# Patient Record
Sex: Female | Born: 1985
Health system: Southern US, Community
[De-identification: ages and names within clinical notes are randomized; demographics above are authoritative.]

## PROBLEM LIST (undated history)

## (undated) ENCOUNTER — Inpatient Hospital Stay (HOSPITAL_COMMUNITY): Payer: Self-pay

## (undated) DIAGNOSIS — T1491XA Suicide attempt, initial encounter: Secondary | ICD-10-CM

## (undated) DIAGNOSIS — H469 Unspecified optic neuritis: Secondary | ICD-10-CM

## (undated) DIAGNOSIS — R87619 Unspecified abnormal cytological findings in specimens from cervix uteri: Secondary | ICD-10-CM

## (undated) DIAGNOSIS — R51 Headache: Secondary | ICD-10-CM

## (undated) DIAGNOSIS — F32A Depression, unspecified: Secondary | ICD-10-CM

## (undated) DIAGNOSIS — J40 Bronchitis, not specified as acute or chronic: Secondary | ICD-10-CM

## (undated) DIAGNOSIS — F419 Anxiety disorder, unspecified: Secondary | ICD-10-CM

## (undated) DIAGNOSIS — F329 Major depressive disorder, single episode, unspecified: Secondary | ICD-10-CM

## (undated) DIAGNOSIS — K219 Gastro-esophageal reflux disease without esophagitis: Secondary | ICD-10-CM

## (undated) DIAGNOSIS — F17200 Nicotine dependence, unspecified, uncomplicated: Secondary | ICD-10-CM

## (undated) DIAGNOSIS — F1411 Cocaine abuse, in remission: Secondary | ICD-10-CM

## (undated) DIAGNOSIS — O34219 Maternal care for unspecified type scar from previous cesarean delivery: Secondary | ICD-10-CM

## (undated) DIAGNOSIS — M1990 Unspecified osteoarthritis, unspecified site: Secondary | ICD-10-CM

## (undated) DIAGNOSIS — Z332 Encounter for elective termination of pregnancy: Secondary | ICD-10-CM

## (undated) HISTORY — PX: COLPOSCOPY: SHX161

## (undated) HISTORY — DX: Suicide attempt, initial encounter: T14.91XA

## (undated) HISTORY — DX: Maternal care for unspecified type scar from previous cesarean delivery: O34.219

## (undated) HISTORY — PX: WISDOM TOOTH EXTRACTION: SHX21

---
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
## 2005-01-24 ENCOUNTER — Emergency Department (HOSPITAL_COMMUNITY): Admission: EM | Admit: 2005-01-24 | Discharge: 2005-01-24 | Payer: Self-pay | Admitting: Emergency Medicine

## 2007-11-27 ENCOUNTER — Emergency Department (HOSPITAL_COMMUNITY): Admission: EM | Admit: 2007-11-27 | Discharge: 2007-11-27 | Payer: Self-pay | Admitting: Emergency Medicine

## 2008-06-05 ENCOUNTER — Ambulatory Visit (HOSPITAL_COMMUNITY): Admission: RE | Admit: 2008-06-05 | Discharge: 2008-06-05 | Payer: Self-pay | Admitting: Obstetrics and Gynecology

## 2008-06-26 ENCOUNTER — Ambulatory Visit (HOSPITAL_COMMUNITY): Admission: RE | Admit: 2008-06-26 | Discharge: 2008-06-26 | Payer: Self-pay | Admitting: Obstetrics and Gynecology

## 2008-07-09 ENCOUNTER — Inpatient Hospital Stay (HOSPITAL_COMMUNITY): Admission: AD | Admit: 2008-07-09 | Discharge: 2008-07-09 | Payer: Self-pay | Admitting: Obstetrics and Gynecology

## 2008-07-24 ENCOUNTER — Ambulatory Visit (HOSPITAL_COMMUNITY): Admission: RE | Admit: 2008-07-24 | Discharge: 2008-07-24 | Payer: Self-pay | Admitting: Obstetrics and Gynecology

## 2008-07-29 ENCOUNTER — Inpatient Hospital Stay (HOSPITAL_COMMUNITY): Admission: AD | Admit: 2008-07-29 | Discharge: 2008-07-29 | Payer: Self-pay | Admitting: Obstetrics and Gynecology

## 2008-07-31 ENCOUNTER — Inpatient Hospital Stay (HOSPITAL_COMMUNITY): Admission: RE | Admit: 2008-07-31 | Discharge: 2008-08-02 | Payer: Self-pay | Admitting: Obstetrics and Gynecology

## 2008-07-31 ENCOUNTER — Encounter (INDEPENDENT_AMBULATORY_CARE_PROVIDER_SITE_OTHER): Payer: Self-pay | Admitting: Obstetrics and Gynecology

## 2008-12-26 ENCOUNTER — Emergency Department (HOSPITAL_COMMUNITY): Admission: EM | Admit: 2008-12-26 | Discharge: 2008-12-26 | Payer: Self-pay | Admitting: Emergency Medicine

## 2009-01-28 ENCOUNTER — Emergency Department (HOSPITAL_COMMUNITY): Admission: EM | Admit: 2009-01-28 | Discharge: 2009-01-28 | Payer: Self-pay | Admitting: Emergency Medicine

## 2009-02-06 ENCOUNTER — Emergency Department (HOSPITAL_COMMUNITY): Admission: EM | Admit: 2009-02-06 | Discharge: 2009-02-06 | Payer: Self-pay | Admitting: Emergency Medicine

## 2010-06-13 ENCOUNTER — Emergency Department (HOSPITAL_COMMUNITY)
Admission: EM | Admit: 2010-06-13 | Discharge: 2010-06-13 | Payer: Self-pay | Source: Home / Self Care | Admitting: Family Medicine

## 2010-08-07 ENCOUNTER — Emergency Department (HOSPITAL_COMMUNITY)
Admission: EM | Admit: 2010-08-07 | Discharge: 2010-08-07 | Payer: Self-pay | Source: Home / Self Care | Admitting: Emergency Medicine

## 2010-08-07 LAB — CBC
HCT: 43.9 % (ref 36.0–46.0)
Hemoglobin: 15.7 g/dL — ABNORMAL HIGH (ref 12.0–15.0)
MCH: 30.1 pg (ref 26.0–34.0)
MCHC: 35.8 g/dL (ref 30.0–36.0)
MCV: 84.1 fL (ref 78.0–100.0)
Platelets: 191 10*3/uL (ref 150–400)
RBC: 5.22 MIL/uL — ABNORMAL HIGH (ref 3.87–5.11)
RDW: 13.2 % (ref 11.5–15.5)
WBC: 5.6 10*3/uL (ref 4.0–10.5)

## 2010-08-07 LAB — POCT INFECTIOUS MONO SCREEN: Mono Screen: NEGATIVE

## 2010-08-07 LAB — POCT RAPID STREP A (OFFICE): Streptococcus, Group A Screen (Direct): NEGATIVE

## 2010-08-08 LAB — DIFFERENTIAL
Basophils Absolute: 0 10*3/uL (ref 0.0–0.1)
Basophils Relative: 0 % (ref 0–1)
Eosinophils Absolute: 0.1 10*3/uL (ref 0.0–0.7)
Eosinophils Relative: 1 % (ref 0–5)
Lymphocytes Relative: 22 % (ref 12–46)
Lymphs Abs: 1.2 10*3/uL (ref 0.7–4.0)
Monocytes Absolute: 0.6 10*3/uL (ref 0.1–1.0)
Monocytes Relative: 11 % (ref 3–12)
Neutro Abs: 3.7 10*3/uL (ref 1.7–7.7)
Neutrophils Relative %: 66 % (ref 43–77)

## 2010-08-10 ENCOUNTER — Emergency Department (HOSPITAL_COMMUNITY)
Admission: EM | Admit: 2010-08-10 | Discharge: 2010-08-10 | Payer: Self-pay | Source: Home / Self Care | Admitting: Family Medicine

## 2010-11-10 LAB — URINALYSIS, ROUTINE W REFLEX MICROSCOPIC
Bilirubin Urine: NEGATIVE
Glucose, UA: NEGATIVE mg/dL
Ketones, ur: NEGATIVE mg/dL
Leukocytes, UA: NEGATIVE
Nitrite: NEGATIVE
Protein, ur: NEGATIVE mg/dL
Specific Gravity, Urine: 1.013 (ref 1.005–1.030)
Urobilinogen, UA: 0.2 mg/dL (ref 0.0–1.0)
pH: 6.5 (ref 5.0–8.0)

## 2010-11-10 LAB — PREGNANCY, URINE: Preg Test, Ur: NEGATIVE

## 2010-11-10 LAB — URINE MICROSCOPIC-ADD ON

## 2010-11-10 LAB — RAPID STREP SCREEN (MED CTR MEBANE ONLY): Streptococcus, Group A Screen (Direct): NEGATIVE

## 2010-11-11 LAB — COMPREHENSIVE METABOLIC PANEL
ALT: 18 U/L (ref 0–35)
AST: 28 U/L (ref 0–37)
Albumin: 3.3 g/dL — ABNORMAL LOW (ref 3.5–5.2)
Alkaline Phosphatase: 95 U/L (ref 39–117)
BUN: 10 mg/dL (ref 6–23)
CO2: 24 mEq/L (ref 19–32)
Calcium: 8.7 mg/dL (ref 8.4–10.5)
Chloride: 108 mEq/L (ref 96–112)
Creatinine, Ser: 0.72 mg/dL (ref 0.4–1.2)
GFR calc Af Amer: >60 mL/min (ref >60)
GFR calc non Af Amer: >60 mL/min (ref >60)
Glucose, Bld: 98 mg/dL (ref 70–99)
Potassium: 4.3 mEq/L (ref 3.5–5.1)
Sodium: 137 mEq/L (ref 135–145)
Total Bilirubin: 1.3 mg/dL — ABNORMAL HIGH (ref 0.3–1.2)
Total Protein: 5.8 g/dL — ABNORMAL LOW (ref 6.0–8.3)

## 2010-11-11 LAB — RAPID URINE DRUG SCREEN, HOSP PERFORMED
Amphetamines: NOT DETECTED
Barbiturates: NOT DETECTED
Benzodiazepines: NOT DETECTED
Cocaine: POSITIVE — AB
Opiates: NOT DETECTED
Tetrahydrocannabinol: POSITIVE — AB

## 2010-11-11 LAB — DIFFERENTIAL
Basophils Absolute: 0 10*3/uL (ref 0.0–0.1)
Basophils Relative: 1 % (ref 0–1)
Eosinophils Absolute: 0.2 10*3/uL (ref 0.0–0.7)
Eosinophils Relative: 2 % (ref 0–5)
Lymphocytes Relative: 29 % (ref 12–46)
Lymphs Abs: 2.2 10*3/uL (ref 0.7–4.0)
Monocytes Absolute: 0.5 10*3/uL (ref 0.1–1.0)
Monocytes Relative: 7 % (ref 3–12)
Neutro Abs: 4.6 10*3/uL (ref 1.7–7.7)
Neutrophils Relative %: 61 % (ref 43–77)

## 2010-11-11 LAB — GLUCOSE, CAPILLARY: Glucose-Capillary: 101 mg/dL — ABNORMAL HIGH (ref 70–99)

## 2010-11-11 LAB — URINALYSIS, ROUTINE W REFLEX MICROSCOPIC
Bilirubin Urine: NEGATIVE
Glucose, UA: NEGATIVE mg/dL
Hgb urine dipstick: NEGATIVE
Ketones, ur: NEGATIVE mg/dL
Nitrite: NEGATIVE
Protein, ur: NEGATIVE mg/dL
Specific Gravity, Urine: 1.015 (ref 1.005–1.030)
Urobilinogen, UA: 0.2 mg/dL (ref 0.0–1.0)
pH: 7.5 (ref 5.0–8.0)

## 2010-11-11 LAB — POCT CARDIAC MARKERS
CKMB, poc: <1 ng/mL — ABNORMAL LOW (ref 1.0–8.0)
CKMB, poc: <1 ng/mL — ABNORMAL LOW (ref 1.0–8.0)
Myoglobin, poc: 50.6 ng/mL (ref 12–200)
Myoglobin, poc: 65.6 ng/mL (ref 12–200)
Troponin i, poc: 0.07 ng/mL (ref 0.00–0.09)
Troponin i, poc: <0.05 ng/mL (ref 0.00–0.09)

## 2010-11-11 LAB — CBC
HCT: 36.7 % (ref 36.0–46.0)
Hemoglobin: 12.1 g/dL (ref 12.0–15.0)
MCHC: 33 g/dL (ref 30.0–36.0)
MCV: 73.9 fL — ABNORMAL LOW (ref 78.0–100.0)
Platelets: 245 10*3/uL (ref 150–400)
RBC: 4.97 MIL/uL (ref 3.87–5.11)
RDW: 18.4 % — ABNORMAL HIGH (ref 11.5–15.5)
WBC: 7.5 10*3/uL (ref 4.0–10.5)

## 2010-11-11 LAB — ETHANOL: Alcohol, Ethyl (B): <5 mg/dL (ref 0–10)

## 2010-11-11 LAB — POCT PREGNANCY, URINE: Preg Test, Ur: NEGATIVE

## 2010-11-12 LAB — RAPID STREP SCREEN (MED CTR MEBANE ONLY): Streptococcus, Group A Screen (Direct): NEGATIVE

## 2010-12-03 ENCOUNTER — Inpatient Hospital Stay (INDEPENDENT_AMBULATORY_CARE_PROVIDER_SITE_OTHER)
Admission: RE | Admit: 2010-12-03 | Discharge: 2010-12-03 | Disposition: A | Discharge status: Home / Self Care | Payer: Medicaid Other | Source: Ambulatory Visit | Attending: Family Medicine | Admitting: Family Medicine

## 2010-12-03 DIAGNOSIS — N390 Urinary tract infection, site not specified: Secondary | ICD-10-CM

## 2010-12-03 DIAGNOSIS — J069 Acute upper respiratory infection, unspecified: Secondary | ICD-10-CM

## 2010-12-03 LAB — POCT URINALYSIS DIP (DEVICE)
Bilirubin Urine: NEGATIVE
Glucose, UA: NEGATIVE mg/dL
Ketones, ur: NEGATIVE mg/dL
Nitrite: NEGATIVE
Protein, ur: NEGATIVE mg/dL
Specific Gravity, Urine: 1.015 (ref 1.005–1.030)
Urobilinogen, UA: 0.2 mg/dL (ref 0.0–1.0)
pH: 6.5 (ref 5.0–8.0)

## 2010-12-17 NOTE — Op Note (Signed)
NAME:  Cynthia Odonnell, Cynthia Odonnell NO.:  [id number]   MEDICAL RECORD NO.:  [id number]          PATIENT TYPE:  INP   LOCATION:  9117                          FACILITY:  [facility]   PHYSICIAN:  Huel Cote, M.D. DATE OF BIRTH:  1985-12-15   DATE OF PROCEDURE:  07/31/2008  DATE OF DISCHARGE:                               OPERATIVE REPORT   PREOPERATIVE DIAGNOSES:  1. Term pregnancy at 39 plus weeks.  2. Fetal intolerance to labor with repetitive decelerations.   POSTOPERATIVE DIAGNOSIS:  1. Term pregnancy at 39 plus weeks.  2. Fetal intolerance to labor with repetitive decelerations.  3. Occult nuchal cord.  4. 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   PROCEDURES:  1. Primary low transverse C-section with double layer closure of      uterus.  2. A right ovarian cystectomy.   SURGEON:  Huel Cote, MD   ANESTHESIA:  Epidural with a local 0.25% Marcaine block.   FINDINGS:  A viable female infant in the vertex presentation, Apgars were  8 and 9, weight was 7 pounds 14 ounces.  There was an occult nuchal cord  noted x1.  Placenta was normal.  The right ovary was noted at its distal  end to have approximately 1-cm area that looked consistent with a  dermoid.  This was opened with Bovie cautery with a thick sebaceous  material noted.  At this point, the cyst itself was clamped with Kelly  clamps and completely ligated and handed off to Pathology.  Again, it  comprised no more than approximately 1 cm in diameter off the distal end  of the ovary.  This was suture ligated with 3-0 Vicryl with good  hemostasis noted.  Careful attention was taken to avoid the distal end  of the fallopian tube, which was in nearby proximity, but appeared  normal.  The left ovary and tube were inspected and found to be normal  and this was handed off to Pathology.   ESTIMATED BLOOD LOSS:  800 mL.   URINE OUTPUT:  600 mL clear.   IV FLUIDS:  2000 mL LR.   DESCRIPTION OF PROCEDURE:  The patient  was taken to the operating room  where epidural anesthesia was found be adequate by Allis clamp test due  to some anxiety on the patient's part and an increased sensation of  pressure.  She was also injected with a 0.25% local block on her skin,  which had good result.  An incision was made and the patient had no pain  whatsoever and this was carried down through a rather thick subcutaneous  layer to the underlying fascia.  The fascia was incised in the midline  and extended laterally with Mayo scissors.  The inferior aspect was  grasped with Kocher clamps, elevated, and dissected off the underlying  rectus muscles.  Likewise, the superior aspect was dissected off the  rectus muscles.  These were separated in the midline and the peritoneal  cavity entered bluntly.  The peritoneal incision was then extended  superiorly and inferiorly with careful attention to avoid both bowel and  bladder.  A blunt stretch was performed and the lower uterine segment  was exposed.  The Alexis self-retaining wound retractor was placed  within the incision and the lower uterine segment was visualized well.  A bladder flap was created with Metzenbaum scissors and the lower  uterine segment was then incised in the transverse fashion and the  cavity itself entered bluntly.  This incision was extended bluntly and  the infant's head was delivered atraumatically and bulb suctioned.  The  remainder of the infant's head and body was delivered without  difficulty.  The cord clamped and cut and the infant handed to awaiting  pediatrician.  As noted, there was an occult nuchal cord, otherwise no  other pathology was noted.  The placenta was delivered and handed off to  Pathology.  After cord blood obtained and given that the baby's Apgars  were good, no cord pH was obtained.  The uterus was cleared of all clots  and debris with moist lap sponge.  The incision was then closed in 2  layers.  The first a running locked  layer of 0 chromic with second an  imbricating layer of the same suture.  The ovaries and tubes were  inspected and found the right ovary to have the distal end with a small  1 cm dermoid-appearing growth off the end just adjacent to the tube.  This was opened with Bovie cautery and a thick sebaceous material was  noted consistent with a dermoid.  Therefore, it was clamped off with 2  slightly curved Kelly clamps and dissected away with Metzenbaum  scissors.  This was handed off to Pathology and the remaining clamps  suture ligated with 3-0 Vicryl with good hemostasis noted.  The left  ovary and tube were inspected and found to be normal as stated.  Attention was returned to the incision where several areas of oozing  were treated with Bovie cautery.  There was one area along the left  serosa, which was suture ligated with 3-0 Vicryl and treated with Bovie  cautery with good hemostasis noted.  After all of these areas of oozing  were controlled, irrigation was performed and no active bleeding noted.  The ovary was again inspected with no bleeding noted.  Therefore, all  instruments and sponges were removed from the patient's abdomen and the  rectus muscles were reapproximated spontaneously.  The subfascial planes  were inspected and hemostasis controlled with Bovie cautery.  The fascia  was then closed with a double loop PDS suture and the subcutaneous  tissue was then closed with 0 plain in a running suture and the skin was  closed with staples.  Sponge, lap, and needle counts were correct x2 and  the patient was taken to the recovery room in stable condition.      Huel Cote, M.D.  Electronically Signed     KR/MEDQ  D:  07/31/2008  T:  08/01/2008  Job:  [id number]

## 2010-12-20 NOTE — Discharge Summary (Signed)
NAME:  BOB, EASTWOOD NO.:  [id number]   MEDICAL RECORD NO.:  [id number]          PATIENT TYPE:  INP   LOCATION:  9117                          FACILITY:  [facility]   PHYSICIAN:  Huel Cote, M.D. DATE OF BIRTH:  [birth year]   DATE OF ADMISSION:  07/31/2008  DATE OF DISCHARGE:  08/02/2008                               DISCHARGE SUMMARY   DISCHARGE DIAGNOSES:  1. Term pregnancy at 39 plus weeks.  2. Fetal intolerance to labor with repetitive decelerations.  3. Status post primary low transverse cesarean section with double      layer closure of uterus.  4. Right ovarian cystectomy for probable dermoid cyst.   DISCHARGE MEDICATIONS:  1. Motrin 600 mg p.o. every 6 hours.  2. Percocet 1 to 2 tablets p.o. every 4 hours p.r.n.   DISCHARGE FOLLOWUP:  The patient is to follow up in the office in 3 days  for staple removal and again in 2 weeks for an incision check.   HOSPITAL COURSE:  The patient is a 25 year old G3, P0-0-2-0 who is  admitted at 22 plus weeks for induction of labor at term given  increasing blood pressures for several weeks.  The patient did not have  any signs or symptoms of preeclampsia.  Her labs were normal, and she  had no proteinuria.  However, her blood pressures had persistently been  elevated into the 140s-150s/90s-100 range and as she was favorable, it  was felt best to effect delivery.  Her prenatal care had been  complicated by history of anxiety and possible bipolar disorder.  She  was stable on Celexa 20 mg daily.  She also had a history of cocaine  use, but had been clean for greater than 1 year from that.  She admitted  to marijuana use x1 early in pregnancy, and was a smoker of tobacco,  decreasing in pregnancy to half a pack a day.   PRENATAL LABS:  O positive, antibody negative, RPR nonreactive, rubella  immune, hepatitis B surface antigen negative, HIV negative, GC negative,  Chlamydia negative.  One-hour Glucola high, 3-hour  Glucola normal.  First trimester screen normal.  Group B strep negative and the patient  had several drug screens, which were also negative in pregnancy.   PAST OBSTETRICAL HISTORY:  She had 2 elective abortions.   PAST GYN HISTORY:  History of abnormal Pap smears and a possible right  dermoid noted on her 12-week ultrasound.   PAST MEDICAL HISTORY:  History of anxiety and possible bipolar disorder  as well as a remote history of cocaine use, but clean greater than 1  year.   PAST SURGICAL HISTORY:  None.   MEDICATIONS:  Celexa 20 mg p.o. daily and a rare use of Darvocet for  back pain at the end of pregnancy.   ALLERGIES:  AUGMENTIN.   On admission, her blood pressure is 140-150/80s-90s.  Fetal heart rate  was reactive.  Cervix was 52+ and -1 station.  She had rupture of  membranes performed with clear fluid noted and was uncomfortable with  contractions.  She did have internal  monitors placed as she had  difficulty measuring and monitoring the fetal heart rate and  contractions externally due to her body habitus.  She did receive her  epidural shortly after rupture of membranes and progressed to 4 cm.  She  began to have variable fetal decelerations at that point, however, did  have scalp stim and good variability, so she continued to labor.  I was  called to see the patient for repetitive decelerations again 3-4 hours  after the initial onset.  She began to have variable decelerations down  to 70s at times, that only improved temporarily after amnioinfusion.  She also had a long deceleration for 3-4 minutes to 90-100 with slow  recovery and her pitocin was off.  Cervix at this point was only 6+ and  80% effaced with a -1 station.  Every time the pitocin was restarted,  the baby began to have decelerations again, therefore the patient was  counseled due to fetal intolerance of labor.  It was probably best to  proceed with cesarean section.  She agreed and underwent a primary  low  transverse C-section for fetal indications and was delivered a viable  female infant with an occult nuchal cord x1.  Weight was 7 pounds 14  ounces.  Apgars were 8 and 9.  She also had a right ovary with a distal  1 cm palpable dermoid, which was removed at the time of cesarean section  and sent to pathology.  She tolerated the surgery well and was admitted  for routine postoperative care.  On postop day #1, hemoglobin was 9.3,  blood pressures were improved to 130s/80s.  Her incision looked well.  By postop day #2, she was requesting early discharge.  She was voiding  well, tolerating regular diet, afebrile with stable vital signs.  Her  incision was clear, and she was felt stable for discharge home to follow  up in the office in several days for staple removal.      Huel Cote, M.D.  Electronically Signed     KR/MEDQ  D:  09/02/2008  T:  09/03/2008  Job:  [id number]

## 2010-12-20 NOTE — Discharge Summary (Signed)
[facility]  Patient:    Cynthia Odonnell, Cynthia Odonnell Visit Number: [id number] MRN: [id number]          Service Type: PSY Location: 100 [id number] [id number] Attending Physician:  Veneta Penton. Dictated by:   Jasmine Pang, M.D. Admit Date:  11/09/2001 Discharge Date: 11/13/2001   CC:         Dr. Joesph Fillers, [facility]   Discharge Summary  BRIEF REASON FOR ADMISSION:  The patient was a 25 year old female who was admitted complaining of depression status post overdose with 15 500-mg Tylenol and 11 325-mg aspirin tablets.  She reports becoming increasingly depressed with an irritable and angry mood over the past several months.  In additional, she has had a number of neurovegetative symptoms including decreased school performance, decreased concentration, anergia, anhedonia, feelings of hopelessness, helplessness, worthlessness and psychomotor agitation.  She has also had decreased appetite and insomnia.  She was not felt able to be safe to be managed in a less restrictive setting.  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  She has had a longstanding history of conduct disorder including episodes of shoplifting.  SUBSTANCE ABUSE HISTORY:  Extensive.  She uses cannabis on a weekly basis and has done so for at least the past six weeks.  She binge drinks alcohol and states this occurs most weekends.  No other history of street drug use available at the time of admission.  PAST MEDICAL HISTORY:  Obesity and exercise-induced asthma.  ALLERGIES:  No known drug allergies.  CURRENT MEDICATIONS:  She takes no prescribed medications at this time.  PHYSICAL EXAMINATION:  No acute medical problems as assessed by Beatris Si. Adams, P.A.C.  LABORATORY DATA:  These were not in the chart at the time of dictation.  HOSPITAL COURSE:  Upon admission, patient was started on Effexor XR 37.5 mg p.o. q.a.m.  She was also begun on a  Nicoderm patch 14 mg q.a.m. to be removed at night at bedtime due to her cigarette dependence.  On November 12, 2001, Effexor was increased to 75 mg p.o. q.a.m.  The patient tolerated this medication well with no significant side effects.  She was able to participate appropriately in unit therapeutic groups and activities.  Her mental status improved.  Mood became less depressed.  Affect was brighter with no suicidal or homicidal ideation.  No aggressive tendencies.  There was no psychosis or perceptual disturbance.  Thought processes were logical and goal directed. Cognitive exam was within normal limits.  At the time of discharge, she was felt able to be managed safely in a less restrictive setting.  DISCHARGE DIAGNOSES: Axis I:    1. Major depression, single episode, severe without psychosis.            2. Conduct disorder.            3. Polysubstance abuse. Axis II:   None. Axis III:  1. Obesity.            2. Asthma. Axis IV:   Current stressors severe. Axis V:    Global Assessment of Functioning upon admission 20; upon discharge            45; highest past year 65.  DISCHARGE MEDICATIONS:  Effexor XR 75 mg p.o. q.a.m.  ACTIVITY LEVEL:  No restrictions.  DIET:  No restrictions.  POST-HOSPITAL CARE PLANS:  Follow-up therapy at the [facility] with Dr. Ladona Ridgel and Abel Presto. Dictated by:  Jasmine Pang, M.D. Attending Physician:  Veneta Penton DD:  12/08/01 TD:  12/08/01 Job: [id number]

## 2011-01-27 ENCOUNTER — Emergency Department (HOSPITAL_BASED_OUTPATIENT_CLINIC_OR_DEPARTMENT_OTHER)
Admission: EM | Admit: 2011-01-27 | Discharge: 2011-01-27 | Disposition: A | Discharge status: Home / Self Care | Payer: Medicaid Other | Attending: Emergency Medicine | Admitting: Emergency Medicine

## 2011-01-27 DIAGNOSIS — F319 Bipolar disorder, unspecified: Secondary | ICD-10-CM | POA: Insufficient documentation

## 2011-01-27 DIAGNOSIS — R21 Rash and other nonspecific skin eruption: Secondary | ICD-10-CM | POA: Insufficient documentation

## 2011-01-27 DIAGNOSIS — R51 Headache: Secondary | ICD-10-CM | POA: Insufficient documentation

## 2011-04-29 LAB — URINALYSIS, ROUTINE W REFLEX MICROSCOPIC
Bilirubin Urine: NEGATIVE
Glucose, UA: NEGATIVE
Hgb urine dipstick: NEGATIVE
Specific Gravity, Urine: 1.027
pH: 6

## 2011-04-29 LAB — WET PREP, GENITAL: Yeast Wet Prep HPF POC: NONE SEEN

## 2011-04-29 LAB — POCT PREGNANCY, URINE
Operator id: 294341
Preg Test, Ur: POSITIVE

## 2011-05-09 LAB — CBC
HCT: 26.1 % — ABNORMAL LOW (ref 36.0–46.0)
Hemoglobin: 10.1 g/dL — ABNORMAL LOW (ref 12.0–15.0)
Hemoglobin: 12.3 g/dL (ref 12.0–15.0)
Hemoglobin: 9.3 g/dL — ABNORMAL LOW (ref 12.0–15.0)
MCHC: 34.7 g/dL (ref 30.0–36.0)
MCV: 92.6 fL (ref 78.0–100.0)
MCV: 92.8 fL (ref 78.0–100.0)
Platelets: 159 10*3/uL (ref 150–400)
RBC: 2.84 MIL/uL — ABNORMAL LOW (ref 3.87–5.11)
RBC: 3.15 MIL/uL — ABNORMAL LOW (ref 3.87–5.11)
RBC: 3.79 MIL/uL — ABNORMAL LOW (ref 3.87–5.11)
RBC: 3.95 MIL/uL (ref 3.87–5.11)
RDW: 13.9 % (ref 11.5–15.5)
RDW: 14 % (ref 11.5–15.5)
WBC: 12.8 10*3/uL — ABNORMAL HIGH (ref 4.0–10.5)

## 2011-05-09 LAB — URINALYSIS, ROUTINE W REFLEX MICROSCOPIC
Bilirubin Urine: NEGATIVE
Bilirubin Urine: NEGATIVE
Glucose, UA: NEGATIVE mg/dL
Glucose, UA: NEGATIVE mg/dL
Glucose, UA: NEGATIVE mg/dL
Hgb urine dipstick: NEGATIVE
Ketones, ur: NEGATIVE mg/dL
Ketones, ur: NEGATIVE mg/dL
Leukocytes, UA: NEGATIVE
Nitrite: NEGATIVE
Specific Gravity, Urine: 1.015 (ref 1.005–1.030)
Specific Gravity, Urine: <1.005 — ABNORMAL LOW (ref 1.005–1.030)
Urobilinogen, UA: 0.2 mg/dL (ref 0.0–1.0)
pH: 6.5 (ref 5.0–8.0)
pH: 7 (ref 5.0–8.0)
pH: 7 (ref 5.0–8.0)

## 2011-05-09 LAB — COMPREHENSIVE METABOLIC PANEL
ALT: 14 U/L (ref 0–35)
AST: 16 U/L (ref 0–37)
Alkaline Phosphatase: 111 U/L (ref 39–117)
CO2: 24 mEq/L (ref 19–32)
Calcium: 7.8 mg/dL — ABNORMAL LOW (ref 8.4–10.5)
Calcium: 8.9 mg/dL (ref 8.4–10.5)
Creatinine, Ser: 0.61 mg/dL (ref 0.4–1.2)
GFR calc Af Amer: >60 mL/min (ref >60)
GFR calc Af Amer: >60 mL/min (ref >60)
GFR calc non Af Amer: >60 mL/min (ref >60)
Glucose, Bld: 90 mg/dL (ref 70–99)
Glucose, Bld: 97 mg/dL (ref 70–99)
Potassium: 3.3 mEq/L — ABNORMAL LOW (ref 3.5–5.1)
Sodium: 135 mEq/L (ref 135–145)
Total Protein: 4.4 g/dL — ABNORMAL LOW (ref 6.0–8.3)
Total Protein: 6 g/dL (ref 6.0–8.3)

## 2011-05-09 LAB — RPR: RPR Ser Ql: NONREACTIVE

## 2011-05-09 LAB — URINE MICROSCOPIC-ADD ON

## 2011-05-09 LAB — LACTATE DEHYDROGENASE: LDH: 133 U/L (ref 94–250)

## 2011-05-09 LAB — URIC ACID: Uric Acid, Serum: 4.9 mg/dL (ref 2.4–7.0)

## 2011-05-30 ENCOUNTER — Inpatient Hospital Stay (INDEPENDENT_AMBULATORY_CARE_PROVIDER_SITE_OTHER)
Admission: RE | Admit: 2011-05-30 | Discharge: 2011-05-30 | Disposition: A | Discharge status: Home / Self Care | Payer: Medicaid Other | Source: Ambulatory Visit | Attending: Family Medicine | Admitting: Family Medicine

## 2011-05-30 DIAGNOSIS — J310 Chronic rhinitis: Secondary | ICD-10-CM

## 2011-05-30 DIAGNOSIS — J029 Acute pharyngitis, unspecified: Secondary | ICD-10-CM

## 2011-05-30 DIAGNOSIS — J019 Acute sinusitis, unspecified: Secondary | ICD-10-CM

## 2011-07-02 ENCOUNTER — Emergency Department (HOSPITAL_BASED_OUTPATIENT_CLINIC_OR_DEPARTMENT_OTHER)
Admission: EM | Admit: 2011-07-02 | Discharge: 2011-07-02 | Disposition: A | Discharge status: Home / Self Care | Payer: Medicaid Other | Attending: Emergency Medicine | Admitting: Emergency Medicine

## 2011-07-02 ENCOUNTER — Encounter: Payer: Self-pay | Admitting: Emergency Medicine

## 2011-07-02 DIAGNOSIS — L02219 Cutaneous abscess of trunk, unspecified: Secondary | ICD-10-CM | POA: Insufficient documentation

## 2011-07-02 DIAGNOSIS — L0291 Cutaneous abscess, unspecified: Secondary | ICD-10-CM

## 2011-07-02 MED ORDER — DOXYCYCLINE HYCLATE 100 MG PO CAPS: 100.0000 mg | ORAL_CAPSULE | Freq: Two times a day (BID) | ORAL | Status: AC

## 2011-07-02 MED ORDER — LIDOCAINE HCL (PF) 1 % IJ SOLN
5.0000 mL | Freq: Once | INTRAMUSCULAR | Status: AC
  Administered 2011-07-02: 5 mL via INTRADERMAL
  Filled 2011-07-02: qty 5

## 2011-07-02 MED ORDER — IBUPROFEN 600 MG PO TABS: 600.0000 mg | ORAL_TABLET | Freq: Four times a day (QID) | ORAL | Status: AC | PRN

## 2011-07-02 NOTE — ED Notes (Signed)
Site intact with sterile drsg

## 2011-07-02 NOTE — ED Provider Notes (Signed)
History     CSN: [id number] Arrival date & time: 07/02/2011  8:12 AM   First MD Initiated Contact with Patient 07/02/11 0818      Chief Complaint  Patient presents with  . Recurrent Skin Infections    abcess to pubic area with drainage   HPI  25yo female history of abscess in the past presents with concern for abscess. The patient states that she shaved her pubic area. Subsequently yesterday afternoon she noticed a red raised area. She did notice pus drainage yesterday. States that today he continues to be red, raised, and painful. She denies fever, chills. She denies abdominal pain, nausea, vomiting. She denies back pain.   History reviewed. No pertinent past medical history.  Past Surgical History  Procedure Date  . Ceacerian     History reviewed. No pertinent family history.  History  Substance Use Topics  . Smoking status: Current Some Day Smoker  . Smokeless tobacco: Not on file  . Alcohol Use: No    OB History    Grav Para Term Preterm Abortions TAB SAB Ect Mult Living                  Review of Systems  All other systems reviewed and are negative.   except as noted HPI   Allergies  Augmentin  Home Medications   Current Outpatient Rx  Name Route Sig Dispense Refill  . ACETAZOLAMIDE 500 MG PO CP12 Oral Take 500 mg by mouth 2 (two) times daily.      Marland Kitchen FLUOXETINE HCL 10 MG PO CAPS Oral Take 10 mg by mouth daily.      . TRAZODONE HCL 100 MG PO TABS Oral Take 100 mg by mouth at bedtime.      Marland Kitchen DOXYCYCLINE HYCLATE 100 MG PO CAPS Oral Take 1 capsule (100 mg total) by mouth 2 (two) times daily. 20 capsule 0  . IBUPROFEN 600 MG PO TABS Oral Take 1 tablet (600 mg total) by mouth every 6 (six) hours as needed for pain. 30 tablet 0    BP 125/67  Pulse 77  Temp(Src) 97.8 F (36.6 C) (Oral)  Resp 21  SpO2 100%  LMP 05/21/2011  Physical Exam  Nursing note and vitals reviewed. Constitutional: She is oriented to person, place, and time. She appears  well-developed.  HENT:  Head: Atraumatic.  Mouth/Throat: Oropharynx is clear and moist.  Eyes: Conjunctivae and EOM are normal. Pupils are equal, round, and reactive to light.  Neck: Normal range of motion. Neck supple.  Cardiovascular: Normal rate, regular rhythm, normal heart sounds and intact distal pulses.   Pulmonary/Chest: Effort normal and breath sounds normal. No respiratory distress. She has no wheezes. She has no rales.  Abdominal: Soft. She exhibits no distension. There is no tenderness. There is no rebound and no guarding.  Genitourinary:       Pubic mons with 1cm x cm area of erythema with min fluctuance, min induration  Musculoskeletal: Normal range of motion.  Neurological: She is alert and oriented to person, place, and time.  Skin: Skin is warm and dry. No rash noted.  Psychiatric: She has a normal mood and affect.    ED Course  INCISION AND DRAINAGE Date/Time: 07/02/2011 8:39 AM Performed by: Forbes Cellar Authorized by: Forbes Cellar Consent: Verbal consent obtained. Patient understanding: patient states understanding of the procedure being performed Patient consent: the patient's understanding of the procedure matches consent given Patient identity confirmed: verbally with patient and arm band Type:  abscess Body area: anogenital Anesthesia: local infiltration Local anesthetic: lidocaine 1% without epinephrine Anesthetic total: 2 ml Patient sedated: no Scalpel size: 11 Incision type: single straight Complexity: simple Drainage: purulent Drainage amount: scant Wound treatment: wound left open Packing material: 1/4 in iodoform gauze Patient tolerance: Patient tolerated the procedure well with no immediate complications.   (including critical care time)  Labs Reviewed - No data to display No results found.   1. Abscess       MDM  S/p I &D. Small amt of packing in place. Abx. Recheck 2 days.  Stefano Gaul, MD         Forbes Cellar, MD 07/02/11 [phone]

## 2011-07-02 NOTE — ED Notes (Signed)
Red raised boil to top of pubic area 1 inch in diamter painful to Touch

## 2012-01-24 ENCOUNTER — Emergency Department: Payer: Self-pay | Admitting: Emergency Medicine

## 2012-01-24 LAB — URINALYSIS, COMPLETE
Bilirubin,UR: NEGATIVE
Leukocyte Esterase: NEGATIVE
Protein: 30
Specific Gravity: 1.017 (ref 1.003–1.030)
Squamous Epithelial: 10
WBC UR: 6 /HPF (ref 0–5)

## 2012-01-24 LAB — CBC
HCT: 46.6 % (ref 35.0–47.0)
HGB: 15.6 g/dL (ref 12.0–16.0)
MCH: 29.6 pg (ref 26.0–34.0)
MCHC: 33.4 g/dL (ref 32.0–36.0)
Platelet: 246 10*3/uL (ref 150–440)
WBC: 9.2 10*3/uL (ref 3.6–11.0)

## 2012-01-24 LAB — COMPREHENSIVE METABOLIC PANEL
Albumin: 3.5 g/dL (ref 3.4–5.0)
Alkaline Phosphatase: 101 U/L (ref 50–136)
BUN: 11 mg/dL (ref 7–18)
Calcium, Total: 8.6 mg/dL (ref 8.5–10.1)
Co2: 19 mmol/L — ABNORMAL LOW (ref 21–32)
Creatinine: 1.07 mg/dL (ref 0.60–1.30)
EGFR (African American): >60
Glucose: 95 mg/dL (ref 65–99)
SGPT (ALT): 24 U/L
Sodium: 141 mmol/L (ref 136–145)
Total Protein: 6.8 g/dL (ref 6.4–8.2)

## 2012-01-24 LAB — PREGNANCY, URINE: Pregnancy Test, Urine: NEGATIVE m[IU]/mL

## 2012-01-24 LAB — LIPASE, BLOOD: Lipase: 127 U/L (ref 73–393)

## 2012-01-25 ENCOUNTER — Inpatient Hospital Stay (HOSPITAL_COMMUNITY)
Admission: AD | Admit: 2012-01-25 | Discharge: 2012-01-25 | Disposition: A | Discharge status: Home / Self Care | Payer: BC Managed Care – PPO | Source: Ambulatory Visit | Attending: Obstetrics and Gynecology | Admitting: Obstetrics and Gynecology

## 2012-01-25 ENCOUNTER — Encounter (HOSPITAL_COMMUNITY): Payer: Self-pay

## 2012-01-25 ENCOUNTER — Inpatient Hospital Stay (HOSPITAL_COMMUNITY): Payer: BC Managed Care – PPO

## 2012-01-25 DIAGNOSIS — M545 Low back pain, unspecified: Secondary | ICD-10-CM | POA: Insufficient documentation

## 2012-01-25 DIAGNOSIS — R1032 Left lower quadrant pain: Secondary | ICD-10-CM | POA: Insufficient documentation

## 2012-01-25 DIAGNOSIS — R109 Unspecified abdominal pain: Secondary | ICD-10-CM

## 2012-01-25 DIAGNOSIS — E860 Dehydration: Secondary | ICD-10-CM | POA: Insufficient documentation

## 2012-01-25 DIAGNOSIS — S39012A Strain of muscle, fascia and tendon of lower back, initial encounter: Secondary | ICD-10-CM

## 2012-01-25 HISTORY — DX: Unspecified optic neuritis: H46.9

## 2012-01-25 HISTORY — DX: Headache: R51

## 2012-01-25 LAB — URINE MICROSCOPIC-ADD ON

## 2012-01-25 LAB — URINALYSIS, ROUTINE W REFLEX MICROSCOPIC
Glucose, UA: NEGATIVE mg/dL
Ketones, ur: NEGATIVE mg/dL
Protein, ur: NEGATIVE mg/dL
Urobilinogen, UA: 0.2 mg/dL (ref 0.0–1.0)

## 2012-01-25 LAB — CBC
MCH: 30.9 pg (ref 26.0–34.0)
MCHC: 36.3 g/dL — ABNORMAL HIGH (ref 30.0–36.0)
Platelets: 219 10*3/uL (ref 150–400)
RDW: 12.9 % (ref 11.5–15.5)

## 2012-01-25 LAB — WET PREP, GENITAL
Clue Cells Wet Prep HPF POC: NONE SEEN
Trich, Wet Prep: NONE SEEN

## 2012-01-25 MED ORDER — IBUPROFEN 600 MG PO TABS: 600.0000 mg | ORAL_TABLET | Freq: Four times a day (QID) | ORAL | Status: AC | PRN

## 2012-01-25 MED ORDER — OXYCODONE-ACETAMINOPHEN 5-325 MG PO TABS: 1.0000 | ORAL_TABLET | Freq: Four times a day (QID) | ORAL | Status: DC | PRN

## 2012-01-25 MED ORDER — OXYCODONE-ACETAMINOPHEN 5-325 MG PO TABS
1.0000 | ORAL_TABLET | Freq: Once | ORAL | Status: AC
  Administered 2012-01-25: 2 via ORAL
  Filled 2012-01-25: qty 2

## 2012-01-25 MED ORDER — CYCLOBENZAPRINE HCL 5 MG PO TABS: 5.0000 mg | ORAL_TABLET | Freq: Three times a day (TID) | ORAL | Status: AC | PRN

## 2012-01-25 NOTE — Discharge Instructions (Signed)
Dehydration, Adult Dehydration is when you lose more fluids from the body than you take in. Vital organs like the kidneys, brain, and heart cannot function without a proper amount of fluids and salt. Any loss of fluids from the body can cause dehydration.  CAUSES   Vomiting.   Diarrhea.   Excessive sweating.   Excessive urine output.   Fever.  SYMPTOMS  Mild dehydration  Thirst.   Dry lips.   Slightly dry mouth.  Moderate dehydration  Very dry mouth.   Sunken eyes.   Skin does not bounce back quickly when lightly pinched and released.   Dark urine and decreased urine production.   Decreased tear production.   Headache.  Severe dehydration  Very dry mouth.   Extreme thirst.   Rapid, weak pulse (more than 100 beats per minute at rest).   Cold hands and feet.   Not able to sweat in spite of heat and temperature.   Rapid breathing.   Blue lips.   Confusion and lethargy.   Difficulty being awakened.   Minimal urine production.   No tears.  DIAGNOSIS  Your caregiver will diagnose dehydration based on your symptoms and your exam. Blood and urine tests will help confirm the diagnosis. The diagnostic evaluation should also identify the cause of dehydration. TREATMENT  Treatment of mild or moderate dehydration can often be done at home by increasing the amount of fluids that you drink. It is best to drink small amounts of fluid more often. Drinking too much at one time can make vomiting worse. Refer to the home care instructions below. Severe dehydration needs to be treated at the hospital where you will probably be given intravenous (IV) fluids that contain water and electrolytes. HOME CARE INSTRUCTIONS   Ask your caregiver about specific rehydration instructions.   Drink enough fluids to keep your urine clear or pale yellow.   Drink small amounts frequently if you have nausea and vomiting.   Eat as you normally do.   Avoid:   Foods or drinks high in  sugar.   Carbonated drinks.   Juice.   Extremely hot or cold fluids.   Drinks with caffeine.   Fatty, greasy foods.   Alcohol.   Tobacco.   Overeating.   Gelatin desserts.   Wash your hands well to avoid spreading bacteria and viruses.   Only take over-the-counter or prescription medicines for pain, discomfort, or fever as directed by your caregiver.   Ask your caregiver if you should continue all prescribed and over-the-counter medicines.   Keep all follow-up appointments with your caregiver.  SEEK MEDICAL CARE IF:  You have abdominal pain and it increases or stays in one area (localizes).   You have a rash, stiff neck, or severe headache.   You are irritable, sleepy, or difficult to awaken.   You are weak, dizzy, or extremely thirsty.  SEEK IMMEDIATE MEDICAL CARE IF:   You are unable to keep fluids down or you get worse despite treatment.   You have frequent episodes of vomiting or diarrhea.   You have blood or green matter (bile) in your vomit.   You have blood in your stool or your stool looks black and tarry.   You have not urinated in 6 to 8 hours, or you have only urinated a small amount of very dark urine.   You have a fever.   You faint.  MAKE SURE YOU:   Understand these instructions.   Will watch your condition.     Will get help right away if you are not doing well or get worse.  Document Released: 07/21/2005 Document Revised: 07/10/2011 Document Reviewed: 03/10/2011 [facility] Patient Information 2012 [place], [place].Dehydration, Adult Dehydration is when you lose more fluids from the body than you take in. Vital organs like the kidneys, brain, and heart cannot function without a proper amount of fluids and salt. Any loss of fluids from the body can cause dehydration.  CAUSES   Vomiting.   Diarrhea.   Excessive sweating.   Excessive urine output.   Fever.  SYMPTOMS  Mild dehydration  Thirst.   Dry lips.   Slightly dry mouth.    Moderate dehydration  Very dry mouth.   Sunken eyes.   Skin does not bounce back quickly when lightly pinched and released.   Dark urine and decreased urine production.   Decreased tear production.   Headache.  Severe dehydration  Very dry mouth.   Extreme thirst.   Rapid, weak pulse (more than 100 beats per minute at rest).   Cold hands and feet.   Not able to sweat in spite of heat and temperature.   Rapid breathing.   Blue lips.   Confusion and lethargy.   Difficulty being awakened.   Minimal urine production.   No tears.  DIAGNOSIS  Your caregiver will diagnose dehydration based on your symptoms and your exam. Blood and urine tests will help confirm the diagnosis. The diagnostic evaluation should also identify the cause of dehydration. TREATMENT  Treatment of mild or moderate dehydration can often be done at home by increasing the amount of fluids that you drink. It is best to drink small amounts of fluid more often. Drinking too much at one time can make vomiting worse. Refer to the home care instructions below. Severe dehydration needs to be treated at the hospital where you will probably be given intravenous (IV) fluids that contain water and electrolytes. HOME CARE INSTRUCTIONS   Ask your caregiver about specific rehydration instructions.   Drink enough fluids to keep your urine clear or pale yellow.   Drink small amounts frequently if you have nausea and vomiting.   Eat as you normally do.   Avoid:   Foods or drinks high in sugar.   Carbonated drinks.   Juice.   Extremely hot or cold fluids.   Drinks with caffeine.   Fatty, greasy foods.   Alcohol.   Tobacco.   Overeating.   Gelatin desserts.   Wash your hands well to avoid spreading bacteria and viruses.   Only take over-the-counter or prescription medicines for pain, discomfort, or fever as directed by your caregiver.   Ask your caregiver if you should continue all prescribed  and over-the-counter medicines.   Keep all follow-up appointments with your caregiver.  SEEK MEDICAL CARE IF:  You have abdominal pain and it increases or stays in one area (localizes).   You have a rash, stiff neck, or severe headache.   You are irritable, sleepy, or difficult to awaken.   You are weak, dizzy, or extremely thirsty.  SEEK IMMEDIATE MEDICAL CARE IF:   You are unable to keep fluids down or you get worse despite treatment.   You have frequent episodes of vomiting or diarrhea.   You have blood or green matter (bile) in your vomit.   You have blood in your stool or your stool looks black and tarry.   You have not urinated in 6 to 8 hours, or you have only urinated a small amount  of very dark urine.   You have a fever.   You faint.  MAKE SURE YOU:   Understand these instructions.   Will watch your condition.   Will get help right away if you are not doing well or get worse.  Document Released: 07/21/2005 Document Revised: 07/10/2011 Document Reviewed: 03/10/2011 [facility] Patient Information 2012 [place], [place].Dehydration, Adult Dehydration is when you lose more fluids from the body than you take in. Vital organs like the kidneys, brain, and heart cannot function without a proper amount of fluids and salt. Any loss of fluids from the body can cause dehydration.  CAUSES   Vomiting.   Diarrhea.   Excessive sweating.   Excessive urine output.   Fever.  SYMPTOMS  Mild dehydration  Thirst.   Dry lips.   Slightly dry mouth.  Moderate dehydration  Very dry mouth.   Sunken eyes.   Skin does not bounce back quickly when lightly pinched and released.   Dark urine and decreased urine production.   Decreased tear production.   Headache.  Severe dehydration  Very dry mouth.   Extreme thirst.   Rapid, weak pulse (more than 100 beats per minute at rest).   Cold hands and feet.   Not able to sweat in spite of heat and temperature.   Rapid  breathing.   Blue lips.   Confusion and lethargy.   Difficulty being awakened.   Minimal urine production.   No tears.  DIAGNOSIS  Your caregiver will diagnose dehydration based on your symptoms and your exam. Blood and urine tests will help confirm the diagnosis. The diagnostic evaluation should also identify the cause of dehydration. TREATMENT  Treatment of mild or moderate dehydration can often be done at home by increasing the amount of fluids that you drink. It is best to drink small amounts of fluid more often. Drinking too much at one time can make vomiting worse. Refer to the home care instructions below. Severe dehydration needs to be treated at the hospital where you will probably be given intravenous (IV) fluids that contain water and electrolytes. HOME CARE INSTRUCTIONS   Ask your caregiver about specific rehydration instructions.   Drink enough fluids to keep your urine clear or pale yellow.   Drink small amounts frequently if you have nausea and vomiting.   Eat as you normally do.   Avoid:   Foods or drinks high in sugar.   Carbonated drinks.   Juice.   Extremely hot or cold fluids.   Drinks with caffeine.   Fatty, greasy foods.   Alcohol.   Tobacco.   Overeating.   Gelatin desserts.   Wash your hands well to avoid spreading bacteria and viruses.   Only take over-the-counter or prescription medicines for pain, discomfort, or fever as directed by your caregiver.   Ask your caregiver if you should continue all prescribed and over-the-counter medicines.   Keep all follow-up appointments with your caregiver.  SEEK MEDICAL CARE IF:  You have abdominal pain and it increases or stays in one area (localizes).   You have a rash, stiff neck, or severe headache.   You are irritable, sleepy, or difficult to awaken.   You are weak, dizzy, or extremely thirsty.  SEEK IMMEDIATE MEDICAL CARE IF:   You are unable to keep fluids down or you get worse  despite treatment.   You have frequent episodes of vomiting or diarrhea.   You have blood or green matter (bile) in your vomit.   You have  blood in your stool or your stool looks black and tarry.   You have not urinated in 6 to 8 hours, or you have only urinated a small amount of very dark urine.   You have a fever.   You faint.  MAKE SURE YOU:   Understand these instructions.   Will watch your condition.   Will get help right away if you are not doing well or get worse.  Document Released: 07/21/2005 Document Revised: 07/10/2011 Document Reviewed: 03/10/2011 [facility] Patient Information 2012 [place], [place].

## 2012-01-25 NOTE — MAU Note (Signed)
Pt reports having a pain in her LLQ that is shooting down leg and to her back pain started on Wed. Pt seen in Urgent care today and referred for r/o ruptureed ovarian cyst Suzzette Righter

## 2012-01-25 NOTE — MAU Provider Note (Signed)
Cynthia Odonnell 21  Chief Complaint  Patient presents with  . Abdominal Pain     First Provider Initiated Contact with Patient 01/25/12 1704      SUBJECTIVE  HPI:  She she began a menstrual period 5 days ago and since then has had left lower quadrant and low back pain which radiates at times down the back of her left leg. She states the pain is with position changes and movement, occurs over 10 times a day, last about 1 minute and is sharp in character. She's tried ibuprofen 200mg   up to 6 tablets a day without relief.  She usually just as light spotting with her monthly menstrual cycle since being on Mirena. She has an appointment 02/02/2012 for removal of the Mirena as she would like to attempt pregnancy. She denies irritative vaginal discharge. She declines GC/chlamydia testing. Normal BM yesterday did not affect pain. She was seen at urgent care today for this problem however states they did not do a pelvic exam and advised her to come here telling her she may have a ruptured ovarian cyst.  Past Medical History  Diagnosis Date  . Optic neuritis   . Headache    Past Surgical History  Procedure Date  . Ceacerian   . Cesarean section    History   Social History  . Marital Status: Single    Spouse Name: N/A    Number of Children: N/A  . Years of Education: N/A   Occupational History  . Not on file.   Social History Main Topics  . Smoking status: Current Some Day Smoker  . Smokeless tobacco: Not on file  . Alcohol Use: No  . Drug Use: No  . Sexually Active: Yes    Birth Control/ Protection: IUD   Other Topics Concern  . Not on file   Social History Narrative  . No narrative on file  Smoker No current facility-administered medications on file prior to encounter.   Current Outpatient Prescriptions on File Prior to Encounter  Medication Sig Dispense Refill  . acetaZOLAMIDE (DIAMOX) 500 MG capsule Take 500 mg by mouth 2 (two) times daily.        Marland Kitchen albuterol  (PROVENTIL HFA;VENTOLIN HFA) 108 (90 BASE) MCG/ACT inhaler Inhale 2 puffs into the lungs every 6 (six) hours as needed. For shortness of breath      . FLUoxetine (PROZAC) 10 MG capsule Take 10 mg by mouth daily.        . traZODone (DESYREL) 100 MG tablet Take 100 mg by mouth at bedtime.         Allergies  Allergen Reactions  . Amoxicillin Hives    ROS: Pertinent items in HPI  OBJECTIVE Blood pressure 122/80, pulse 69, temperature 98.8 F (37.1 C), temperature source Oral, resp. rate 18, height 5\' 6"  (1.676 m), weight 116.937 kg (257 lb 12.8 oz).  GENERAL: Obese female in no acute distress.  HEENT: Normocephalic, good dentition HEART: normal rate RESP: normal effort ABDOMEN: Soft, tender to palpation left suprapubic and left groin region BACK: negative for CVA tenderness; tender to palpation over spare spinous muscles in lumbar sacral region EXTREMITIES: Nontender, no edema NEURO: Alert and oriented SPECULUM EXAM: NEFG, menstrual-like blood noted, cervix clean with IUD strings in place BIMANUAL: cervix post; uterus unable to outline due to body habitus, Diffuse tenderness with bimanual exam particularly left adnexal region  LAB RESULTS  Results for orders placed during the hospital encounter of 01/25/12 (from the past 24 hour(s))  POCT PREGNANCY, URINE     Status: Normal   Collection Time   01/25/12  4:04 PM      Component Value Range   Preg Test, Ur NEGATIVE  NEGATIVE  WET PREP, GENITAL     Status: Abnormal   Collection Time   01/25/12  4:15 PM      Component Value Range   Yeast Wet Prep HPF POC NONE SEEN  NONE SEEN   Trich, Wet Prep NONE SEEN  NONE SEEN   Clue Cells Wet Prep HPF POC NONE SEEN  NONE SEEN   WBC, Wet Prep HPF POC FEW (*) NONE SEEN  URINALYSIS, ROUTINE W REFLEX MICROSCOPIC     Status: Abnormal   Collection Time   01/25/12  4:18 PM      Component Value Range   Color, Urine YELLOW  YELLOW   APPearance HAZY (*) CLEAR   Specific Gravity, Urine 1.025  1.005 -  1.030   pH 5.5  5.0 - 8.0   Glucose, UA NEGATIVE  NEGATIVE mg/dL   Hgb urine dipstick LARGE (*) NEGATIVE   Bilirubin Urine NEGATIVE  NEGATIVE   Ketones, ur NEGATIVE  NEGATIVE mg/dL   Protein, ur NEGATIVE  NEGATIVE mg/dL   Urobilinogen, UA 0.2  0.0 - 1.0 mg/dL   Nitrite NEGATIVE  NEGATIVE   Leukocytes, UA NEGATIVE  NEGATIVE  URINE MICROSCOPIC-ADD ON     Status: Abnormal   Collection Time   01/25/12  4:18 PM      Component Value Range   Squamous Epithelial / LPF FEW (*) RARE   WBC, UA 0-2  <3 WBC/hpf   RBC / HPF 3-6  <3 RBC/hpf   Bacteria, UA FEW (*) RARE  CBC     Status: Abnormal   Collection Time   01/25/12  6:40 PM      Component Value Range   WBC 10.7 (*) 4.0 - 10.5 K/uL   RBC 5.15 (*) 3.87 - 5.11 MIL/uL   Hemoglobin 15.9 (*) 12.0 - 15.0 g/dL   HCT 14.7  82.9 - 56.2 %   MCV 85.0  78.0 - 100.0 fL   MCH 30.9  26.0 - 34.0 pg   MCHC 36.3 (*) 30.0 - 36.0 g/dL   RDW 13.0  86.5 - 78.4 %   Platelets 219  150 - 400 K/uL    IMAGING TRANSABDOMINAL AND TRANSVAGINAL ULTRASOUND OF PELVIS  Technique: Both transabdominal and transvaginal ultrasound  examinations of the pelvis were performed. Transabdominal technique  was performed for global imaging of the pelvis including uterus,  ovaries, adnexal regions, and pelvic cul-de-sac.  It was necessary to proceed with endovaginal exam following the  transabdominal exam to visualize the uterus endometrium and adnexa.  Comparison: OB ultrasound and 07/24/1989.  Findings:  Uterus: Normal measuring 0.8 x 3.8 x 4.5 cm.  Endometrium: Echogenic linear IUD in place. Endometrial thickness  is not evident.  Right ovary: Normal measuring 2.8 x 1.4 x 1.5 cm.  Left ovary: Not visible despite repeated transabdominal and  transvaginal attempts. The left ovary also was not well visualized  in 2009.  Other findings: No pelvic free fluid.  IMPRESSION:  IUD in place. No evidence of pelvic mass or other significant  abnormality. Left ovary not visualized  despite repeated imaging  attempts, also poorly visible in 2009.  Original Report Authenticated By: Harley Hallmark, M.D.            External Result Report     External Result Report  Imaging     Imaging Information            Signed by       Signed  Date/Time    Phone  Pager    Elvera Maria  01/25/2012  6:36 PM EDT  [phone]  [phone]         ASSESSMENT  1. Dehydration   2. Low back strain   3. Abdominal pain     PLAN C/W Dr. Ambrose Mantle. Discussed results and POC Rx Flexeril and Ibuprofen 600mg  po q6h. F/U CBC at [facility] appt on 02/02/12. Back precautions given. Increase fluids     Cynthia Odonnell 01/25/2012 5:29 PM

## 2012-01-25 NOTE — MAU Note (Signed)
Patient states that she has an appointment on 7/1 for removal of IUD as her and her boyfriend want to have a baby.

## 2013-04-28 ENCOUNTER — Emergency Department (HOSPITAL_BASED_OUTPATIENT_CLINIC_OR_DEPARTMENT_OTHER): Payer: Medicaid Other

## 2013-04-28 ENCOUNTER — Emergency Department (HOSPITAL_BASED_OUTPATIENT_CLINIC_OR_DEPARTMENT_OTHER)
Admission: EM | Admit: 2013-04-28 | Discharge: 2013-04-28 | Disposition: A | Discharge status: Home / Self Care | Payer: Medicaid Other | Attending: Emergency Medicine | Admitting: Emergency Medicine

## 2013-04-28 ENCOUNTER — Encounter (HOSPITAL_BASED_OUTPATIENT_CLINIC_OR_DEPARTMENT_OTHER): Payer: Self-pay | Admitting: Student

## 2013-04-28 DIAGNOSIS — Y9389 Activity, other specified: Secondary | ICD-10-CM | POA: Insufficient documentation

## 2013-04-28 DIAGNOSIS — Y929 Unspecified place or not applicable: Secondary | ICD-10-CM | POA: Insufficient documentation

## 2013-04-28 DIAGNOSIS — Z3202 Encounter for pregnancy test, result negative: Secondary | ICD-10-CM | POA: Insufficient documentation

## 2013-04-28 DIAGNOSIS — S335XXA Sprain of ligaments of lumbar spine, initial encounter: Secondary | ICD-10-CM | POA: Insufficient documentation

## 2013-04-28 DIAGNOSIS — W06XXXA Fall from bed, initial encounter: Secondary | ICD-10-CM | POA: Insufficient documentation

## 2013-04-28 DIAGNOSIS — Z8669 Personal history of other diseases of the nervous system and sense organs: Secondary | ICD-10-CM | POA: Insufficient documentation

## 2013-04-28 DIAGNOSIS — S39012A Strain of muscle, fascia and tendon of lower back, initial encounter: Secondary | ICD-10-CM

## 2013-04-28 DIAGNOSIS — Z79899 Other long term (current) drug therapy: Secondary | ICD-10-CM | POA: Insufficient documentation

## 2013-04-28 DIAGNOSIS — S3981XA Other specified injuries of abdomen, initial encounter: Secondary | ICD-10-CM | POA: Insufficient documentation

## 2013-04-28 DIAGNOSIS — F17200 Nicotine dependence, unspecified, uncomplicated: Secondary | ICD-10-CM | POA: Insufficient documentation

## 2013-04-28 LAB — PREGNANCY, URINE: Preg Test, Ur: NEGATIVE

## 2013-04-28 MED ORDER — PREDNISONE 10 MG PO TABS: ORAL_TABLET | ORAL | Status: DC

## 2013-04-28 MED ORDER — HYDROCODONE-ACETAMINOPHEN 5-325 MG PO TABS: 2.0000 | ORAL_TABLET | ORAL | Status: DC | PRN

## 2013-04-28 NOTE — ED Notes (Signed)
Pt in with c/o lower back pain s/p fall out of bed 5 weeks ago, unrelieved by OTC naproxen.

## 2013-04-28 NOTE — ED Provider Notes (Signed)
CSN: [id number]     Arrival date & time 04/28/13  1257 History   First MD Initiated Contact with Patient 04/28/13 1312     Chief Complaint  Patient presents with  . Back Pain    lower back pain s/p fall 5 weeks ago   (Consider location/radiation/quality/duration/timing/severity/associated sxs/prior Treatment) Patient is a 27 y.o. female presenting with back pain. The history is provided by the patient. No language interpreter was used.  Back Pain Location:  Lumbar spine Quality:  Aching Radiates to:  Does not radiate Pain severity:  Moderate Pain is:  Worse during the day Onset quality:  Gradual Duration:  5 weeks Timing:  Constant Progression:  Worsening Chronicity:  New Relieved by:  Nothing Ineffective treatments:  None tried Associated symptoms: abdominal pain   Pt reports she hurt her back 5 weeks ago.  Pt fell off of the bed and hit her back.    Past Medical History  Diagnosis Date  . Optic neuritis   . [id number](478.2)    Past Surgical History  Procedure Laterality Date  . Ceacerian    . Cesarean section     Family History  Problem Relation Age of Onset  . Other Neg Hx    History  Substance Use Topics  . Smoking status: Current Some Day Smoker  . Smokeless tobacco: Not on file  . Alcohol Use: No   OB History   Grav Para Term Preterm Abortions TAB SAB Ect Mult Living   3 1 1  2 2    1      Review of Systems  Gastrointestinal: Positive for abdominal pain.  Musculoskeletal: Positive for back pain.  All other systems reviewed and are negative.    Allergies  Amoxicillin  Home Medications   Current Outpatient Rx  Name  Route  Sig  Dispense  Refill  . acetaZOLAMIDE (DIAMOX) 500 MG capsule   Oral   Take 500 mg by mouth 2 (two) times daily.           Marland Kitchen albuterol (PROVENTIL HFA;VENTOLIN HFA) 108 (90 BASE) MCG/ACT inhaler   Inhalation   Inhale 2 puffs into the lungs every 6 (six) hours as needed. For shortness of breath         . FLUoxetine  (PROZAC) 10 MG capsule   Oral   Take 10 mg by mouth daily.           . traZODone (DESYREL) 100 MG tablet   Oral   Take 100 mg by mouth at bedtime.            BP 135/79  Pulse 95  Temp(Src) 98.1 F (36.7 C) (Oral)  Resp 20  Wt 262 lb (118.842 kg)  BMI 42.31 kg/m2  SpO2 100%  LMP 03/31/2013 Physical Exam  Nursing note and vitals reviewed. Constitutional: She appears well-developed and well-nourished.  HENT:  Head: Normocephalic and atraumatic.  Right Ear: External ear normal.  Left Ear: External ear normal.  Mouth/Throat: Oropharynx is clear and moist.  Eyes: Pupils are equal, round, and reactive to light.  Cardiovascular: Normal rate.   Pulmonary/Chest: Effort normal.  Musculoskeletal:  Tender ls spine diffusely,  From,  nv and ns intact  Neurological: She is alert.  Skin: Skin is warm.  Psychiatric: She has a normal mood and affect.    ED Course  Procedures (including critical care time) Labs Review Labs Reviewed  PREGNANCY, URINE   Imaging Review Dg Lumbar Spine Complete  04/28/2013   CLINICAL DATA:  Pain  post trauma  EXAM: LUMBAR SPINE - COMPLETE 4+ VIEW  COMPARISON:  None.  FINDINGS: Frontal, lateral, spot lumbosacral lateral, and bilateral oblique views were obtained. There are 5 non-rib-bearing lumbar type vertebral bodies. There is lower lumbar levoscoliosis. There is no fracture or spondylolisthesis. Disk spaces appear intact. There is no appreciable facet arthropathy.  IMPRESSION: Mild scoliosis. No fracture or appreciable arthropathic change.   Electronically Signed   By: Bretta Bang   On: 04/28/2013 14:21    MDM   1. Lumbar strain, initial encounter     Xray no abnormality,  Pt started on prednisone and given hydrocodone for pain.   I advised follow up with Dr. Pearletha Forge for recheck   Elson Areas, PA-C 04/28/13 1441

## 2013-04-29 NOTE — ED Provider Notes (Signed)
Medical screening examination/treatment/procedure(s) were performed by non-physician practitioner and as supervising physician I was immediately available for consultation/collaboration.   Delvonte Berenson, MD 04/29/13 1047 

## 2013-05-05 ENCOUNTER — Encounter (HOSPITAL_BASED_OUTPATIENT_CLINIC_OR_DEPARTMENT_OTHER): Payer: Self-pay

## 2013-05-05 ENCOUNTER — Emergency Department (HOSPITAL_BASED_OUTPATIENT_CLINIC_OR_DEPARTMENT_OTHER)
Admission: EM | Admit: 2013-05-05 | Discharge: 2013-05-05 | Disposition: A | Discharge status: Home / Self Care | Payer: Medicaid Other | Attending: Emergency Medicine | Admitting: Emergency Medicine

## 2013-05-05 DIAGNOSIS — Z79899 Other long term (current) drug therapy: Secondary | ICD-10-CM | POA: Insufficient documentation

## 2013-05-05 DIAGNOSIS — F17200 Nicotine dependence, unspecified, uncomplicated: Secondary | ICD-10-CM | POA: Insufficient documentation

## 2013-05-05 DIAGNOSIS — J069 Acute upper respiratory infection, unspecified: Secondary | ICD-10-CM | POA: Insufficient documentation

## 2013-05-05 DIAGNOSIS — M545 Low back pain, unspecified: Secondary | ICD-10-CM | POA: Insufficient documentation

## 2013-05-05 DIAGNOSIS — G8911 Acute pain due to trauma: Secondary | ICD-10-CM | POA: Insufficient documentation

## 2013-05-05 DIAGNOSIS — Z880 Allergy status to penicillin: Secondary | ICD-10-CM | POA: Insufficient documentation

## 2013-05-05 DIAGNOSIS — Z8669 Personal history of other diseases of the nervous system and sense organs: Secondary | ICD-10-CM | POA: Insufficient documentation

## 2013-05-05 DIAGNOSIS — IMO0002 Reserved for concepts with insufficient information to code with codable children: Secondary | ICD-10-CM | POA: Insufficient documentation

## 2013-05-05 DIAGNOSIS — S39012S Strain of muscle, fascia and tendon of lower back, sequela: Secondary | ICD-10-CM

## 2013-05-05 MED ORDER — NAPROXEN 500 MG PO TABS: 500.0000 mg | ORAL_TABLET | Freq: Two times a day (BID) | ORAL | Status: DC

## 2013-05-05 MED ORDER — ALBUTEROL SULFATE HFA 108 (90 BASE) MCG/ACT IN AERS: 2.0000 | INHALATION_SPRAY | RESPIRATORY_TRACT | Status: DC | PRN

## 2013-05-05 MED ORDER — CYCLOBENZAPRINE HCL 10 MG PO TABS: 10.0000 mg | ORAL_TABLET | Freq: Two times a day (BID) | ORAL | Status: DC | PRN

## 2013-05-05 MED ORDER — CYCLOBENZAPRINE HCL 10 MG PO TABS
10.0000 mg | ORAL_TABLET | Freq: Once | ORAL | Status: DC
  Filled 2013-05-05: qty 1

## 2013-05-05 MED ORDER — IBUPROFEN 800 MG PO TABS
800.0000 mg | ORAL_TABLET | Freq: Once | ORAL | Status: AC
  Administered 2013-05-05: 800 mg via ORAL
  Filled 2013-05-05: qty 1

## 2013-05-05 NOTE — ED Notes (Signed)
Reports severe lower back pain x2 weeks - 2 falls within the last week, reports it is sacral and radiates to bilateral hips. Also reports cold symptoms - runny nose, congestion, mild sore throat.

## 2013-05-05 NOTE — ED Provider Notes (Addendum)
CSN: [id number]     Arrival date & time 05/05/13  1013 History   First MD Initiated Contact with Patient 05/05/13 1016     Chief Complaint  Patient presents with  . URI  . Back Pain   (Consider location/radiation/quality/duration/timing/severity/associated sxs/prior Treatment) HPI Comments: 27 yo female with smoking hx presents with recurrent congestion and cough for one week and recurrent lower back pain for 2-3 weeks.  Pt has had a few falls the past month landing on buttocks.  She had trial of steroids and norco with mild improvement but ran out of medicines.  Patient denies urinary or bowel changes, active cancer, extremity weakness, IVDU, fevers, immunosuppression or significant trauma.  Pain with movement.  No current abx.  No fevers.     Patient is a 27 y.o. female presenting with URI and back pain. The history is provided by the patient.  URI Presenting symptoms: congestion and cough   Presenting symptoms: no fever   Chronicity:  Recurrent Associated symptoms: no headaches and no neck pain   Back Pain Associated symptoms: no abdominal pain, no chest pain, no dysuria, no fever, no headaches and no weakness     Past Medical History  Diagnosis Date  . Optic neuritis   . NWGNFAOZ(308.6)    Past Surgical History  Procedure Laterality Date  . Ceacerian    . Cesarean section     Family History  Problem Relation Age of Onset  . Other Neg Hx    History  Substance Use Topics  . Smoking status: Current Some Day Smoker  . Smokeless tobacco: Not on file  . Alcohol Use: No   OB History   Grav Para Term Preterm Abortions TAB SAB Ect Mult Living   3 1 1  2 2    1      Review of Systems  Constitutional: Negative for fever and chills.  HENT: Positive for congestion. Negative for neck pain and neck stiffness.   Respiratory: Positive for cough. Negative for shortness of breath.   Cardiovascular: Negative for chest pain.  Gastrointestinal: Negative for vomiting and abdominal  pain.  Genitourinary: Negative for dysuria and flank pain.  Musculoskeletal: Positive for back pain. Negative for gait problem.  Skin: Negative for rash.  Neurological: Negative for weakness, light-headedness and headaches.    Allergies  Amoxicillin  Home Medications   Current Outpatient Rx  Name  Route  Sig  Dispense  Refill  . acetaZOLAMIDE (DIAMOX) 500 MG capsule   Oral   Take 500 mg by mouth 2 (two) times daily.           Marland Kitchen albuterol (PROVENTIL HFA;VENTOLIN HFA) 108 (90 BASE) MCG/ACT inhaler   Inhalation   Inhale 2 puffs into the lungs every 6 (six) hours as needed. For shortness of breath         . FLUoxetine (PROZAC) 10 MG capsule   Oral   Take 10 mg by mouth daily.           Marland Kitchen HYDROcodone-acetaminophen (NORCO/VICODIN) 5-325 MG per tablet   Oral   Take 2 tablets by mouth every 4 (four) hours as needed.   20 tablet   0   . predniSONE (DELTASONE) 10 MG tablet      6,5,4,3,2,1 taper   21 tablet   0   . traZODone (DESYREL) 100 MG tablet   Oral   Take 100 mg by mouth at bedtime.            BP 125/74  Pulse 77  Temp(Src) 98.4 F (36.9 C) (Oral)  Resp 20  Ht 5\' 6"  (1.676 m)  Wt 262 lb (118.842 kg)  BMI 42.31 kg/m2  SpO2 98%  LMP 03/31/2013 Physical Exam  Nursing note and vitals reviewed. Constitutional: She is oriented to person, place, and time. She appears well-developed and well-nourished.  HENT:  Head: Normocephalic and atraumatic.  Eyes: Conjunctivae are normal. Right eye exhibits no discharge. Left eye exhibits no discharge.  Neck: Normal range of motion. Neck supple. No tracheal deviation present.  Cardiovascular: Normal rate and regular rhythm.   Pulmonary/Chest: Effort normal and breath sounds normal. No respiratory distress.  Abdominal: Soft. She exhibits no distension. There is no tenderness. There is no guarding.  Musculoskeletal: She exhibits tenderness (sacram and left sacral./ gluteal region). She exhibits no edema.  Neurological:  She is alert and oriented to person, place, and time. No sensory deficit.  Reflex Scores:      Patellar reflexes are 2+ on the right side and 2+ on the left side.      Achilles reflexes are 2+ on the right side and 2+ on the left side. 5+ strength in UE and LE with f/e at major joints. Sensation to palpation intact in UE and LE.    Skin: Skin is warm. No rash noted.  Psychiatric: She has a normal mood and affect.    ED Course  Procedures (including critical care time) Labs Review Labs Reviewed - No data to display Imaging Review No results found.  MDM  No diagnosis found. Well appearing. Discussed wellness center, pt may need PT and guidance for weight control/ exercise routine. Discussed importance of abd muscle strengthening to take load off back. Smoking cessation and outpatient follow up was discussed with the patient for 3 to 5  Minutes.  No red flags. Trial of albuterol.  Muscle relaxant and naproxen.   Reasons to return discussed.  Ibuprofen in ED for pain. DC URI, Lower back strain    Enid Skeens, MD 05/05/13 1034  Enid Skeens, MD 05/05/13 1037

## 2013-11-26 ENCOUNTER — Encounter (HOSPITAL_BASED_OUTPATIENT_CLINIC_OR_DEPARTMENT_OTHER): Payer: Self-pay | Admitting: Emergency Medicine

## 2013-11-26 ENCOUNTER — Emergency Department (HOSPITAL_BASED_OUTPATIENT_CLINIC_OR_DEPARTMENT_OTHER)
Admission: EM | Admit: 2013-11-26 | Discharge: 2013-11-27 | Disposition: A | Discharge status: Home / Self Care | Payer: Medicaid Other | Attending: Emergency Medicine | Admitting: Emergency Medicine

## 2013-11-26 DIAGNOSIS — N898 Other specified noninflammatory disorders of vagina: Secondary | ICD-10-CM | POA: Insufficient documentation

## 2013-11-26 DIAGNOSIS — R102 Pelvic and perineal pain: Secondary | ICD-10-CM

## 2013-11-26 DIAGNOSIS — Z3202 Encounter for pregnancy test, result negative: Secondary | ICD-10-CM | POA: Insufficient documentation

## 2013-11-26 DIAGNOSIS — Z79899 Other long term (current) drug therapy: Secondary | ICD-10-CM | POA: Insufficient documentation

## 2013-11-26 DIAGNOSIS — F17200 Nicotine dependence, unspecified, uncomplicated: Secondary | ICD-10-CM | POA: Insufficient documentation

## 2013-11-26 DIAGNOSIS — Z8669 Personal history of other diseases of the nervous system and sense organs: Secondary | ICD-10-CM | POA: Insufficient documentation

## 2013-11-26 DIAGNOSIS — Z791 Long term (current) use of non-steroidal anti-inflammatories (NSAID): Secondary | ICD-10-CM | POA: Insufficient documentation

## 2013-11-26 DIAGNOSIS — M549 Dorsalgia, unspecified: Secondary | ICD-10-CM | POA: Insufficient documentation

## 2013-11-26 DIAGNOSIS — Z880 Allergy status to penicillin: Secondary | ICD-10-CM | POA: Insufficient documentation

## 2013-11-26 DIAGNOSIS — N949 Unspecified condition associated with female genital organs and menstrual cycle: Secondary | ICD-10-CM | POA: Insufficient documentation

## 2013-11-26 LAB — URINALYSIS, ROUTINE W REFLEX MICROSCOPIC
Bilirubin Urine: NEGATIVE
GLUCOSE, UA: NEGATIVE mg/dL
Hgb urine dipstick: NEGATIVE
Ketones, ur: NEGATIVE mg/dL
LEUKOCYTES UA: NEGATIVE
NITRITE: NEGATIVE
PH: 6 (ref 5.0–8.0)
Protein, ur: NEGATIVE mg/dL
Specific Gravity, Urine: 1.009 (ref 1.005–1.030)
Urobilinogen, UA: 0.2 mg/dL (ref 0.0–1.0)

## 2013-11-26 LAB — PREGNANCY, URINE: PREG TEST UR: NEGATIVE

## 2013-11-26 MED ORDER — FENTANYL CITRATE 0.05 MG/ML IJ SOLN
100.0000 ug | Freq: Once | INTRAMUSCULAR | Status: AC
  Administered 2013-11-27: 100 ug via INTRAVENOUS
  Filled 2013-11-26: qty 2

## 2013-11-26 NOTE — ED Provider Notes (Signed)
CSN: [id number]     Arrival date & time 11/26/13  2252 History   This chart was scribed for Cynthia SeamenJohn L Araly Kaas, MD by Beverly MilchJ Harrison Collins, ED Scribe. This patient was seen in room MH01/MH01 and the patient's care was started at 11:26 PM.    Chief Complaint  Patient presents with  . Abdominal Pain     The history is provided by the patient. No language interpreter was used.   HPI Comments: Cynthia Odonnell is a 28 y.o. female who presents to the Emergency Department complaining of intermittent left sided back pain that began at 8 PM. She states it has migrated to the front near her C-section scar. Pt reports that earlier in the evening her pain was bothering her so badly while driving she was pulled over for driving erratically. She states her back currently doesn't hurt when lying still and the pain is more prominent in her abdomen. She characterizes the pain as a sharp stabbing pain. She states walking, standing up, and general movements exacerbate her pain. She reports associated vaginal discharge and was sweating earlier. Pt denies chills, nausea, vomitting, diarrhea, constipation, vaginal bleeding, dysuria, and hematuria.   Past Medical History  Diagnosis Date  . Optic neuritis   . WUJWJXBJ(478.2Headache(784.0)    Past Surgical History  Procedure Laterality Date  . Ceacerian    . Cesarean section     Family History  Problem Relation Age of Onset  . Other Neg Hx    History  Substance Use Topics  . Smoking status: Current Every Day Smoker  . Smokeless tobacco: Never Used  . Alcohol Use: No   OB History   Grav Para Term Preterm Abortions TAB SAB Ect Mult Living   3 1 1  2 2    1      Review of Systems A complete 10 system review of systems was obtained and all systems are negative except as noted in the HPI and PMH.    Allergies  Amoxicillin; Penicillins; Augmentin; and Tramadol  Home Medications    Prior to Admission medications   Medication Sig Start Date End Date Taking? Authorizing  Provider  albuterol (PROVENTIL HFA;VENTOLIN HFA) 108 (90 BASE) MCG/ACT inhaler Inhale 2 puffs into the lungs every 4 (four) hours as needed for wheezing. 05/05/13  Yes Enid SkeensJoshua M Zavitz, MD  acetaZOLAMIDE (DIAMOX) 500 MG capsule Take 500 mg by mouth 2 (two) times daily.      Historical Provider, MD  albuterol (PROVENTIL HFA;VENTOLIN HFA) 108 (90 BASE) MCG/ACT inhaler Inhale 2 puffs into the lungs every 6 (six) hours as needed. For shortness of breath    Historical Provider, MD  cyclobenzaprine (FLEXERIL) 10 MG tablet Take 1 tablet (10 mg total) by mouth 2 (two) times daily as needed for muscle spasms. 05/05/13   Enid SkeensJoshua M Zavitz, MD  FLUoxetine (PROZAC) 10 MG capsule Take 10 mg by mouth daily.      Historical Provider, MD  HYDROcodone-acetaminophen (NORCO/VICODIN) 5-325 MG per tablet Take 2 tablets by mouth every 4 (four) hours as needed. 04/28/13   Elson AreasLeslie K Sofia, PA-C  naproxen (NAPROSYN) 500 MG tablet Take 1 tablet (500 mg total) by mouth 2 (two) times daily. 05/05/13   Enid SkeensJoshua M Zavitz, MD  predniSONE (DELTASONE) 10 MG tablet 6,5,4,3,2,1 taper 04/28/13   Elson AreasLeslie K Sofia, PA-C  traZODone (DESYREL) 100 MG tablet Take 100 mg by mouth at bedtime.      Historical Provider, MD    Triage Vitals: BP 113/70  Pulse  88  Temp(Src) 98.8 F (37.1 C) (Oral)  Resp 18  Ht 5\' 6"  (1.676 m)  Wt 260 lb (117.935 kg)  BMI 41.99 kg/m2  SpO2 99%  LMP 11/11/2013   Physical Exam  Nursing note and vitals reviewed. General: Well-developed, well-nourished female in no acute distress; appearance consistent with age of record HENT: normocephalic; atraumatic Eyes: pupils equal, round and reactive to light; extraocular muscles intact Neck: supple Heart: regular rate and rhythm; no murmurs, rubs or gallops Lungs: clear to auscultation bilaterally Abdomen: soft; nondistended; Suprapubic tenderness, left paraspinal tenderness, which also causes pain her suprapubic region, it hurts on the right as well, but not as bad as on the  left.; no masses or hepatosplenomegaly; bowel sounds present GU: Normal external genitalia; no appreciable vaginal discharge; no vaginal bleeding; cervical motion tenderness; adnexal tenderness, left greater than right Extremities: No deformity; full range of motion; pulses normal Neurologic: Awake, alert and oriented; motor function intact in all extremities and symmetric; no facial droop Skin: Warm and dry, facial acne Psychiatric: Normal mood and affect    ED Course  Procedures (including critical care time)  DIAGNOSTIC STUDIES: Oxygen Saturation is 99% on RA, normal by my interpretation.    COORDINATION OF CARE: 11:32 PM- Pt advised of plan for treatment and pt agrees.     MDM   Nursing notes and vitals signs, including pulse oximetry, reviewed.  Summary of this visit's results, reviewed by myself:  Labs:  Results for orders placed during the hospital encounter of 11/26/13 (from the past 24 hour(s))  PREGNANCY, URINE     Status: None   Collection Time    11/26/13 11:09 PM      Result Value Ref Range   Preg Test, Ur NEGATIVE  NEGATIVE  URINALYSIS, ROUTINE W REFLEX MICROSCOPIC     Status: Abnormal   Collection Time    11/26/13 11:09 PM      Result Value Ref Range   Color, Urine YELLOW  YELLOW   APPearance CLOUDY (*) CLEAR   Specific Gravity, Urine 1.009  1.005 - 1.030   pH 6.0  5.0 - 8.0   Glucose, UA NEGATIVE  NEGATIVE mg/dL   Hgb urine dipstick NEGATIVE  NEGATIVE   Bilirubin Urine NEGATIVE  NEGATIVE   Ketones, ur NEGATIVE  NEGATIVE mg/dL   Protein, ur NEGATIVE  NEGATIVE mg/dL   Urobilinogen, UA 0.2  0.0 - 1.0 mg/dL   Nitrite NEGATIVE  NEGATIVE   Leukocytes, UA NEGATIVE  NEGATIVE  WET PREP, GENITAL     Status: Abnormal   Collection Time    11/27/13 12:00 AM      Result Value Ref Range   Yeast Wet Prep HPF POC NONE SEEN  NONE SEEN   Trich, Wet Prep NONE SEEN  NONE SEEN   Clue Cells Wet Prep HPF POC NONE SEEN  NONE SEEN   WBC, Wet Prep HPF POC FEW (*) NONE  SEEN   12:39 AM Patient symptoms are concerning for cervicitis early PID but there was no significant discharge and no significant findings on wet prep. We'll treat with antibiotics but have her return later today for a pelvic ultrasound. She does have a history of ovarian cysts and I think that an ovarian cyst or ruptured ovarian cyst is the more likely cause for pain.   I personally performed the services described in this documentation, which was scribed in my presence. The recorded information has been reviewed and is accurate.   Cynthia SeamenJohn L Landrum Carbonell, MD 11/27/13  0040 

## 2013-11-26 NOTE — ED Notes (Signed)
Reports back pain since 8pm now stabbing in lower abdomen- denies dysuria, +vaginal d/c- last BM today

## 2013-11-27 ENCOUNTER — Ambulatory Visit (HOSPITAL_BASED_OUTPATIENT_CLINIC_OR_DEPARTMENT_OTHER)
Admission: RE | Admit: 2013-11-27 | Discharge: 2013-11-27 | Disposition: A | Discharge status: Home / Self Care | Payer: Medicaid Other | Source: Ambulatory Visit | Attending: Emergency Medicine | Admitting: Emergency Medicine

## 2013-11-27 ENCOUNTER — Other Ambulatory Visit (HOSPITAL_BASED_OUTPATIENT_CLINIC_OR_DEPARTMENT_OTHER): Payer: BC Managed Care – PPO

## 2013-11-27 DIAGNOSIS — R109 Unspecified abdominal pain: Secondary | ICD-10-CM | POA: Insufficient documentation

## 2013-11-27 LAB — WET PREP, GENITAL
CLUE CELLS WET PREP: NONE SEEN
Trich, Wet Prep: NONE SEEN
Yeast Wet Prep HPF POC: NONE SEEN

## 2013-11-27 LAB — HIV ANTIBODY (ROUTINE TESTING W REFLEX): HIV 1&2 Ab, 4th Generation: NONREACTIVE

## 2013-11-27 MED ORDER — AZITHROMYCIN 250 MG PO TABS
1000.0000 mg | ORAL_TABLET | Freq: Once | ORAL | Status: AC
  Administered 2013-11-27: 1000 mg via ORAL
  Filled 2013-11-27: qty 4

## 2013-11-27 MED ORDER — CEFTRIAXONE SODIUM 250 MG IJ SOLR
250.0000 mg | Freq: Once | INTRAMUSCULAR | Status: AC
  Administered 2013-11-27: 250 mg via INTRAMUSCULAR
  Filled 2013-11-27: qty 250

## 2013-11-27 MED ORDER — HYDROCODONE-ACETAMINOPHEN 5-325 MG PO TABS: 1.0000 | ORAL_TABLET | Freq: Four times a day (QID) | ORAL | Status: DC | PRN

## 2013-11-28 LAB — GC/CHLAMYDIA PROBE AMP
CT Probe RNA: NEGATIVE
GC Probe RNA: NEGATIVE

## 2014-02-24 ENCOUNTER — Emergency Department (HOSPITAL_BASED_OUTPATIENT_CLINIC_OR_DEPARTMENT_OTHER)
Admission: EM | Admit: 2014-02-24 | Discharge: 2014-02-24 | Disposition: A | Discharge status: Home / Self Care | Payer: Medicaid Other | Attending: Emergency Medicine | Admitting: Emergency Medicine

## 2014-02-24 ENCOUNTER — Emergency Department (HOSPITAL_BASED_OUTPATIENT_CLINIC_OR_DEPARTMENT_OTHER): Payer: Medicaid Other

## 2014-02-24 ENCOUNTER — Encounter (HOSPITAL_BASED_OUTPATIENT_CLINIC_OR_DEPARTMENT_OTHER): Payer: Self-pay | Admitting: Emergency Medicine

## 2014-02-24 DIAGNOSIS — R1110 Vomiting, unspecified: Secondary | ICD-10-CM | POA: Diagnosis not present

## 2014-02-24 DIAGNOSIS — J40 Bronchitis, not specified as acute or chronic: Secondary | ICD-10-CM | POA: Insufficient documentation

## 2014-02-24 DIAGNOSIS — R05 Cough: Secondary | ICD-10-CM | POA: Insufficient documentation

## 2014-02-24 DIAGNOSIS — R059 Cough, unspecified: Secondary | ICD-10-CM | POA: Insufficient documentation

## 2014-02-24 DIAGNOSIS — Z79899 Other long term (current) drug therapy: Secondary | ICD-10-CM | POA: Diagnosis not present

## 2014-02-24 DIAGNOSIS — Z8669 Personal history of other diseases of the nervous system and sense organs: Secondary | ICD-10-CM | POA: Diagnosis not present

## 2014-02-24 DIAGNOSIS — Z880 Allergy status to penicillin: Secondary | ICD-10-CM | POA: Diagnosis not present

## 2014-02-24 DIAGNOSIS — F17200 Nicotine dependence, unspecified, uncomplicated: Secondary | ICD-10-CM | POA: Insufficient documentation

## 2014-02-24 DIAGNOSIS — IMO0002 Reserved for concepts with insufficient information to code with codable children: Secondary | ICD-10-CM | POA: Insufficient documentation

## 2014-02-24 DIAGNOSIS — M549 Dorsalgia, unspecified: Secondary | ICD-10-CM | POA: Diagnosis not present

## 2014-02-24 DIAGNOSIS — Z791 Long term (current) use of non-steroidal anti-inflammatories (NSAID): Secondary | ICD-10-CM | POA: Insufficient documentation

## 2014-02-24 MED ORDER — ALBUTEROL SULFATE HFA 108 (90 BASE) MCG/ACT IN AERS: 1.0000 | INHALATION_SPRAY | Freq: Four times a day (QID) | RESPIRATORY_TRACT | Status: DC | PRN

## 2014-02-24 MED ORDER — PREDNISONE 20 MG PO TABS: ORAL_TABLET | ORAL | Status: DC

## 2014-02-24 MED ORDER — HYDROCOD POLST-CHLORPHEN POLST 10-8 MG/5ML PO LQCR: 5.0000 mL | Freq: Every evening | ORAL | Status: DC | PRN

## 2014-02-24 NOTE — ED Notes (Signed)
Reports cough x 3 days. Sts post tussive emesis.  Reports dry cough.

## 2014-02-24 NOTE — ED Provider Notes (Signed)
CSN: [id number]     Arrival date & time 02/24/14  1723 History  This chart was scribed for No att. providers found by Carl Bestelina Holson, ED Scribe. This patient was seen in room MHOTF/OTF and the patient's care was started at 6:05 PM.      Chief Complaint  Patient presents with  . Cough    Patient is a 28 y.o. female presenting with cough. The history is provided by the patient. No language interpreter was used.  Cough Associated symptoms: chest pain, fever, rhinorrhea and shortness of breath   Associated symptoms: no chills, no diaphoresis, no headaches and no rash    HPI Comments: Cynthia Odonnell is a 28 y.o. female who presents to the Emergency Department complaining of constant dry cough with associated chest congestion that started three days ago.  She lists rhinorrhea, fever, post tussive emesis, back pain with coughing, and mild chest pain with coughing as associated symptoms.  She states that her fever was 101 degrees last night.  The patient denies abdominal pain as an associated symptom.  She denies having a history of asthma.  She states that she will use an inhaler intermittently.  She states that she used her inhaler last night which allowed her to sleep.  She states that her LNMP was July 5 and she is not on birth control.  She states that she is trying to get pregnant.  The patient is a smoker.    Past Medical History  Diagnosis Date  . Optic neuritis   . NFAOZHYQ(657.8Headache(784.0)    Past Surgical History  Procedure Laterality Date  . Ceacerian    . Cesarean section     Family History  Problem Relation Age of Onset  . Other Neg Hx    History  Substance Use Topics  . Smoking status: Current Every Day Smoker  . Smokeless tobacco: Never Used  . Alcohol Use: No   OB History   Grav Para Term Preterm Abortions TAB SAB Ect Mult Living   3 1 1  2 2    1      Review of Systems  Constitutional: Positive for fever. Negative for chills, diaphoresis and fatigue.  HENT: Positive for  congestion and rhinorrhea. Negative for sneezing.   Eyes: Negative.   Respiratory: Positive for cough and shortness of breath. Negative for chest tightness.   Cardiovascular: Positive for chest pain. Negative for leg swelling.  Gastrointestinal: Positive for vomiting. Negative for nausea, abdominal pain, diarrhea and blood in stool.  Genitourinary: Negative for frequency, hematuria, flank pain and difficulty urinating.  Musculoskeletal: Positive for back pain. Negative for arthralgias.  Skin: Negative for rash.  Neurological: Negative for dizziness, speech difficulty, weakness, numbness and headaches.  All other systems reviewed and are negative.     Allergies  Amoxicillin; Penicillins; Augmentin; and Tramadol  Home Medications   Prior to Admission medications   Medication Sig Start Date End Date Taking? Authorizing Provider  acetaZOLAMIDE (DIAMOX) 500 MG capsule Take 500 mg by mouth 2 (two) times daily.      Historical Provider, MD  albuterol (PROVENTIL HFA;VENTOLIN HFA) 108 (90 BASE) MCG/ACT inhaler Inhale 2 puffs into the lungs every 6 (six) hours as needed. For shortness of breath    Historical Provider, MD  albuterol (PROVENTIL HFA;VENTOLIN HFA) 108 (90 BASE) MCG/ACT inhaler Inhale 2 puffs into the lungs every 4 (four) hours as needed for wheezing. 05/05/13   Enid SkeensJoshua M Zavitz, MD  albuterol (PROVENTIL HFA;VENTOLIN HFA) 108 (90 BASE) MCG/ACT inhaler  Inhale 1-2 puffs into the lungs every 6 (six) hours as needed for wheezing or shortness of breath. 02/24/14   Rolan Bucco, MD  chlorpheniramine-HYDROcodone (TUSSIONEX PENNKINETIC ER) 10-8 MG/5ML LQCR Take 5 mLs by mouth at bedtime as needed for cough. 02/24/14   Rolan Bucco, MD  cyclobenzaprine (FLEXERIL) 10 MG tablet Take 1 tablet (10 mg total) by mouth 2 (two) times daily as needed for muscle spasms. 05/05/13   Enid Skeens, MD  FLUoxetine (PROZAC) 10 MG capsule Take 10 mg by mouth daily.      Historical Provider, MD   HYDROcodone-acetaminophen (NORCO/VICODIN) 5-325 MG per tablet Take 2 tablets by mouth every 4 (four) hours as needed. 04/28/13   Elson Areas, PA-C  HYDROcodone-acetaminophen (NORCO/VICODIN) 5-325 MG per tablet Take 1-2 tablets by mouth every 6 (six) hours as needed for moderate pain. 11/27/13   Carlisle Beers Molpus, MD  naproxen (NAPROSYN) 500 MG tablet Take 1 tablet (500 mg total) by mouth 2 (two) times daily. 05/05/13   Enid Skeens, MD  predniSONE (DELTASONE) 10 MG tablet 6,5,4,3,2,1 taper 04/28/13   Elson Areas, PA-C  predniSONE (DELTASONE) 20 MG tablet 3 tabs po day one, then 2 po daily x 4 days 02/24/14   Rolan Bucco, MD  traZODone (DESYREL) 100 MG tablet Take 100 mg by mouth at bedtime.      Historical Provider, MD   Triage Vitals: BP 132/86  Pulse 92  Temp(Src) 98.6 F (37 C) (Oral)  Ht 5\' 6"  (1.676 m)  Wt 247 lb (112.038 kg)  BMI 39.89 kg/m2  SpO2 96%  LMP 02/05/2014  Physical Exam  Constitutional: She is oriented to person, place, and time. She appears well-developed and well-nourished.  HENT:  Head: Normocephalic and atraumatic.  Eyes: Pupils are equal, round, and reactive to light.  Neck: Normal range of motion. Neck supple.  Cardiovascular: Normal rate, regular rhythm and normal heart sounds.   Pulmonary/Chest: Effort normal and breath sounds normal. No respiratory distress. She has no wheezes. She has no rales. She exhibits no tenderness.  Abdominal: Soft. Bowel sounds are normal. There is no tenderness. There is no rebound and no guarding.  Musculoskeletal: Normal range of motion. She exhibits no edema.  Lymphadenopathy:    She has no cervical adenopathy.  Neurological: She is alert and oriented to person, place, and time.  Skin: Skin is warm and dry. No rash noted.  Psychiatric: She has a normal mood and affect.    ED Course  Procedures (including critical care time)  DIAGNOSTIC STUDIES: Oxygen Saturation is 96% on room air, adequate by my interpretation.     COORDINATION OF CARE: 6:08 PM- Discussed a clinical suspicion of bronchitis and discharging the patient with cough medication, steroids, and an inhaler.  The patient agreed to the treatment plan.   Labs Review Labs Reviewed - No data to display  Imaging Review Dg Chest 2 View  02/24/2014   CLINICAL DATA:  Cough.  EXAM: CHEST  2 VIEW  COMPARISON:  Chest x-ray 02/06/2009.  FINDINGS: Lung volumes are normal. No consolidative airspace disease. No pleural effusions. No pneumothorax. No pulmonary nodule or mass noted. Pulmonary vasculature and the cardiomediastinal silhouette are within normal limits.  IMPRESSION: No radiographic evidence of acute cardiopulmonary disease.   Electronically Signed   By: Trudie Reed M.D.   On: 02/24/2014 17:59     EKG Interpretation None      MDM   Final diagnoses:  Bronchitis    Patient is well-appearing  with symptoms suggestive of bronchitis. There is no evidence of pneumonia. She has no hypoxia. She likely has a viral bronchitis. She was discharged home with a prescription for prednisone and albuterol inhaler. She was also given prescription for Tussionex cough syrup. She does say that she's sexually active and not using birth control. Her last menstrual period was July 5. I did advise her that it was too early at this point to check a pregnancy test. I advised her she misses her next period to check a pregnancy test. I also advised her the risk of taking medications in early pregnancy and that the medications prescribed are Class C, and the she would have to weigh the risks/benefits in a possible early pregnancy.  I explained these to her.  I personally performed the services described in this documentation, which was scribed in my presence.  The recorded information has been reviewed and considered.    Rolan Bucco, MD 02/24/14 [phone]

## 2014-02-24 NOTE — Discharge Instructions (Signed)
Upper Respiratory Infection, Adult An upper respiratory infection (URI) is also sometimes known as the common cold. The upper respiratory tract includes the nose, sinuses, throat, trachea, and bronchi. Bronchi are the airways leading to the lungs. Most people improve within 1 week, but symptoms can last up to 2 weeks. A residual cough may last even longer.  CAUSES Many different viruses can infect the tissues lining the upper respiratory tract. The tissues become irritated and inflamed and often become very moist. Mucus production is also common. A cold is contagious. You can easily spread the virus to others by oral contact. This includes kissing, sharing a glass, coughing, or sneezing. Touching your mouth or nose and then touching a surface, which is then touched by another person, can also spread the virus. SYMPTOMS  Symptoms typically develop 1 to 3 days after you come in contact with a cold virus. Symptoms vary from person to person. They may include:  Runny nose.  Sneezing.  Nasal congestion.  Sinus irritation.  Sore throat.  Loss of voice (laryngitis).  Cough.  Fatigue.  Muscle aches.  Loss of appetite.  Headache.  Low-grade fever. DIAGNOSIS  You might diagnose your own cold based on familiar symptoms, since most people get a cold 2 to 3 times a year. Your caregiver can confirm this based on your exam. Most importantly, your caregiver can check that your symptoms are not due to another disease such as strep throat, sinusitis, pneumonia, asthma, or epiglottitis. Blood tests, throat tests, and X-rays are not necessary to diagnose a common cold, but they may sometimes be helpful in excluding other more serious diseases. Your caregiver will decide if any further tests are required. RISKS AND COMPLICATIONS  You may be at risk for a more severe case of the common cold if you smoke cigarettes, have chronic heart disease (such as heart failure) or lung disease (such as asthma), or if  you have a weakened immune system. The very young and very old are also at risk for more serious infections. Bacterial sinusitis, middle ear infections, and bacterial pneumonia can complicate the common cold. The common cold can worsen asthma and chronic obstructive pulmonary disease (COPD). Sometimes, these complications can require emergency medical care and may be life-threatening. PREVENTION  The best way to protect against getting a cold is to practice good hygiene. Avoid oral or hand contact with people with cold symptoms. Wash your hands often if contact occurs. There is no clear evidence that vitamin C, vitamin E, echinacea, or exercise reduces the chance of developing a cold. However, it is always recommended to get plenty of rest and practice good nutrition. TREATMENT  Treatment is directed at relieving symptoms. There is no cure. Antibiotics are not effective, because the infection is caused by a virus, not by bacteria. Treatment may include:  Increased fluid intake. Sports drinks offer valuable electrolytes, sugars, and fluids.  Breathing heated mist or steam (vaporizer or shower).  Eating chicken soup or other clear broths, and maintaining good nutrition.  Getting plenty of rest.  Using gargles or lozenges for comfort.  Controlling fevers with ibuprofen or acetaminophen as directed by your caregiver.  Increasing usage of your inhaler if you have asthma. Zinc gel and zinc lozenges, taken in the first 24 hours of the common cold, can shorten the duration and lessen the severity of symptoms. Pain medicines may help with fever, muscle aches, and throat pain. A variety of non-prescription medicines are available to treat congestion and runny nose. Your caregiver   can make recommendations and may suggest nasal or lung inhalers for other symptoms.  HOME CARE INSTRUCTIONS   Only take over-the-counter or prescription medicines for pain, discomfort, or fever as directed by your  caregiver.  Use a warm mist humidifier or inhale steam from a shower to increase air moisture. This may keep secretions moist and make it easier to breathe.  Drink enough water and fluids to keep your urine clear or pale yellow.  Rest as needed.  Return to work when your temperature has returned to normal or as your caregiver advises. You may need to stay home longer to avoid infecting others. You can also use a face mask and careful hand washing to prevent spread of the virus. SEEK MEDICAL CARE IF:   After the first few days, you feel you are getting worse rather than better.  You need your caregiver's advice about medicines to control symptoms.  You develop chills, worsening shortness of breath, or brown or red sputum. These may be signs of pneumonia.  You develop yellow or brown nasal discharge or pain in the face, especially when you bend forward. These may be signs of sinusitis.  You develop a fever, swollen neck glands, pain with swallowing, or white areas in the back of your throat. These may be signs of strep throat. SEEK IMMEDIATE MEDICAL CARE IF:   You have a fever.  You develop severe or persistent headache, ear pain, sinus pain, or chest pain.  You develop wheezing, a prolonged cough, cough up blood, or have a change in your usual mucus (if you have chronic lung disease).  You develop sore muscles or a stiff neck. Document Released: 01/14/2001 Document Revised: 10/13/2011 Document Reviewed: 10/26/2013 Exit[employer] Patient Information 2015 [employer]. This information is not intended to replace advice given to you by your health care provider. Make sure you discuss any questions you have with your health care provider.  

## 2014-03-23 ENCOUNTER — Encounter (HOSPITAL_BASED_OUTPATIENT_CLINIC_OR_DEPARTMENT_OTHER): Payer: Self-pay | Admitting: Emergency Medicine

## 2014-03-23 ENCOUNTER — Emergency Department (HOSPITAL_BASED_OUTPATIENT_CLINIC_OR_DEPARTMENT_OTHER)
Admission: EM | Admit: 2014-03-23 | Discharge: 2014-03-23 | Disposition: A | Discharge status: Home / Self Care | Payer: Medicaid Other | Attending: Emergency Medicine | Admitting: Emergency Medicine

## 2014-03-23 ENCOUNTER — Emergency Department (HOSPITAL_BASED_OUTPATIENT_CLINIC_OR_DEPARTMENT_OTHER): Payer: Medicaid Other

## 2014-03-23 DIAGNOSIS — R0602 Shortness of breath: Secondary | ICD-10-CM | POA: Diagnosis present

## 2014-03-23 DIAGNOSIS — R05 Cough: Secondary | ICD-10-CM | POA: Diagnosis not present

## 2014-03-23 DIAGNOSIS — IMO0002 Reserved for concepts with insufficient information to code with codable children: Secondary | ICD-10-CM | POA: Diagnosis not present

## 2014-03-23 DIAGNOSIS — F17200 Nicotine dependence, unspecified, uncomplicated: Secondary | ICD-10-CM | POA: Diagnosis not present

## 2014-03-23 DIAGNOSIS — Z791 Long term (current) use of non-steroidal anti-inflammatories (NSAID): Secondary | ICD-10-CM | POA: Diagnosis not present

## 2014-03-23 DIAGNOSIS — Z8669 Personal history of other diseases of the nervous system and sense organs: Secondary | ICD-10-CM | POA: Diagnosis not present

## 2014-03-23 DIAGNOSIS — Z880 Allergy status to penicillin: Secondary | ICD-10-CM | POA: Insufficient documentation

## 2014-03-23 DIAGNOSIS — R071 Chest pain on breathing: Secondary | ICD-10-CM | POA: Diagnosis not present

## 2014-03-23 DIAGNOSIS — R059 Cough, unspecified: Secondary | ICD-10-CM | POA: Diagnosis not present

## 2014-03-23 DIAGNOSIS — Z79899 Other long term (current) drug therapy: Secondary | ICD-10-CM | POA: Insufficient documentation

## 2014-03-23 DIAGNOSIS — R0789 Other chest pain: Secondary | ICD-10-CM

## 2014-03-23 MED ORDER — ALBUTEROL SULFATE (2.5 MG/3ML) 0.083% IN NEBU
5.0000 mg | INHALATION_SOLUTION | Freq: Once | RESPIRATORY_TRACT | Status: AC
  Administered 2014-03-23: 5 mg via RESPIRATORY_TRACT
  Filled 2014-03-23: qty 6

## 2014-03-23 MED ORDER — ACETAMINOPHEN 325 MG PO TABS
650.0000 mg | ORAL_TABLET | Freq: Once | ORAL | Status: AC
  Administered 2014-03-23: 650 mg via ORAL
  Filled 2014-03-23: qty 2

## 2014-03-23 NOTE — ED Provider Notes (Signed)
CSN: [id number]     Arrival date & time 03/23/14  0631 History   First MD Initiated Contact with Patient 03/23/14 [phone]     Chief Complaint  Patient presents with  . Shortness of Breath     (Consider location/radiation/quality/duration/timing/severity/associated sxs/prior Treatment) HPI 28 year old female who is [redacted] weeks pregnant and presents today complaining of left posterior back pain with coughing and movement. She states that she has had bronchitis for several weeks. Review of records revealed she was seen here several weeks ago for bronchitis. She has not been running a fever or had productive cough, however she has continued to have coughing. She is a smoker and has decreased her smoking but does continue to smoke. She is not dyspneic, she has no history of swelling in her legs, no history of PE, no fever, no history of spontaneous pneumothorax. 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  Past Medical History  Diagnosis Date  . Optic neuritis   . [id number](956.2Headache(784.0)    Past Surgical History  Procedure Laterality Date  . Ceacerian    . Cesarean section     Family History  Problem Relation Age of Onset  . Other Neg Hx    History  Substance Use Topics  . Smoking status: Current Every Day Smoker  . Smokeless tobacco: Never Used  . Alcohol Use: No   OB History   Grav Para Term Preterm Abortions TAB SAB Ect Mult Living   4 1 1  2 2    1      Review of Systems  All other systems reviewed and are negative.     Allergies  Amoxicillin; Penicillins; Augmentin; and Tramadol  Home Medications   Prior to Admission medications   Medication Sig Start Date End Date Taking? Authorizing Provider  albuterol (PROVENTIL HFA;VENTOLIN HFA) 108 (90 BASE) MCG/ACT inhaler Inhale 2 puffs into the lungs every 4 (four) hours as needed for wheezing. 05/05/13  Yes Enid SkeensJoshua M Zavitz, MD  albuterol (PROVENTIL HFA;VENTOLIN HFA) 108 (90 BASE) MCG/ACT  inhaler Inhale 1-2 puffs into the lungs every 6 (six) hours as needed for wheezing or shortness of breath. 02/24/14  Yes Rolan BuccoMelanie Belfi, MD  acetaZOLAMIDE (DIAMOX) 500 MG capsule Take 500 mg by mouth 2 (two) times daily.      Historical Provider, MD  albuterol (PROVENTIL HFA;VENTOLIN HFA) 108 (90 BASE) MCG/ACT inhaler Inhale 2 puffs into the lungs every 6 (six) hours as needed. For shortness of breath    Historical Provider, MD  chlorpheniramine-HYDROcodone (TUSSIONEX PENNKINETIC ER) 10-8 MG/5ML LQCR Take 5 mLs by mouth at bedtime as needed for cough. 02/24/14   Rolan BuccoMelanie Belfi, MD  cyclobenzaprine (FLEXERIL) 10 MG tablet Take 1 tablet (10 mg total) by mouth 2 (two) times daily as needed for muscle spasms. 05/05/13   Enid SkeensJoshua M Zavitz, MD  FLUoxetine (PROZAC) 10 MG capsule Take 10 mg by mouth daily.      Historical Provider, MD  HYDROcodone-acetaminophen (NORCO/VICODIN) 5-325 MG per tablet Take 2 tablets by mouth every 4 (four) hours as needed. 04/28/13   Elson AreasLeslie K Sofia, PA-C  HYDROcodone-acetaminophen (NORCO/VICODIN) 5-325 MG per tablet Take 1-2 tablets by mouth every 6 (six) hours as needed for moderate pain. 11/27/13   Carlisle BeersJohn L Molpus, MD  naproxen (NAPROSYN) 500 MG tablet Take 1 tablet (500 mg total) by mouth 2 (two) times daily. 05/05/13   Enid SkeensJoshua M Zavitz, MD  predniSONE (DELTASONE) 10 MG tablet 6,5,4,3,2,1 taper  04/28/13   Elson Areas, PA-C  predniSONE (DELTASONE) 20 MG tablet 3 tabs po day one, then 2 po daily x 4 days 02/24/14   Rolan Bucco, MD  traZODone (DESYREL) 100 MG tablet Take 100 mg by mouth at bedtime.      Historical Provider, MD   BP 126/89  Pulse 82  Temp(Src) 98 F (36.7 C) (Oral)  Resp 20  Ht 5\' 6"  (1.676 m)  Wt 257 lb (116.574 kg)  BMI 41.50 kg/m2  SpO2 100%  LMP 02/05/2014 Physical Exam  Nursing note and vitals reviewed. Constitutional: She is oriented to person, place, and time. She appears well-developed and well-nourished.  HENT:  Head: Normocephalic and atraumatic.    Right Ear: External ear normal.  Left Ear: External ear normal.  Nose: Nose normal.  Mouth/Throat: Oropharynx is clear and moist.  Eyes: Conjunctivae and EOM are normal. Pupils are equal, round, and reactive to light.  Neck: Normal range of motion. Neck supple.  Cardiovascular: Normal rate, regular rhythm, normal heart sounds and intact distal pulses.   Pulmonary/Chest: Effort normal.    Few scattered rhonchi left base  Abdominal: Soft. Bowel sounds are normal.  Musculoskeletal: Normal range of motion.  Tenderness palpation below the left scapula  Neurological: She is alert and oriented to person, place, and time. She has normal reflexes.  Skin: Skin is warm and dry.  Psychiatric: She has a normal mood and affect. Her behavior is normal. Judgment and thought content normal.    ED Course  Procedures (including critical care time) Labs Review Labs Reviewed - No data to display  Imaging Review Dg Chest 2 View  03/23/2014   CLINICAL DATA:  Shortness of breath, asthma  EXAM: CHEST  2 VIEW  COMPARISON:  02/24/2014  FINDINGS: The heart size and mediastinal contours are within normal limits. Both lungs are clear. The visualized skeletal structures are unremarkable.  IMPRESSION: No active cardiopulmonary disease.   Electronically Signed   By: Ruel Favors M.D.   On: 03/23/2014 07:42     EKG Interpretation None      MDM   Final diagnoses:  Cough  Left-sided chest wall pain        Hilario Quarry, MD 03/23/14 1544

## 2014-03-23 NOTE — ED Notes (Signed)
Pt seen here last month for bronchitis and states that sxs have not improved. Cough with lower back pain, pt states she is [redacted] weeks pregnant

## 2014-03-23 NOTE — Discharge Instructions (Signed)

## 2014-04-12 LAB — OB RESULTS CONSOLE ANTIBODY SCREEN: ANTIBODY SCREEN: NEGATIVE

## 2014-04-12 LAB — OB RESULTS CONSOLE ABO/RH: RH TYPE: POSITIVE

## 2014-04-12 LAB — OB RESULTS CONSOLE HEPATITIS B SURFACE ANTIGEN: Hepatitis B Surface Ag: NEGATIVE

## 2014-04-12 LAB — OB RESULTS CONSOLE RPR: RPR: NONREACTIVE

## 2014-04-12 LAB — OB RESULTS CONSOLE RUBELLA ANTIBODY, IGM: RUBELLA: IMMUNE

## 2014-04-12 LAB — OB RESULTS CONSOLE HIV ANTIBODY (ROUTINE TESTING): HIV: NONREACTIVE

## 2014-06-05 ENCOUNTER — Encounter (HOSPITAL_BASED_OUTPATIENT_CLINIC_OR_DEPARTMENT_OTHER): Payer: Self-pay | Admitting: Emergency Medicine

## 2014-06-23 ENCOUNTER — Other Ambulatory Visit (HOSPITAL_COMMUNITY): Payer: Self-pay | Admitting: Obstetrics and Gynecology

## 2014-06-23 DIAGNOSIS — R9389 Abnormal findings on diagnostic imaging of other specified body structures: Secondary | ICD-10-CM

## 2014-06-30 ENCOUNTER — Ambulatory Visit (HOSPITAL_COMMUNITY)
Admission: RE | Admit: 2014-06-30 | Discharge: 2014-06-30 | Disposition: A | Discharge status: Home / Self Care | Payer: Medicaid Other | Source: Ambulatory Visit | Attending: Obstetrics and Gynecology | Admitting: Obstetrics and Gynecology

## 2014-06-30 DIAGNOSIS — Z3689 Encounter for other specified antenatal screening: Secondary | ICD-10-CM | POA: Insufficient documentation

## 2014-06-30 DIAGNOSIS — O99210 Obesity complicating pregnancy, unspecified trimester: Secondary | ICD-10-CM | POA: Insufficient documentation

## 2014-06-30 DIAGNOSIS — Z36 Encounter for antenatal screening of mother: Secondary | ICD-10-CM | POA: Insufficient documentation

## 2014-06-30 DIAGNOSIS — O3421 Maternal care for scar from previous cesarean delivery: Secondary | ICD-10-CM | POA: Diagnosis present

## 2014-06-30 DIAGNOSIS — Z3A20 20 weeks gestation of pregnancy: Secondary | ICD-10-CM | POA: Insufficient documentation

## 2014-06-30 DIAGNOSIS — R9389 Abnormal findings on diagnostic imaging of other specified body structures: Secondary | ICD-10-CM

## 2014-06-30 DIAGNOSIS — O99212 Obesity complicating pregnancy, second trimester: Secondary | ICD-10-CM | POA: Insufficient documentation

## 2014-06-30 DIAGNOSIS — O34219 Maternal care for unspecified type scar from previous cesarean delivery: Secondary | ICD-10-CM | POA: Insufficient documentation

## 2014-06-30 DIAGNOSIS — Z720 Tobacco use: Secondary | ICD-10-CM | POA: Diagnosis not present

## 2014-07-04 ENCOUNTER — Other Ambulatory Visit (HOSPITAL_COMMUNITY): Payer: Self-pay | Admitting: Obstetrics and Gynecology

## 2014-07-04 DIAGNOSIS — O283 Abnormal ultrasonic finding on antenatal screening of mother: Secondary | ICD-10-CM

## 2014-07-04 DIAGNOSIS — Z3A24 24 weeks gestation of pregnancy: Secondary | ICD-10-CM

## 2014-07-27 ENCOUNTER — Other Ambulatory Visit (HOSPITAL_COMMUNITY): Payer: Self-pay

## 2014-07-27 ENCOUNTER — Encounter (HOSPITAL_COMMUNITY): Payer: Self-pay

## 2014-07-27 ENCOUNTER — Ambulatory Visit (HOSPITAL_COMMUNITY)
Admission: RE | Admit: 2014-07-27 | Discharge: 2014-07-27 | Disposition: A | Discharge status: Home / Self Care | Payer: Medicaid Other | Source: Ambulatory Visit | Attending: Obstetrics and Gynecology | Admitting: Obstetrics and Gynecology

## 2014-07-27 DIAGNOSIS — O3421 Maternal care for scar from previous cesarean delivery: Secondary | ICD-10-CM | POA: Insufficient documentation

## 2014-07-27 DIAGNOSIS — Z36 Encounter for antenatal screening of mother: Secondary | ICD-10-CM | POA: Insufficient documentation

## 2014-07-27 DIAGNOSIS — O99332 Smoking (tobacco) complicating pregnancy, second trimester: Secondary | ICD-10-CM | POA: Insufficient documentation

## 2014-07-27 DIAGNOSIS — Z3A24 24 weeks gestation of pregnancy: Secondary | ICD-10-CM | POA: Diagnosis present

## 2014-07-27 DIAGNOSIS — IMO0002 Reserved for concepts with insufficient information to code with codable children: Secondary | ICD-10-CM | POA: Insufficient documentation

## 2014-07-27 DIAGNOSIS — F17210 Nicotine dependence, cigarettes, uncomplicated: Secondary | ICD-10-CM | POA: Insufficient documentation

## 2014-07-27 DIAGNOSIS — Z0489 Encounter for examination and observation for other specified reasons: Secondary | ICD-10-CM | POA: Insufficient documentation

## 2014-07-27 DIAGNOSIS — O99210 Obesity complicating pregnancy, unspecified trimester: Secondary | ICD-10-CM | POA: Insufficient documentation

## 2014-07-27 DIAGNOSIS — O283 Abnormal ultrasonic finding on antenatal screening of mother: Secondary | ICD-10-CM

## 2014-07-27 HISTORY — DX: Major depressive disorder, single episode, unspecified: F32.9

## 2014-07-27 HISTORY — DX: Depression, unspecified: F32.A

## 2014-07-30 DIAGNOSIS — O99212 Obesity complicating pregnancy, second trimester: Secondary | ICD-10-CM

## 2014-07-31 ENCOUNTER — Other Ambulatory Visit (HOSPITAL_COMMUNITY): Payer: Self-pay | Admitting: Obstetrics and Gynecology

## 2014-07-31 DIAGNOSIS — O2602 Excessive weight gain in pregnancy, second trimester: Secondary | ICD-10-CM

## 2014-08-04 NOTE — L&D Delivery Note (Signed)
Asked to attend C-section delivery of this 38 weeks and 6 days gestation female infant born to a 29 year old white female, gravida 4 para 1-0-2-1 who was admitted for repeat C-section after having spontaneous rupture of membranes[facility]  The mother is O positive. RPR negative. Hepatitis B surface antigen negative, HIV negative, GC and Chlamydia negative. She had been scheduled for a C-section on April 4. Maternal history positive for bipolar disorder, obesity, smoking, and back pain. There was also a history of marijuana and cocaine, apparently not with this pregnancy. She was taking percocet for the back pain. The infant cried spontaneously. She was given routine newborn measures, apgars were 9 and 9 at one and five minutes, with a normal PE. She was left with CN personnel and the parents in stable condition.  Shelina Luo A. Effie Shyoleman, NNP-BC

## 2014-08-11 ENCOUNTER — Encounter (HOSPITAL_COMMUNITY): Payer: Self-pay | Admitting: General Practice

## 2014-08-11 ENCOUNTER — Inpatient Hospital Stay (HOSPITAL_COMMUNITY)
Admission: AD | Admit: 2014-08-11 | Discharge: 2014-08-11 | Disposition: A | Discharge status: Home / Self Care | Payer: Medicaid Other | Source: Ambulatory Visit | Attending: Obstetrics and Gynecology | Admitting: Obstetrics and Gynecology

## 2014-08-11 DIAGNOSIS — O99332 Smoking (tobacco) complicating pregnancy, second trimester: Secondary | ICD-10-CM | POA: Insufficient documentation

## 2014-08-11 DIAGNOSIS — Z6838 Body mass index (BMI) 38.0-38.9, adult: Secondary | ICD-10-CM | POA: Insufficient documentation

## 2014-08-11 DIAGNOSIS — O99212 Obesity complicating pregnancy, second trimester: Secondary | ICD-10-CM | POA: Insufficient documentation

## 2014-08-11 DIAGNOSIS — O26893 Other specified pregnancy related conditions, third trimester: Secondary | ICD-10-CM

## 2014-08-11 DIAGNOSIS — M545 Low back pain: Secondary | ICD-10-CM | POA: Diagnosis present

## 2014-08-11 DIAGNOSIS — M549 Dorsalgia, unspecified: Secondary | ICD-10-CM

## 2014-08-11 DIAGNOSIS — O9989 Other specified diseases and conditions complicating pregnancy, childbirth and the puerperium: Secondary | ICD-10-CM | POA: Diagnosis not present

## 2014-08-11 DIAGNOSIS — Z3A26 26 weeks gestation of pregnancy: Secondary | ICD-10-CM | POA: Insufficient documentation

## 2014-08-11 DIAGNOSIS — N949 Unspecified condition associated with female genital organs and menstrual cycle: Secondary | ICD-10-CM

## 2014-08-11 LAB — URINALYSIS, ROUTINE W REFLEX MICROSCOPIC
BILIRUBIN URINE: NEGATIVE
Glucose, UA: NEGATIVE mg/dL
Hgb urine dipstick: NEGATIVE
Ketones, ur: 40 mg/dL — AB
Leukocytes, UA: NEGATIVE
Nitrite: NEGATIVE
Protein, ur: NEGATIVE mg/dL
SPECIFIC GRAVITY, URINE: 1.02 (ref 1.005–1.030)
Urobilinogen, UA: 0.2 mg/dL (ref 0.0–1.0)
pH: 6.5 (ref 5.0–8.0)

## 2014-08-11 LAB — WET PREP, GENITAL
CLUE CELLS WET PREP: NONE SEEN
TRICH WET PREP: NONE SEEN
WBC WET PREP: NONE SEEN
YEAST WET PREP: NONE SEEN

## 2014-08-11 MED ORDER — OXYCODONE-ACETAMINOPHEN 5-325 MG PO TABS: 1.0000 | ORAL_TABLET | Freq: Four times a day (QID) | ORAL | Status: DC | PRN

## 2014-08-11 MED ORDER — OXYCODONE-ACETAMINOPHEN 5-325 MG PO TABS: 1.0000 | ORAL_TABLET | ORAL | Status: DC | PRN

## 2014-08-11 NOTE — Progress Notes (Signed)
Pt prefers to lie flatter due to back pain.

## 2014-08-11 NOTE — MAU Note (Signed)
Back pain for past wk. Some pressure at C/S scar. Shooting vag pain on occ. Have a migraine. Leaked fld tonight when got out of shower.

## 2014-08-11 NOTE — Progress Notes (Signed)
Pt did not want pain med here since she would have to wait afterward. Written and verbal d/c instructions given and understanding voiced

## 2014-08-11 NOTE — Discharge Instructions (Signed)
Back Pain in Pregnancy °Back pain during pregnancy is common. It happens in about half of all pregnancies. It is important for you and your baby that you remain active during your pregnancy. If you feel that back pain is not allowing you to remain active or sleep well, it is time to see your caregiver. Back pain may be caused by several factors related to changes during your pregnancy. Fortunately, unless you had trouble with your back before your pregnancy, the pain is likely to get better after you deliver. °Low back pain usually occurs between the fifth and seventh months of pregnancy. It can, however, happen in the first couple months. Factors that increase the risk of back problems include:  °· Previous back problems. °· Injury to your back. °· Having twins or multiple births. °· A chronic cough. °· Stress. °· Job-related repetitive motions. °· Muscle or spinal disease in the back. °· Family history of back problems, ruptured (herniated) discs, or osteoporosis. °· Depression, anxiety, and panic attacks. °CAUSES  °· When you are pregnant, your body produces a hormone called relaxin. This hormone makes the ligaments connecting the low back and pubic bones more flexible. This flexibility allows the baby to be delivered more easily. When your ligaments are loose, your muscles need to work harder to support your back. Soreness in your back can come from tired muscles. Soreness can also come from back tissues that are irritated since they are receiving less support. °· As the baby grows, it puts pressure on the nerves and blood vessels in your pelvis. This can cause back pain. °· As the baby grows and gets heavier during pregnancy, the uterus pushes the stomach muscles forward and changes your center of gravity. This makes your back muscles work harder to maintain good posture. °SYMPTOMS  °Lumbar pain during pregnancy °Lumbar pain during pregnancy usually occurs at or above the waist in the center of the back. There  may be pain and numbness that radiates into your leg or foot. This is similar to low back pain experienced by non-pregnant women. It usually increases with sitting for long periods of time, standing, or repetitive lifting. Tenderness may also be present in the muscles along your upper back. °Posterior pelvic pain during pregnancy °Pain in the back of the pelvis is more common than lumbar pain in pregnancy. It is a deep pain felt in your side at the waistline, or across the tailbone (sacrum), or in both places. You may have pain on one or both sides. This pain can also go into the buttocks and backs of the upper thighs. Pubic and groin pain may also be present. The pain does not quickly resolve with rest, and morning stiffness may also be present. °Pelvic pain during pregnancy can be brought on by most activities. A high level of fitness before and during pregnancy may or may not prevent this problem. Labor pain is usually 1 to 2 minutes apart, lasts for about 1 minute, and involves a bearing down feeling or pressure in your pelvis. However, if you are at term with the pregnancy, constant low back pain can be the beginning of early labor, and you should be aware of this. °DIAGNOSIS  °X-rays of the back should not be done during the first 12 to 14 weeks of the pregnancy and only when absolutely necessary during the rest of the pregnancy. MRIs do not give off radiation and are safe during pregnancy. MRIs also should only be done when absolutely necessary. °HOME CARE INSTRUCTIONS °· Exercise   as directed by your caregiver. Exercise is the most effective way to prevent or manage back pain. If you have a back problem, it is especially important to avoid sports that require sudden body movements. Swimming and walking are great activities. °· Do not stand in one place for long periods of time. °· Do not wear high heels. °· Sit in chairs with good posture. Use a pillow on your lower back if necessary. Make sure your head  rests over your shoulders and is not hanging forward. °· Try sleeping on your side, preferably the left side, with a pillow or two between your legs. If you are sore after a night's rest, your bed may be too soft. Try placing a board between your mattress and box spring. °· Listen to your body when lifting. If you are experiencing pain, ask for help or try bending your knees more so you can use your leg muscles rather than your back muscles. Squat down when picking up something from the floor. Do not bend over. °· Eat a healthy diet. Try to gain weight within your caregiver's recommendations. °· Use heat or cold packs 3 to 4 times a day for 15 minutes to help with the pain. °· Only take over-the-counter or prescription medicines for pain, discomfort, or fever as directed by your caregiver. °Sudden (acute) back pain °· Use bed rest for only the most extreme, acute episodes of back pain. Prolonged bed rest over 48 hours will aggravate your condition. °· Ice is very effective for acute conditions. °¨ Put ice in a plastic bag. °¨ Place a towel between your skin and the bag. °¨ Leave the ice on for 10 to 20 minutes every 2 hours, or as needed. °· Using heat packs for 30 minutes prior to activities is also helpful. °Continued back pain °See your caregiver if you have continued problems. Your caregiver can help or refer you for appropriate physical therapy. With conditioning, most back problems can be avoided. Sometimes, a more serious issue may be the cause of back pain. You should be seen right away if new problems seem to be developing. Your caregiver may recommend: °· A maternity girdle. °· An elastic sling. °· A back brace. °· A massage therapist or acupuncture. °SEEK MEDICAL CARE IF:  °· You are not able to do most of your daily activities, even when taking the pain medicine you were given. °· You need a referral to a physical therapist or chiropractor. °· You want to try acupuncture. °SEEK IMMEDIATE MEDICAL CARE  IF: °· You develop numbness, tingling, weakness, or problems with the use of your arms or legs. °· You develop severe back pain that is no longer relieved with medicines. °· You have a sudden change in bowel or bladder control. °· You have increasing pain in other areas of the body. °· You develop shortness of breath, dizziness, or fainting. °· You develop nausea, vomiting, or sweating. °· You have back pain which is similar to labor pains. °· You have back pain along with your water breaking or vaginal bleeding. °· You have back pain or numbness that travels down your leg. °· Your back pain developed after you fell. °· You develop pain on one side of your back. You may have a kidney stone. °· You see blood in your urine. You may have a bladder infection or kidney stone. °· You have back pain with blisters. You may have shingles. °Back pain is fairly common during pregnancy but should not be accepted as just part of   the process. Back pain should always be treated as soon as possible. This will make your pregnancy as pleasant as possible. Document Released: 10/29/2005 Document Revised: 10/13/2011 Document Reviewed: 12/10/2010 [facility]ExitCare Patient Information 2015 [place], [place]. This information is not intended to replace advice given to you by your health care provider. Make sure you discuss any questions you have with your health care provider. Abdominal Pain During Pregnancy Abdominal pain is common in pregnancy. Most of the time, it does not cause harm. There are many causes of abdominal pain. Some causes are more serious than others. Some of the causes of abdominal pain in pregnancy are easily diagnosed. Occasionally, the diagnosis takes time to understand. Other times, the cause is not determined. Abdominal pain can be a sign that something is very wrong with the pregnancy, or the pain may have nothing to do with the pregnancy at all. For this reason, always tell your health care provider if you have any  abdominal discomfort. HOME CARE INSTRUCTIONS  Monitor your abdominal pain for any changes. The following actions may help to alleviate any discomfort you are experiencing:  Do not have sexual intercourse or put anything in your vagina until your symptoms go away completely.  Get plenty of rest until your pain improves.  Drink clear fluids if you feel nauseous. Avoid solid food as long as you are uncomfortable or nauseous.  Only take over-the-counter or prescription medicine as directed by your health care provider.  Keep all follow-up appointments with your health care provider. SEEK IMMEDIATE MEDICAL CARE IF:  You are bleeding, leaking fluid, or passing tissue from the vagina.  You have increasing pain or cramping.  You have persistent vomiting.  You have painful or bloody urination.  You have a fever.  You notice a decrease in your baby's movements.  You have extreme weakness or feel faint.  You have shortness of breath, with or without abdominal pain.  You develop a severe headache with abdominal pain.  You have abnormal vaginal discharge with abdominal pain.  You have persistent diarrhea.  You have abdominal pain that continues even after rest, or gets worse. MAKE SURE YOU:   Understand these instructions.  Will watch your condition.  Will get help right away if you are not doing well or get worse. Document Released: 07/21/2005 Document Revised: 05/11/2013 Document Reviewed: 02/17/2013 [facility] Patient Information 2015 [place], [place]. This information is not intended to replace advice given to you by your health care provider. Make sure you discuss any questions you have with your health care provider.

## 2014-08-11 NOTE — Progress Notes (Signed)
Water to pt

## 2014-08-11 NOTE — Progress Notes (Signed)
FHR difficult to monitor due to FM, baby with hiccoughs,and maternal habitus

## 2014-08-11 NOTE — MAU Provider Note (Signed)
Chief Complaint:  Back Pain; Abdominal Pain; Vaginal Pain; and Rupture of Membranes   First Provider Initiated Contact with Patient 08/11/14 2105      HPI: Cynthia Odonnell is a 29 y.o. G4P1021 at [redacted]w[redacted]d who presents with 1 wk hx intermittent dull low back pain  That is constant but waxes and wanes. Exacerbated by moving and walking. Also having vaginal pain and sharp shooting lower left buttock pain radiating down the left posterior thigh, also worse with movement. Also has a constant frontal headache. Has tried Tylenol 650 mg with little or no relief. Pain and pressure In addition she noted small amount vaginal leaking when she came out of the shower 3 hours ago. Not wearing peri-pad and has not had leakage since. Denies vaginal pruritus. Has urinary frequency but denies urgency, hematuria, dysuria. Denies contractions or vaginal bleeding. Good fetal movement.   Pregnancy Course: Morbid obesity; smoker; chronic pain at site of C-section scar  Past Medical History: Past Medical History  Diagnosis Date  . Optic neuritis   . Headache(784.0)   . Depression     Past obstetric history: OB History  Gravida Para Term Preterm AB SAB TAB Ectopic Multiple Living  # Outcome Date GA Lbr Len/2nd Weight Sex Delivery Anes PTL Lv  4 Current           3 TAB           2 TAB           1 Term               Past Surgical History: Past Surgical History  Procedure Laterality Date  . Ceacerian    . Cesarean section       Family History: Family History  Problem Relation Age of Onset  . Other Neg Hx     Social History: History  Substance Use Topics  . Smoking status: Current Every Day Smoker -- 1.00 packs/day  . Smokeless tobacco: Never Used  . Alcohol Use: No    Allergies:  Allergies  Allergen Reactions  . Amoxicillin Hives  . Codeine Itching  . Penicillins Hives  . Augmentin [Amoxicillin-Pot Clavulanate] Hives and Rash  . Tramadol Itching and Rash    Meds:   Prescriptions prior to admission  Medication Sig Dispense Refill Last Dose  . acetaZOLAMIDE (DIAMOX) 500 MG capsule Take 500 mg by mouth 2 (two) times daily.     Not Taking  . albuterol (PROVENTIL HFA;VENTOLIN HFA) 108 (90 BASE) MCG/ACT inhaler Inhale 2 puffs into the lungs every 4 (four) hours as needed for wheezing. (Patient not taking: Reported on 07/27/2014) 1 Inhaler 0 Not Taking  . albuterol (PROVENTIL HFA;VENTOLIN HFA) 108 (90 BASE) MCG/ACT inhaler Inhale 1-2 puffs into the lungs every 6 (six) hours as needed for wheezing or shortness of breath. (Patient not taking: Reported on 07/27/2014) 1 Inhaler 0 Not Taking  . chlorpheniramine-HYDROcodone (TUSSIONEX PENNKINETIC ER) 10-8 MG/5ML LQCR Take 5 mLs by mouth at bedtime as needed for cough. (Patient not taking: Reported on 07/27/2014) 115 mL 0 Not Taking  . cyclobenzaprine (FLEXERIL) 10 MG tablet Take 1 tablet (10 mg total) by mouth 2 (two) times daily as needed for muscle spasms. (Patient not taking: Reported on 07/27/2014) 10 tablet 0 Not Taking  . HYDROcodone-acetaminophen (NORCO/VICODIN) 5-325 MG per tablet Take 2 tablets by mouth every 4 (four) hours as needed. (Patient not taking: Reported on 07/27/2014) 20 tablet  0 Not Taking  . HYDROcodone-acetaminophen (NORCO/VICODIN) 5-325 MG per tablet Take 1-2 tablets by mouth every 6 (six) hours as needed for moderate pain. (Patient not taking: Reported on 07/27/2014) 20 tablet 0 Not Taking  . naproxen (NAPROSYN) 500 MG tablet Take 1 tablet (500 mg total) by mouth 2 (two) times daily. (Patient not taking: Reported on 07/27/2014) 15 tablet 0 Not Taking  . predniSONE (DELTASONE) 10 MG tablet 6,5,4,3,2,1 taper (Patient not taking: Reported on 07/27/2014) 21 tablet 0 Not Taking  . predniSONE (DELTASONE) 20 MG tablet 3 tabs po day one, then 2 po daily x 4 days (Patient not taking: Reported on 07/27/2014) 11 tablet 0 Not Taking  . Prenatal Vit-Fe Fumarate-FA (PRENATAL VITAMIN PO) Take by mouth.   Taking  .  traZODone (DESYREL) 100 MG tablet Take 100 mg by mouth at bedtime.     Not Taking    ROS: Pertinent findings in history of present illness.  Physical Exam  Blood pressure 142/72, pulse 97, temperature 98.1 F (36.7 C), resp. rate 18, height  (1.676 m), weight 108.5 kg (239 lb 3.2 oz), last menstrual period 02/05/2014, SpO2 99 %. GENERAL: Obese female in no acute distress.  HEENT: normocephalic HEART: normal rate RESP: normal effort ABDOMEN: Soft, non-tender, gravid appropriate for gestational age BACK: No CVAT. Minimal tenderness to palpation lumbar sacral paraspinous region and left lower buttock. EXTREMITIES: Nontender, no edema NEURO: alert and oriented SPECULUM EXAM: NEFG, scant white discharge, no blood, cervix clean; neg pool, neg fern  SVE: L/C/H  FHT:  Baseline 145-150 , moderate variability, accelerations present, occasional mild variable decelerations to 120 Contractions: none   Labs: Results for orders placed or performed during the hospital encounter of 08/11/14 (from the past 24 hour(s))  Urinalysis, Routine w reflex microscopic     Status: Abnormal   Collection Time: 08/11/14  8:40 PM  Result Value Ref Range   Color, Urine YELLOW YELLOW   APPearance CLEAR CLEAR   Specific Gravity, Urine 1.020 1.005 - 1.030   pH 6.5 5.0 - 8.0   Glucose, UA NEGATIVE NEGATIVE mg/dL   Hgb urine dipstick NEGATIVE NEGATIVE   Bilirubin Urine NEGATIVE NEGATIVE   Ketones, ur 40 (A) NEGATIVE mg/dL   Protein, ur NEGATIVE NEGATIVE mg/dL   Urobilinogen, UA 0.2 0.0 - 1.0 mg/dL   Nitrite NEGATIVE NEGATIVE   Leukocytes, UA NEGATIVE NEGATIVE  Wet prep, genital     Status: None   Collection Time: 08/11/14  9:15 PM  Result Value Ref Range   Yeast Wet Prep HPF POC NONE SEEN NONE SEEN   Trich, Wet Prep NONE SEEN NONE SEEN   Clue Cells Wet Prep HPF POC NONE SEEN NONE SEEN   WBC, Wet Prep HPF POC NONE SEEN NONE SEEN    Imaging:  US Ob Follow Up  07/30/2014   OBSTETRICAL ULTRASOUND: This  exam was performed within a [facility]. The OB US report was generated in the [facility] system, and faxed to the ordering physician.   This report is available in the [employer]. See the [facility] Obstetric US report via the Image Link.  MAU Course: Declines Flexeril> Offered Percocet but did not want to stay Will D/C home with #8 for breakthrough pain. Continue acetaminophen C/W Dr. Ambrose Mantle  Assessment: No diagnosis found.  Plan: Discharge home Labor precautions and fetal kick counts    Medication List    STOP taking these medications        acetaminophen 325 MG tablet  Commonly known as:  TYLENOL     chlorpheniramine-HYDROcodone 10-8 MG/5ML Lqcr  Commonly known as:  TUSSIONEX PENNKINETIC ER     cyclobenzaprine 10 MG tablet  Commonly known as:  FLEXERIL     diphenhydrAMINE-zinc acetate cream  Commonly known as:  BENADRYL     HYDROcodone-acetaminophen 5-325 MG per tablet  Commonly known as:  NORCO/VICODIN     hydrOXYzine 25 MG capsule  Commonly known as:  VISTARIL     naproxen 500 MG tablet  Commonly known as:  NAPROSYN     predniSONE 10 MG tablet  Commonly known as:  DELTASONE     predniSONE 20 MG tablet  Commonly known as:  DELTASONE     PRENATAL VITAMIN PO     sertraline 100 MG tablet  Commonly known as:  ZOLOFT      TAKE these medications        albuterol 108 (90 BASE) MCG/ACT inhaler  Commonly known as:  PROVENTIL HFA;VENTOLIN HFA  Inhale 1-2 puffs into the lungs every 6 (six) hours as needed for wheezing or shortness of breath.     oxyCODONE-acetaminophen 5-325 MG per tablet  Commonly known as:  PERCOCET/ROXICET  Take 1 tablet by mouth every 4 (four) hours as needed.       Follow-up Information    Follow up with Bing PlumeHENLEY,THOMAS F, MD On 08/15/2014.   Specialty:  Obstetrics and Gynecology   Why:  Keep your scheduled prenatal appointment   Contact information:   [address], [address] [place] [id number] [phone]        Danae OrleansDeirdre C Abass Misener, CNM 08/11/2014 9:06 PM

## 2014-08-13 ENCOUNTER — Emergency Department (HOSPITAL_BASED_OUTPATIENT_CLINIC_OR_DEPARTMENT_OTHER): Payer: Medicaid Other

## 2014-08-13 ENCOUNTER — Encounter (HOSPITAL_BASED_OUTPATIENT_CLINIC_OR_DEPARTMENT_OTHER): Payer: Self-pay | Admitting: *Deleted

## 2014-08-13 ENCOUNTER — Emergency Department (HOSPITAL_BASED_OUTPATIENT_CLINIC_OR_DEPARTMENT_OTHER)
Admission: EM | Admit: 2014-08-13 | Discharge: 2014-08-13 | Disposition: A | Discharge status: Home / Self Care | Payer: Medicaid Other | Attending: Emergency Medicine | Admitting: Emergency Medicine

## 2014-08-13 DIAGNOSIS — Z3A26 26 weeks gestation of pregnancy: Secondary | ICD-10-CM | POA: Insufficient documentation

## 2014-08-13 DIAGNOSIS — O99332 Smoking (tobacco) complicating pregnancy, second trimester: Secondary | ICD-10-CM | POA: Insufficient documentation

## 2014-08-13 DIAGNOSIS — R05 Cough: Secondary | ICD-10-CM

## 2014-08-13 DIAGNOSIS — O99512 Diseases of the respiratory system complicating pregnancy, second trimester: Secondary | ICD-10-CM | POA: Diagnosis present

## 2014-08-13 DIAGNOSIS — Z8659 Personal history of other mental and behavioral disorders: Secondary | ICD-10-CM | POA: Insufficient documentation

## 2014-08-13 DIAGNOSIS — Z880 Allergy status to penicillin: Secondary | ICD-10-CM | POA: Diagnosis not present

## 2014-08-13 DIAGNOSIS — R69 Illness, unspecified: Secondary | ICD-10-CM

## 2014-08-13 DIAGNOSIS — J111 Influenza due to unidentified influenza virus with other respiratory manifestations: Secondary | ICD-10-CM

## 2014-08-13 DIAGNOSIS — Z8669 Personal history of other diseases of the nervous system and sense organs: Secondary | ICD-10-CM | POA: Diagnosis not present

## 2014-08-13 DIAGNOSIS — Z3492 Encounter for supervision of normal pregnancy, unspecified, second trimester: Secondary | ICD-10-CM

## 2014-08-13 DIAGNOSIS — F17200 Nicotine dependence, unspecified, uncomplicated: Secondary | ICD-10-CM

## 2014-08-13 DIAGNOSIS — F17210 Nicotine dependence, cigarettes, uncomplicated: Secondary | ICD-10-CM | POA: Diagnosis not present

## 2014-08-13 DIAGNOSIS — Z79899 Other long term (current) drug therapy: Secondary | ICD-10-CM | POA: Insufficient documentation

## 2014-08-13 DIAGNOSIS — R059 Cough, unspecified: Secondary | ICD-10-CM

## 2014-08-13 LAB — URINALYSIS, ROUTINE W REFLEX MICROSCOPIC
BILIRUBIN URINE: NEGATIVE
Glucose, UA: NEGATIVE mg/dL
Hgb urine dipstick: NEGATIVE
Ketones, ur: NEGATIVE mg/dL
LEUKOCYTES UA: NEGATIVE
Nitrite: NEGATIVE
Protein, ur: NEGATIVE mg/dL
Specific Gravity, Urine: 1.025 (ref 1.005–1.030)
Urobilinogen, UA: 1 mg/dL (ref 0.0–1.0)
pH: 6.5 (ref 5.0–8.0)

## 2014-08-13 MED ORDER — OSELTAMIVIR PHOSPHATE 75 MG PO CAPS: 75.0000 mg | ORAL_CAPSULE | Freq: Two times a day (BID) | ORAL | Status: DC

## 2014-08-13 MED ORDER — OSELTAMIVIR PHOSPHATE 75 MG PO CAPS
75.0000 mg | ORAL_CAPSULE | Freq: Once | ORAL | Status: AC
  Administered 2014-08-13: 75 mg via ORAL
  Filled 2014-08-13: qty 1

## 2014-08-13 NOTE — ED Notes (Addendum)
Pt having URI symptoms, [facility] and was given percocet for pain. [facility] that [facility] and here today because of the pain her pregnancy is causing, states there is pain shooting down her right leg. Is here with a gentleman she states is her husband who is having URI symptoms as well.

## 2014-08-13 NOTE — ED Provider Notes (Signed)
CSN: [id number]     Arrival date & time 08/13/14  1656 History   First MD Initiated Contact with Patient 08/13/14 1759     Chief Complaint  Patient presents with  . URI     (Consider location/radiation/quality/duration/timing/severity/associated sxs/prior Treatment) HPI  Cynthia Odonnell is a 29 y.o. female G4P2 [redacted] weeks pregnant , smokes 1.5packs per day, complaining of dry cough, rhinorrhea, myalgia, fatigue, tactile fever and chills onset 2 days ago. She denies pharyngitis, cervicalgia, rash, chest pain, shortness of breath, abdominal pain, abdominal discharge, vaginal bleeding. States baby is still active. Has had flu shot this year. Patient was seen at [facility] hospital day before last for low back and prior C-section scar pain. Patient was given Percocet for pain control, she has not had any medication today.   Past Medical History  Diagnosis Date  . Optic neuritis   . Headache(784.0)   . Depression    Past Surgical History  Procedure Laterality Date  . Ceacerian    . Cesarean section     Family History  Problem Relation Age of Onset  . Other Neg Hx    History  Substance Use Topics  . Smoking status: Current Every Day Smoker -- 1.00 packs/day  . Smokeless tobacco: Never Used  . Alcohol Use: No   OB History    Gravida Para Term Preterm AB TAB SAB Ectopic Multiple Living   Review of Systems  10 systems reviewed and found to be negative, except as noted in the HPI.   Allergies  Amoxicillin; Codeine; Penicillins; Augmentin; and Tramadol  Home Medications   Prior to Admission medications   Medication Sig Start Date End Date Taking? Authorizing Provider  albuterol (PROVENTIL HFA;VENTOLIN HFA) 108 (90 BASE) MCG/ACT inhaler Inhale 1-2 puffs into the lungs every 6 (six) hours as needed for wheezing or shortness of breath. 02/24/14   Rolan Bucco, MD  oseltamivir (TAMIFLU) 75 MG capsule Take 1 capsule (75 mg total) by mouth 2 (two) times daily.  08/13/14   Denyse Fillion, PA-C  oxyCODONE-acetaminophen (PERCOCET/ROXICET) 5-325 MG per tablet Take 1 tablet by mouth every 4 (four) hours as needed. 08/11/14   Deirdre C Poe, CNM   BP 121/88 mmHg  Pulse 89  Temp(Src) 98.4 F (36.9 C) (Oral)  Resp 16  SpO2 98%  LMP 02/05/2014 Physical Exam  Constitutional: She is oriented to person, place, and time. She appears well-developed and well-nourished. No distress.  HENT:  Head: Normocephalic.  Mouth/Throat: Oropharynx is clear and moist.  Eyes: Conjunctivae and EOM are normal. Pupils are equal, round, and reactive to light.  Cardiovascular: Normal rate, regular rhythm and intact distal pulses.   Pulmonary/Chest: Effort normal and breath sounds normal. No stridor. No respiratory distress. She has no wheezes. She has no rales. She exhibits no tenderness.  Abdominal: She exhibits no distension and no mass. There is no tenderness. There is no rebound and no guarding.  Gravid.  Musculoskeletal: Normal range of motion.  Neurological: She is alert and oriented to person, place, and time.  Psychiatric: She has a normal mood and affect.  Nursing note and vitals reviewed.   ED Course  Procedures (including critical care time) Labs Review Labs Reviewed  URINALYSIS, ROUTINE W REFLEX MICROSCOPIC - Abnormal; Notable for the following:    Color, Urine AMBER (*)    APPearance CLOUDY (*)    All other components within normal limits  INFLUENZA PANEL  BY PCR (TYPE A & B, H1N1)    Imaging Review Dg Chest 2 View  08/13/2014   CLINICAL DATA:  Cough. Congestion. Fever. Chest tightness. Shortness of breath. Duration: 2 days. Third trimester pregnancy.  EXAM: CHEST  2 VIEW  COMPARISON:  03/23/2014  FINDINGS: The heart size and mediastinal contours are within normal limits. Both lungs are clear. The visualized skeletal structures are unremarkable.  IMPRESSION: No active cardiopulmonary disease.   Electronically Signed   By: Herbie BaltimoreWalt  Liebkemann M.D.   On:  08/13/2014 19:29     EKG Interpretation None      MDM   Final diagnoses:  Cough  Influenza-like illness  Second trimester pregnancy  Tobacco use disorder    Filed Vitals:   08/13/14 1705 08/13/14 2025  BP: 128/83 121/88  Pulse: 87 89  Temp: 98.4 F (36.9 C)   TempSrc: Oral   Resp: 18 16  SpO2: 96% 98%    Medications  oseltamivir (TAMIFLU) capsule 75 mg (75 mg Oral Given 08/13/14 1930)    Cynthia Odonnell is a pleasant 29 y.o. female presenting with tactile fever, myalgia, headache, rhinorrhea, fatigue symptoms consistent with influenza. Patient has had her flu shot this year. She is [redacted] weeks pregnant. Patient has dry cough, no chest pain, shortness of breath, doubt pneumonia. Patient's husband has a bilateral pneumonia documented on x-ray, case discussed with attending physician who recommends x-ray as benefits outweigh risks. Case discussed with patient who in shared decision-making capacity consents to shielded chest x-ray.   Patient with no complaints of abdominal pain, vaginal discharge, vaginal bleeding. Fetal heart tones are normal at 1 55/m  X-ray and urinalysis without abnormality.  OB/GYN consult from Dr. Sandy SalaamUgonna Anianwu appreciated: Discuss possibility of flu and potential harm to the fetus in the second trimester. Agrees with starting patient on Tamiflu pending flu testing.  Evaluation does not show pathology that would require ongoing emergent intervention or inpatient treatment. Pt is hemodynamically stable and mentating appropriately. Discussed findings and plan with patient/guardian, who agrees with care plan. All questions answered. Return precautions discussed and outpatient follow up given.   Discharge Medication List as of 08/13/2014  8:13 PM    START taking these medications   Details  oseltamivir (TAMIFLU) 75 MG capsule Take 1 capsule (75 mg total) by mouth 2 (two) times daily., Starting 08/13/2014, Until Discontinued, Print             Wynetta Emeryicole  Jenna Ardoin, PA-C 08/13/14 2040  Vida RollerBrian D Miller, MD 08/13/14 [phone]

## 2014-08-13 NOTE — Discharge Instructions (Signed)
Follow with your OB/GYN as soon as possible.  Return to the emergency room for any worsening or concerning symptoms including fast breathing, heart racing, confusion, vomiting.  Rest, cover your mouth when you cough and wash your hands frequently.   Push fluids: water or Gatorade, do not drink any soda, juice or caffeinated beverages.

## 2014-08-14 LAB — INFLUENZA PANEL BY PCR (TYPE A & B)
H1N1 flu by pcr: NOT DETECTED
INFLAPCR: NEGATIVE
INFLBPCR: NEGATIVE

## 2014-08-22 ENCOUNTER — Emergency Department (HOSPITAL_BASED_OUTPATIENT_CLINIC_OR_DEPARTMENT_OTHER): Payer: Medicaid Other

## 2014-08-22 ENCOUNTER — Encounter (HOSPITAL_BASED_OUTPATIENT_CLINIC_OR_DEPARTMENT_OTHER): Payer: Self-pay | Admitting: Emergency Medicine

## 2014-08-22 ENCOUNTER — Emergency Department (HOSPITAL_BASED_OUTPATIENT_CLINIC_OR_DEPARTMENT_OTHER)
Admission: EM | Admit: 2014-08-22 | Discharge: 2014-08-22 | Disposition: A | Discharge status: Home / Self Care | Payer: Medicaid Other | Attending: Emergency Medicine | Admitting: Emergency Medicine

## 2014-08-22 DIAGNOSIS — Z880 Allergy status to penicillin: Secondary | ICD-10-CM | POA: Insufficient documentation

## 2014-08-22 DIAGNOSIS — Z8659 Personal history of other mental and behavioral disorders: Secondary | ICD-10-CM | POA: Diagnosis not present

## 2014-08-22 DIAGNOSIS — Z79899 Other long term (current) drug therapy: Secondary | ICD-10-CM | POA: Diagnosis not present

## 2014-08-22 DIAGNOSIS — Z8669 Personal history of other diseases of the nervous system and sense organs: Secondary | ICD-10-CM | POA: Diagnosis not present

## 2014-08-22 DIAGNOSIS — B349 Viral infection, unspecified: Secondary | ICD-10-CM | POA: Diagnosis not present

## 2014-08-22 DIAGNOSIS — R05 Cough: Secondary | ICD-10-CM

## 2014-08-22 DIAGNOSIS — M791 Myalgia: Secondary | ICD-10-CM | POA: Diagnosis not present

## 2014-08-22 DIAGNOSIS — Z720 Tobacco use: Secondary | ICD-10-CM | POA: Diagnosis not present

## 2014-08-22 DIAGNOSIS — R059 Cough, unspecified: Secondary | ICD-10-CM

## 2014-08-22 MED ORDER — PROMETHAZINE HCL 25 MG PO TABS: 25.0000 mg | ORAL_TABLET | Freq: Four times a day (QID) | ORAL | Status: DC | PRN

## 2014-08-22 MED ORDER — ONDANSETRON 4 MG PO TBDP
4.0000 mg | ORAL_TABLET | Freq: Once | ORAL | Status: AC
  Administered 2014-08-22: 4 mg via ORAL
  Filled 2014-08-22: qty 1

## 2014-08-22 NOTE — Progress Notes (Signed)
ED CM [facility]. Prescription was verified in EPIC.  No further CM needs identified

## 2014-08-22 NOTE — ED Notes (Signed)
Reports 2 weeks of flu like symptoms, fever, cough, V/D, A/O X4, ambulatory and in NAD

## 2014-08-22 NOTE — ED Provider Notes (Signed)
CSN: [id number]     Arrival date & time 08/22/14  1502 History   First MD Initiated Contact with Patient 08/22/14 1510     Chief Complaint  Patient presents with  . Influenza      HPI  Impression presents for evaluation of cough and fever body aches and nausea. Currently almost [redacted] weeks pregnant. Reportedly continues to smoke. Seen and evaluated 9 days ago. Negative chest x-ray. Treated prophylactically with Tamiflu. Ultimately flu swab was negative. She continues to have symptoms of occasional cough. States she feels "hot and cold" but states she has not checked her temperature. Episode of vomiting this morning. Has "body aches all over". Good fetal movements. No leakage of fluid. No vaginal bleeding or discharge.  Suddenly evaluated at [facility] hospital for body aches and "incision pain from her previous C-section".  Past Medical History  Diagnosis Date  . Optic neuritis   . Headache(784.0)   . Depression    Past Surgical History  Procedure Laterality Date  . Ceacerian    . Cesarean section     Family History  Problem Relation Age of Onset  . Other Neg Hx    History  Substance Use Topics  . Smoking status: Current Every Day Smoker -- 1.00 packs/day  . Smokeless tobacco: Never Used  . Alcohol Use: No   OB History    Gravida Para Term Preterm AB TAB SAB Ectopic Multiple Living   Review of Systems  Constitutional: Positive for chills. Negative for fever, diaphoresis, appetite change and fatigue.  HENT: Negative for mouth sores, sore throat and trouble swallowing.   Eyes: Negative for visual disturbance.  Respiratory: Positive for cough. Negative for chest tightness, shortness of breath and wheezing.   Cardiovascular: Negative for chest pain.  Gastrointestinal: Positive for nausea and vomiting. Negative for abdominal pain, diarrhea and abdominal distention.  Endocrine: Negative for polydipsia, polyphagia and polyuria.  Genitourinary: Negative for  dysuria, frequency and hematuria.  Musculoskeletal: Positive for myalgias. Negative for gait problem.  Skin: Negative for color change, pallor and rash.  Neurological: Negative for dizziness, syncope, light-headedness and headaches.  Hematological: Does not bruise/bleed easily.  Psychiatric/Behavioral: Negative for behavioral problems and confusion.      Allergies  Amoxicillin; Codeine; Penicillins; Augmentin; and Tramadol  Home Medications   Prior to Admission medications   Medication Sig Start Date End Date Taking? Authorizing Provider  albuterol (PROVENTIL HFA;VENTOLIN HFA) 108 (90 BASE) MCG/ACT inhaler Inhale 1-2 puffs into the lungs every 6 (six) hours as needed for wheezing or shortness of breath. 02/24/14   Rolan Bucco, MD  oseltamivir (TAMIFLU) 75 MG capsule Take 1 capsule (75 mg total) by mouth 2 (two) times daily. 08/13/14   Nicole Pisciotta, PA-C  oxyCODONE-acetaminophen (PERCOCET/ROXICET) 5-325 MG per tablet Take 1 tablet by mouth every 4 (four) hours as needed. 08/11/14   Deirdre Colin Mulders, CNM  promethazine (PHENERGAN) 25 MG tablet Take 1 tablet (25 mg total) by mouth every 6 (six) hours as needed for nausea or vomiting. 08/22/14   Rolland Porter, MD   BP 128/78 mmHg  Pulse 98  Temp(Src) 98.3 F (36.8 C) (Oral)  Resp 20  Ht  (1.676 m)  Wt 230 lb (104.327 kg)  BMI 37.14 kg/m2  SpO2 99%  LMP 02/05/2014 Physical Exam  Constitutional: She is oriented to person, place, and time. She appears well-developed and well-nourished. No distress.  HENT:  Head: Normocephalic.  Eyes: Conjunctivae are normal. Pupils are equal, round, and reactive to light. No scleral icterus.  Neck: Normal range of motion. Neck supple. No thyromegaly present.  Cardiovascular: Normal rate and regular rhythm.  Exam reveals no gallop and no friction rub.   No murmur heard. Pulmonary/Chest: Effort normal and breath sounds normal. No respiratory distress. She has no wheezes. She has no rales.  Diffuse  scattered rhonchi. No prolongation. No focal tenderness breath sounds  Abdominal: Soft. Bowel sounds are normal. She exhibits no distension. There is no tenderness. There is no rebound.  Gravid. Soft benign.  Musculoskeletal: Normal range of motion.  Neurological: She is alert and oriented to person, place, and time.  Skin: Skin is warm and dry. No rash noted.  Psychiatric: She has a normal mood and affect. Her behavior is normal.    ED Course  Procedures (including critical care time) Labs Review Labs Reviewed - No data to display  Imaging Review Dg Chest 2 View  08/22/2014   CLINICAL DATA:  Cough and fever for 2 weeks  EXAM: CHEST  2 VIEW  COMPARISON:  08/13/2014  FINDINGS: Cardiac shadow is stable. The lungs are well aerated bilaterally. No focal infiltrate or sizable effusion is seen. No acute bony abnormality is noted.  IMPRESSION: No active cardiopulmonary disease. Should be noted the patient was double shielded for this exam due to the pregnant state.   Electronically Signed   By: Alcide CleverMark  Lukens M.D.   On: 08/22/2014 15:59     EKG Interpretation None      MDM   Final diagnoses:  Cough  Viral syndrome    With a recent viral type syndrome and now worsening cough and question of fever patient had double shielding of her abdomen and x-rays were obtained. Chest x-ray shows no pneumonia. Discussed with him and is very likely viral. Recommended rest, fluids, Phenergan for nausea and OB follow-up. She will times ask if she can "something for that the pain from the cough and from the body aches". Recommended simple Tylenol and rest.    Rolland PorterMark Rosmery Duggin, MD 08/22/14 [phone]

## 2014-08-22 NOTE — Discharge Instructions (Signed)
Cough, Adult ° A cough is a reflex that helps clear your throat and airways. It can help heal the body or may be a reaction to an irritated airway. A cough may only last 2 or 3 weeks (acute) or may last more than 8 weeks (chronic).  °CAUSES °Acute cough: °· Viral or bacterial infections. °Chronic cough: °· Infections. °· Allergies. °· Asthma. °· Post-nasal drip. °· Smoking. °· Heartburn or acid reflux. °· Some medicines. °· Chronic lung problems (COPD). °· Cancer. °SYMPTOMS  °· Cough. °· Fever. °· Chest pain. °· Increased breathing rate. °· High-pitched whistling sound when breathing (wheezing). °· Colored mucus that you cough up (sputum). °TREATMENT  °· A bacterial cough may be treated with antibiotic medicine. °· A viral cough must run its course and will not respond to antibiotics. °· Your caregiver may recommend other treatments if you have a chronic cough. °HOME CARE INSTRUCTIONS  °· Only take over-the-counter or prescription medicines for pain, discomfort, or fever as directed by your caregiver. Use cough suppressants only as directed by your caregiver. °· Use a cold steam vaporizer or humidifier in your bedroom or home to help loosen secretions. °· Sleep in a semi-upright position if your cough is worse at night. °· Rest as needed. °· Stop smoking if you smoke. °SEEK IMMEDIATE MEDICAL CARE IF:  °· You have pus in your sputum. °· Your cough starts to worsen. °· You cannot control your cough with suppressants and are losing sleep. °· You begin coughing up blood. °· You have difficulty breathing. °· You develop pain which is getting worse or is uncontrolled with medicine. °· You have a fever. °MAKE SURE YOU:  °· Understand these instructions. °· Will watch your condition. °· Will get help right away if you are not doing well or get worse. °Document Released: 01/17/2011 Document Revised: 10/13/2011 Document Reviewed: 01/17/2011 °ExitCare® Patient Information ©2015 [employer]. This information is not intended  to replace advice given to you by your health care provider. Make sure you discuss any questions you have with your health care provider. °Viral Infections °A virus is a type of germ. Viruses can cause: °· Minor sore throats. °· Aches and pains. °· Headaches. °· Runny nose. °· Rashes. °· Watery eyes. °· Tiredness. °· Coughs. °· Loss of appetite. °· Feeling sick to your stomach (nausea). °· Throwing up (vomiting). °· Watery poop (diarrhea). °HOME CARE  °· Only take medicines as told by your doctor. °· Drink enough water and fluids to keep your pee (urine) clear or pale yellow. Sports drinks are a good choice. °· Get plenty of rest and eat healthy. Soups and broths with crackers or rice are fine. °GET HELP RIGHT AWAY IF:  °· You have a very bad headache. °· You have shortness of breath. °· You have chest pain or neck pain. °· You have an unusual rash. °· You cannot stop throwing up. °· You have watery poop that does not stop. °· You cannot keep fluids down. °· You or your child has a temperature by mouth above 102° F (38.9° C), not controlled by medicine. °· Your baby is older than 3 months with a rectal temperature of 102° F (38.9° C) or higher. °· Your baby is 3 months old or younger with a rectal temperature of 100.4° F (38° C) or higher. °MAKE SURE YOU:  °· Understand these instructions. °· Will watch this condition. °· Will get help right away if you are not doing well or get worse. °Document Released:   Released: 07/03/2008 Document Revised: 10/13/2011 Document Reviewed: 11/26/2010 [facility] Patient Information 2015 [place], [place]. This information is not intended to replace advice given to you by your health care provider. Make sure you discuss any questions you have with your health care provider.

## 2014-09-06 ENCOUNTER — Other Ambulatory Visit (HOSPITAL_COMMUNITY): Payer: Self-pay | Admitting: Obstetrics and Gynecology

## 2014-09-06 ENCOUNTER — Ambulatory Visit (HOSPITAL_COMMUNITY)
Admission: RE | Admit: 2014-09-06 | Discharge: 2014-09-06 | Disposition: A | Discharge status: Home / Self Care | Payer: Medicaid Other | Source: Ambulatory Visit | Attending: Obstetrics and Gynecology | Admitting: Obstetrics and Gynecology

## 2014-09-06 ENCOUNTER — Encounter (HOSPITAL_COMMUNITY): Payer: Self-pay

## 2014-09-06 VITALS — BP 113/77 | HR 102 | Wt 233.0 lb

## 2014-09-06 DIAGNOSIS — F17210 Nicotine dependence, cigarettes, uncomplicated: Secondary | ICD-10-CM | POA: Diagnosis not present

## 2014-09-06 DIAGNOSIS — Z3A30 30 weeks gestation of pregnancy: Secondary | ICD-10-CM | POA: Diagnosis not present

## 2014-09-06 DIAGNOSIS — O365930 Maternal care for other known or suspected poor fetal growth, third trimester, not applicable or unspecified: Secondary | ICD-10-CM | POA: Insufficient documentation

## 2014-09-06 DIAGNOSIS — O3421 Maternal care for scar from previous cesarean delivery: Secondary | ICD-10-CM | POA: Insufficient documentation

## 2014-09-06 DIAGNOSIS — O99333 Smoking (tobacco) complicating pregnancy, third trimester: Secondary | ICD-10-CM | POA: Insufficient documentation

## 2014-09-06 DIAGNOSIS — O2602 Excessive weight gain in pregnancy, second trimester: Secondary | ICD-10-CM

## 2014-09-06 DIAGNOSIS — O365931 Maternal care for other known or suspected poor fetal growth, third trimester, fetus 1: Secondary | ICD-10-CM

## 2014-09-06 DIAGNOSIS — O99213 Obesity complicating pregnancy, third trimester: Secondary | ICD-10-CM | POA: Diagnosis not present

## 2014-09-06 DIAGNOSIS — O34219 Maternal care for unspecified type scar from previous cesarean delivery: Secondary | ICD-10-CM

## 2014-09-06 DIAGNOSIS — O99332 Smoking (tobacco) complicating pregnancy, second trimester: Secondary | ICD-10-CM

## 2014-09-06 NOTE — ED Notes (Signed)
Pt brought son to appt.  New visitation policy explained to pt.  Pt voices understanding that children under the age of 512 will no longer be allowed to accompany pt for Ultrasound.

## 2014-09-11 ENCOUNTER — Encounter: Payer: Self-pay | Admitting: Physical Therapy

## 2014-09-11 ENCOUNTER — Ambulatory Visit: Payer: Medicaid Other | Attending: Obstetrics and Gynecology | Admitting: Physical Therapy

## 2014-09-11 DIAGNOSIS — M5430 Sciatica, unspecified side: Secondary | ICD-10-CM | POA: Insufficient documentation

## 2014-09-11 DIAGNOSIS — M545 Low back pain, unspecified: Secondary | ICD-10-CM

## 2014-09-11 DIAGNOSIS — O2693 Pregnancy related conditions, unspecified, third trimester: Secondary | ICD-10-CM | POA: Diagnosis not present

## 2014-09-11 DIAGNOSIS — O26893 Other specified pregnancy related conditions, third trimester: Secondary | ICD-10-CM

## 2014-09-11 NOTE — Patient Instructions (Signed)
Angry Cat, All Fours   Kneel on hands and knees. Tuck chin and tighten stomach. Exhale and round back upward. Inhale and arch back downward. Hold each position _5-10__ seconds. Repeat _5__ times per session. Do __2_ sessions per day.  Copyright  VHI. All rights reserved.    Lumbar Side-Bend (All-Fours)   Tilt head and shoulder to right side, rotate same side hip toward head. Repeat ___5-10_ times per set. Do ___1_ sets per session. Do __2__ sessions per day.  http://orth.exer.us/244   Copyright  VHI. All rights reserved.       Flexion   Sitting on knees, fold body over legs and relax head and arms on floor. Extend arms as far forward as possible. Hold __30__ seconds. Repeat ___5_ times. Do __2_ sessions per day.  Copyright  VHI. All rights reserved.  Isometric Hold (Quadruped)   On hands and knees, slowly inhale, and then exhale. Pull navel toward spine and Hold for __5_ seconds. Continue to breathe in and out during hold. Rest for _10__ seconds. Repeat __5-10_ times. Do __2_ times a day.   Copyright  VHI. All rights reserved.  Bracing With Arm Raise (Quadruped)   On hands and knees find neutral spine. Tighten pelvic floor and abdominals and hold. Alternately lift arm to shoulder level. Repeat __5 times. Do _1_ times a day.   Copyright  VHI. All rights reserved.  Bracing With Leg Raise (Quadruped)   On hands and knees find neutral spine. Tighten pelvic floor and abdominals and hold. Alternating legs, straighten and lift to hip level. Repeat __5_ times. Do _1-2__ times a day.   Copyright  VHI. All rights reserved.  Bracing With Arm / Leg Raise (Quadruped)   On hands and knees find neutral spine. Tighten pelvic floor and abdominals and hold. Alternating, lift arm to shoulder level and opposite leg to hip level. Repeat __5-10_ times. Do __1_ times a day.   Copyright  VHI. All rights reserved.

## 2014-09-11 NOTE — Therapy (Signed)
[place], [place], [address] Phone: [phone]   Fax:  [phone]  Physical Therapy Evaluation  Patient Details  Name: Cynthia Odonnell MRN: [id number] Date of Birth: 11-Mar-1986 Referring Provider:  Janyth Contes, *  Encounter Date: 09/11/2014      PT End of Session - 09/11/14 0947    Visit Number 1   Number of Visits 1   PT Start Time 0846   PT Stop Time 0930   PT Time Calculation (min) 44 min   Activity Tolerance Patient tolerated treatment well      Past Medical History  Diagnosis Date  . Optic neuritis   . Headache(784.0)   . Depression     Past Surgical History  Procedure Laterality Date  . Ceacerian    . Cesarean section      LMP 02/05/2014  Visit Diagnosis:  Low back pain during pregnancy in third trimester  Sciatic leg pain      Subjective Assessment - 09/11/14 0857    Symptoms  Low back pain radiates into Rt.>Lt. , denies sensory changes, has LE weakness when pain is increased    Limitations Sitting;House hold activities;Standing;Walking;Lifting   Diagnostic tests none   Currently in Pain? Yes   Pain Score 3    Pain Location Back   Pain Orientation Lower;Right   Pain Descriptors / Indicators Sore   Pain Type Chronic pain   Pain Onset More than a month ago   Pain Frequency Intermittent   Aggravating Factors  stepping down/up, activity, stairs   Pain Relieving Factors pain meds, heat, supine   Effect of Pain on Daily Activities unable to enjoy to enjoy   Multiple Pain Sites No          [facility] PT Assessment - 09/11/14 0901    Assessment   Medical Diagnosis low back pain with pregnancy   Onset Date --  at 2 mos pregnancy   Next MD Visit goes regularly   Prior Therapy --  no   Precautions   Precautions None   Restrictions   Weight Bearing Restrictions No   Balance Screen   Has the patient fallen in the past 6 months Yes   How many times? 2 slick floor x2    Has the patient had a decrease in activity level because of a fear of falling?  No   Is the patient reluctant to leave their home because of a fear of falling?  No  uses more caution   Sensation   Light Touch Appears Intact   Posture/Postural Control   Posture/Postural Control Postural limitations   Postural Limitations Rounded Shoulders;Forward head;Increased lumbar lordosis;Anterior pelvic tilt;Right pelvic obliquity   AROM   Lumbar Flexion 50%   Lumbar Extension  WNL   Lumbar - Right Rotation WNL   Lumbar - Left Rotation WNL   Strength   Right Hip Flexion 4/5   Left Hip Flexion 4+/5   Right Knee Flexion 3+/5   Right Knee Extension 4+/5   Left Knee Flexion 4/5   Left Knee Extension 4+/5   Right Ankle Dorsiflexion 3+/5   Left Ankle Dorsiflexion 5/5   Palpation   Palpation sacrum    Gaenslen's test   Findings Positive   Side  Right   Balance   Balance Assessed Yes   Static Standing Balance   Static Standing - Balance Support No upper extremity supported   Static Standing - Level of Assistance 5: Stand by assistance  Static Standing Balance -  Activities  Single Leg Stance - Right Leg   Static Standing - Comment/# of Minutes less than 5 sec          [facility] Adult PT Treatment/Exercise - 09/11/14 0957    Lumbar Exercises: Quadruped   Madcat/Old Horse 5 reps   Single Arm Raise --  HEP   Straight Leg Raise --  HEP   Opposite Arm/Leg Raise --  HEP   Other Quadruped Lumbar Exercises child's pose           PT Education - 09/11/14 0947    Education provided Yes   Education Details PT, SIJ instability, pregnancy posture and quadruped for HEP   Person(s) Educated Patient   Methods Explanation;Handout;Demonstration;Tactile cues   Comprehension Verbalized understanding;Returned demonstration;Verbal cues required            Plan - 09/11/14 0949    Clinical Impression Statement This patient presents at 7 mos pregnant with low back pain.  Her insurance does not  cover PT for this diagnosis.  SHe was given HEP and resources to aid in pain relief including purchasing an SI belt and MET for correction of pelvic rotation (Rt. anterior ilium).     Pt will benefit from skilled therapeutic intervention in order to improve on the following deficits Difficulty walking;Improper body mechanics;Postural dysfunction;Decreased activity tolerance;Pain;Decreased mobility;Decreased strength   Rehab Potential Good   PT Frequency One time visit   PT Treatment/Interventions Therapeutic exercise;ADLs/Self Care Home Management   PT Next Visit Plan NA   PT Home Exercise Plan Quadruped   Consulted and Agree with Plan of Care Patient         Problem List Patient Active Problem List   Diagnosis Date Noted  . Poor fetal growth affecting management of mother in third trimester, antepartum   . [redacted] weeks gestation of pregnancy   . Maternal morbid obesity in second trimester, antepartum   . [redacted] weeks gestation of pregnancy   . Evaluate anatomy not seen on prior sonogram   . Tobacco smoking affecting pregnancy in second trimester, antepartum   . Obesity affecting pregnancy in second trimester   . Uterine scar from previous cesarean delivery, antepartum complication   . Encounter for fetal anatomic survey   . [redacted] weeks gestation of pregnancy     Cledith Abdou 09/11/2014, 10:02 AM  [place], [place], [address] Phone: [phone]   Fax:  [phone]

## 2014-09-23 ENCOUNTER — Encounter (HOSPITAL_COMMUNITY): Payer: Self-pay | Admitting: *Deleted

## 2014-09-23 ENCOUNTER — Emergency Department (HOSPITAL_BASED_OUTPATIENT_CLINIC_OR_DEPARTMENT_OTHER)
Admission: EM | Admit: 2014-09-23 | Discharge: 2014-09-23 | Disposition: A | Discharge status: Home / Self Care | Payer: Medicaid Other

## 2014-09-23 ENCOUNTER — Inpatient Hospital Stay (HOSPITAL_COMMUNITY)
Admission: AD | Admit: 2014-09-23 | Discharge: 2014-09-23 | Disposition: A | Discharge status: Home / Self Care | Payer: Medicaid Other | Source: Ambulatory Visit | Attending: Obstetrics and Gynecology | Admitting: Obstetrics and Gynecology

## 2014-09-23 DIAGNOSIS — M549 Dorsalgia, unspecified: Secondary | ICD-10-CM | POA: Diagnosis not present

## 2014-09-23 DIAGNOSIS — O26893 Other specified pregnancy related conditions, third trimester: Secondary | ICD-10-CM

## 2014-09-23 DIAGNOSIS — Z3A33 33 weeks gestation of pregnancy: Secondary | ICD-10-CM

## 2014-09-23 DIAGNOSIS — O99891 Other specified diseases and conditions complicating pregnancy: Secondary | ICD-10-CM

## 2014-09-23 DIAGNOSIS — Z3A32 32 weeks gestation of pregnancy: Secondary | ICD-10-CM | POA: Insufficient documentation

## 2014-09-23 DIAGNOSIS — M5431 Sciatica, right side: Secondary | ICD-10-CM

## 2014-09-23 DIAGNOSIS — M5430 Sciatica, unspecified side: Secondary | ICD-10-CM | POA: Insufficient documentation

## 2014-09-23 DIAGNOSIS — O9989 Other specified diseases and conditions complicating pregnancy, childbirth and the puerperium: Secondary | ICD-10-CM | POA: Insufficient documentation

## 2014-09-23 LAB — URINALYSIS, ROUTINE W REFLEX MICROSCOPIC
Bilirubin Urine: NEGATIVE
Glucose, UA: NEGATIVE mg/dL
Hgb urine dipstick: NEGATIVE
Ketones, ur: 15 mg/dL — AB
Leukocytes, UA: NEGATIVE
Nitrite: NEGATIVE
Protein, ur: NEGATIVE mg/dL
Specific Gravity, Urine: 1.02 (ref 1.005–1.030)
Urobilinogen, UA: 0.2 mg/dL (ref 0.0–1.0)
pH: 6 (ref 5.0–8.0)

## 2014-09-23 NOTE — MAU Note (Signed)
Pt has had pain in her hips and right buttock since yesterday.  Had percocet for it but ran out.  Went to physical therapy and has tried the recommended exercises but with no success. Denies LOF/VB.

## 2014-09-23 NOTE — Discharge Instructions (Signed)

## 2014-09-23 NOTE — MAU Provider Note (Signed)
History     CSN: [id number]  Arrival date and time: 09/23/14 [id number]   First Provider Initiated Contact with Patient 09/23/14 1906      Chief Complaint  Patient presents with  . pain    HPI Cynthia Odonnell 29 y.o. [id number] @[redacted]w[redacted]d  presents to MAU complaining of hip and buttock pain.  It has been ongoing greater than one month. Her OB, Dr. Senaida Oresichardson, sent her to PT and has provided some pain medication for her: percocet.  She received 12 tablets on Monday which lasted through Friday.  She talked with Dr. Jackelyn KnifeMeisinger whom acknowledged that she did not need to come to MAU and instead advised that she use heat.  This was ineffective and pt came here.  Pain is now 8.5/10.  This is also causing her to have headaches.  She vomited yesterday but no nausea or vomiting today.  Also denies abdominal pain, LOF, vaginal bleeding or discharge, dysuria.  She has an support belt at home which she has not been wearing.  She endorses good fetal movement.  OB History    Gravida Para Term Preterm AB TAB SAB Ectopic Multiple Living   4 1 1  2 2    1       Past Medical History  Diagnosis Date  . Optic neuritis   . Headache(784.0)   . Depression     Past Surgical History  Procedure Laterality Date  . Ceacerian    . Cesarean section      Family History  Problem Relation Age of Onset  . Other Neg Hx     History  Substance Use Topics  . Smoking status: Current Every Day Smoker -- 1.00 packs/day  . Smokeless tobacco: Never Used  . Alcohol Use: No    Allergies:  Allergies  Allergen Reactions  . Amoxicillin Hives  . Codeine Itching  . Penicillins Hives  . Augmentin [Amoxicillin-Pot Clavulanate] Hives and Rash  . Tramadol Itching and Rash    Prescriptions prior to admission  Medication Sig Dispense Refill Last Dose  . albuterol (PROVENTIL HFA;VENTOLIN HFA) 108 (90 BASE) MCG/ACT inhaler Inhale 1-2 puffs into the lungs every 6 (six) hours as needed for wheezing or shortness of breath. 1 Inhaler 0  Taking  . oseltamivir (TAMIFLU) 75 MG capsule Take 1 capsule (75 mg total) by mouth 2 (two) times daily. (Patient not taking: Reported on 09/06/2014) 10 capsule 0 Not Taking  . oxyCODONE-acetaminophen (PERCOCET/ROXICET) 5-325 MG per tablet Take 1 tablet by mouth every 4 (four) hours as needed. 8 tablet 0 Taking  . Prenatal Vit-Fe Fumarate-FA (PRENATAL VITAMIN PO) Take by mouth.   Taking  . promethazine (PHENERGAN) 25 MG tablet Take 1 tablet (25 mg total) by mouth every 6 (six) hours as needed for nausea or vomiting. 10 tablet 0 Taking  . Sertraline HCl (ZOLOFT PO) Take by mouth.   Taking    ROS Pertinent ROS in HPI  Physical Exam   Blood pressure 125/66, pulse 107, temperature 97.9 F (36.6 C), temperature source Oral, resp. rate 18, height 5\' 4"  (1.626 m), weight 239 lb 3.2 oz (108.5 kg), last menstrual period 02/05/2014.  Physical Exam  Constitutional: She is oriented to person, place, and time. She appears well-developed and well-nourished. No distress.  HENT:  Head: Normocephalic and atraumatic.  Eyes: EOM are normal.  Neck: Normal range of motion.  Cardiovascular: Normal rate and regular rhythm.   Respiratory: Effort normal. No respiratory distress.  GI: Soft. She exhibits no distension. There is  tenderness.  Mild LLQ tenderness  Musculoskeletal: Normal range of motion.  Pt reports pain in left anterior groin, left posterior back at sciatic region and right posterior back at sciatic region with radiation down right leg.  UNABLE to reproduce pain.  Pt has excellent bed mobility.   Neurological: She is alert and oriented to person, place, and time.  Skin: Skin is warm and dry.  Psychiatric: She has a normal mood and affect.    MAU Course  Procedures  MDM Discussed with Dr. Jackelyn Knife.  He states that pt should receive her pain management for ongoing problem through clinic.  Pt has appt on Monday and she can discuss with her at that time.  MD agreeable to discharge.    Assessment  and Plan  A:  1. Back pain complicating pregnancy, third trimester   2. Sciatic nerve pain, right      P: Discharge to home Ice/heat/Tylenol for pain management.   Follow up in clinic Monday, 2/22.  Okay to use topical menthol gel.   Patient may return to MAU as needed or if her condition were to change or worsen   Bertram Denver 09/23/2014, 7:07 PM

## 2014-09-24 NOTE — Progress Notes (Signed)
FHT from 2-20 reviewed.  Tracing is inconsistent, but what is there is reactive, no significant decels, no regular ctx.

## 2014-09-25 ENCOUNTER — Telehealth: Payer: Self-pay | Admitting: *Deleted

## 2014-09-25 NOTE — Telephone Encounter (Signed)
Dr .Senaida Oresichardson ob-gyn would like for you to give her a call @ [phone] and ask for [facility].Marland Kitchen.Marland Kitchen.Thanks

## 2014-09-27 ENCOUNTER — Encounter (HOSPITAL_COMMUNITY): Payer: Self-pay

## 2014-09-27 ENCOUNTER — Ambulatory Visit (HOSPITAL_COMMUNITY)
Admission: RE | Admit: 2014-09-27 | Discharge: 2014-09-27 | Disposition: A | Discharge status: Home / Self Care | Payer: Medicaid Other | Source: Ambulatory Visit | Attending: Obstetrics and Gynecology | Admitting: Obstetrics and Gynecology

## 2014-09-27 DIAGNOSIS — Z3A33 33 weeks gestation of pregnancy: Secondary | ICD-10-CM | POA: Insufficient documentation

## 2014-09-27 DIAGNOSIS — F17200 Nicotine dependence, unspecified, uncomplicated: Secondary | ICD-10-CM | POA: Insufficient documentation

## 2014-09-27 DIAGNOSIS — O2602 Excessive weight gain in pregnancy, second trimester: Secondary | ICD-10-CM | POA: Insufficient documentation

## 2014-09-27 DIAGNOSIS — O99213 Obesity complicating pregnancy, third trimester: Secondary | ICD-10-CM | POA: Diagnosis not present

## 2014-09-27 DIAGNOSIS — O99333 Smoking (tobacco) complicating pregnancy, third trimester: Secondary | ICD-10-CM | POA: Diagnosis present

## 2014-09-27 DIAGNOSIS — O3421 Maternal care for scar from previous cesarean delivery: Secondary | ICD-10-CM | POA: Insufficient documentation

## 2014-09-27 DIAGNOSIS — F17210 Nicotine dependence, cigarettes, uncomplicated: Secondary | ICD-10-CM | POA: Insufficient documentation

## 2014-09-28 ENCOUNTER — Other Ambulatory Visit (HOSPITAL_COMMUNITY): Payer: Self-pay | Admitting: Obstetrics and Gynecology

## 2014-09-28 DIAGNOSIS — F17210 Nicotine dependence, cigarettes, uncomplicated: Secondary | ICD-10-CM

## 2014-09-28 DIAGNOSIS — O99213 Obesity complicating pregnancy, third trimester: Principal | ICD-10-CM

## 2014-09-28 DIAGNOSIS — Z98891 History of uterine scar from previous surgery: Secondary | ICD-10-CM

## 2014-09-28 DIAGNOSIS — Z3A37 37 weeks gestation of pregnancy: Secondary | ICD-10-CM

## 2014-10-25 ENCOUNTER — Encounter (HOSPITAL_COMMUNITY): Payer: Self-pay

## 2014-10-25 ENCOUNTER — Ambulatory Visit (HOSPITAL_COMMUNITY)
Admission: RE | Admit: 2014-10-25 | Discharge: 2014-10-25 | Disposition: A | Discharge status: Home / Self Care | Payer: Medicaid Other | Source: Ambulatory Visit | Attending: Obstetrics and Gynecology | Admitting: Obstetrics and Gynecology

## 2014-10-25 DIAGNOSIS — Z3A37 37 weeks gestation of pregnancy: Secondary | ICD-10-CM | POA: Diagnosis not present

## 2014-10-25 DIAGNOSIS — Z98891 History of uterine scar from previous surgery: Secondary | ICD-10-CM

## 2014-10-25 DIAGNOSIS — O3421 Maternal care for scar from previous cesarean delivery: Secondary | ICD-10-CM | POA: Insufficient documentation

## 2014-10-25 DIAGNOSIS — O99213 Obesity complicating pregnancy, third trimester: Secondary | ICD-10-CM | POA: Insufficient documentation

## 2014-10-25 DIAGNOSIS — O99333 Smoking (tobacco) complicating pregnancy, third trimester: Secondary | ICD-10-CM | POA: Diagnosis not present

## 2014-10-25 DIAGNOSIS — F17210 Nicotine dependence, cigarettes, uncomplicated: Secondary | ICD-10-CM

## 2014-11-02 ENCOUNTER — Encounter (HOSPITAL_COMMUNITY): Payer: Self-pay | Admitting: *Deleted

## 2014-11-02 NOTE — Patient Instructions (Signed)
   Your procedure is scheduled on: Monday, April 4  [facility] at: 6 AM Pick up the phone at the desk and dial [phone] and inform us of your arrival.  Please call this number if you have any problems the morning of surgery: [phone]  Remember: Do not eat or drink after midnight: Sunday Take these medicines the morning of surgery with a SIP OF WATER:  Do not wear jewelry, make-up, or FINGER nail polish No metal in your hair or on your body. Do not wear lotions, powders, perfumes.  You may wear deodorant.  Do not bring valuables to the hospital. Contacts, dentures or bridgework may not be worn into surgery.  Leave suitcase in the car. After Surgery it may be brought to your room. For patients being admitted to the hospital, checkout time is 11:00am the day of discharge.

## 2014-11-03 ENCOUNTER — Encounter (HOSPITAL_COMMUNITY)
Admission: RE | Admit: 2014-11-03 | Discharge: 2014-11-03 | Disposition: A | Discharge status: Home / Self Care | Payer: Medicaid Other | Source: Ambulatory Visit | Attending: Obstetrics and Gynecology | Admitting: Obstetrics and Gynecology

## 2014-11-03 ENCOUNTER — Encounter (HOSPITAL_COMMUNITY): Payer: Self-pay

## 2014-11-03 HISTORY — DX: Nicotine dependence, unspecified, uncomplicated: F17.200

## 2014-11-03 HISTORY — DX: Unspecified abnormal cytological findings in specimens from cervix uteri: R87.619

## 2014-11-03 HISTORY — DX: Anxiety disorder, unspecified: F41.9

## 2014-11-03 HISTORY — DX: Gastro-esophageal reflux disease without esophagitis: K21.9

## 2014-11-03 HISTORY — DX: Bronchitis, not specified as acute or chronic: J40

## 2014-11-03 LAB — ABO/RH: ABO/RH(D): O POS

## 2014-11-03 LAB — CBC
HEMATOCRIT: 37.3 % (ref 36.0–46.0)
Hemoglobin: 13.2 g/dL (ref 12.0–15.0)
MCH: 31.2 pg (ref 26.0–34.0)
MCHC: 35.4 g/dL (ref 30.0–36.0)
MCV: 88.2 fL (ref 78.0–100.0)
PLATELETS: 146 10*3/uL — AB (ref 150–400)
RBC: 4.23 MIL/uL (ref 3.87–5.11)
RDW: 14.4 % (ref 11.5–15.5)
WBC: 10.2 10*3/uL (ref 4.0–10.5)

## 2014-11-03 LAB — TYPE AND SCREEN
ABO/RH(D): O POS
ANTIBODY SCREEN: NEGATIVE

## 2014-11-03 NOTE — Patient Instructions (Addendum)
   Your procedure is scheduled on:  Monday, April 4  [facility] at: 6 AM Pick up the phone at the desk and dial [phone] and inform us of your arrival.  Please call this number if you have any problems the morning of surgery: [phone]  Remember: Do not eat or drink after midnight: Sunday Take these medicines the morning of surgery with a SIP OF WATER:  None.  Bring albuterol inhaler with you on day of surgery.  Do not wear jewelry, make-up, or FINGER nail polish No metal in your hair or on your body. Do not wear lotions, powders, perfumes.  You may wear deodorant.  Do not bring valuables to the hospital. Contacts, dentures or bridgework may not be worn into surgery.  Leave suitcase in the car. After Surgery it may be brought to your room. For patients being admitted to the hospital, checkout time is 11:00am the day of discharge.  Home with husband Darryl.

## 2014-11-04 ENCOUNTER — Inpatient Hospital Stay (HOSPITAL_COMMUNITY): Payer: Medicaid Other | Admitting: Anesthesiology

## 2014-11-04 ENCOUNTER — Encounter (HOSPITAL_COMMUNITY): Payer: Self-pay | Admitting: Anesthesiology

## 2014-11-04 ENCOUNTER — Encounter (HOSPITAL_COMMUNITY): Payer: Self-pay | Admitting: *Deleted

## 2014-11-04 ENCOUNTER — Inpatient Hospital Stay (HOSPITAL_COMMUNITY)
Admission: AD | Admit: 2014-11-04 | Discharge: 2014-11-06 | DRG: 766 | Disposition: A | Discharge status: Home / Self Care | Payer: Medicaid Other | Source: Ambulatory Visit | Attending: Obstetrics and Gynecology | Admitting: Obstetrics and Gynecology

## 2014-11-04 ENCOUNTER — Encounter (HOSPITAL_COMMUNITY)
Admission: AD | Disposition: A | Discharge status: Home / Self Care | Payer: Self-pay | Source: Ambulatory Visit | Attending: Obstetrics and Gynecology

## 2014-11-04 DIAGNOSIS — R21 Rash and other nonspecific skin eruption: Secondary | ICD-10-CM | POA: Diagnosis present

## 2014-11-04 DIAGNOSIS — O99334 Smoking (tobacco) complicating childbirth: Secondary | ICD-10-CM | POA: Diagnosis present

## 2014-11-04 DIAGNOSIS — Z3A38 38 weeks gestation of pregnancy: Secondary | ICD-10-CM | POA: Diagnosis present

## 2014-11-04 DIAGNOSIS — O99344 Other mental disorders complicating childbirth: Secondary | ICD-10-CM | POA: Diagnosis present

## 2014-11-04 DIAGNOSIS — Z98891 History of uterine scar from previous surgery: Secondary | ICD-10-CM

## 2014-11-04 DIAGNOSIS — K219 Gastro-esophageal reflux disease without esophagitis: Secondary | ICD-10-CM | POA: Diagnosis present

## 2014-11-04 DIAGNOSIS — O9962 Diseases of the digestive system complicating childbirth: Secondary | ICD-10-CM | POA: Diagnosis present

## 2014-11-04 DIAGNOSIS — F319 Bipolar disorder, unspecified: Secondary | ICD-10-CM | POA: Diagnosis not present

## 2014-11-04 DIAGNOSIS — O4292 Full-term premature rupture of membranes, unspecified as to length of time between rupture and onset of labor: Secondary | ICD-10-CM | POA: Diagnosis not present

## 2014-11-04 DIAGNOSIS — O9989 Other specified diseases and conditions complicating pregnancy, childbirth and the puerperium: Secondary | ICD-10-CM | POA: Diagnosis present

## 2014-11-04 DIAGNOSIS — F17210 Nicotine dependence, cigarettes, uncomplicated: Secondary | ICD-10-CM | POA: Diagnosis present

## 2014-11-04 DIAGNOSIS — O3421 Maternal care for scar from previous cesarean delivery: Secondary | ICD-10-CM | POA: Diagnosis present

## 2014-11-04 HISTORY — DX: History of uterine scar from previous surgery: Z98.891

## 2014-11-04 LAB — RAPID HIV SCREEN (HIV 1/2 AB+AG)
HIV 1/2 Antibodies: NONREACTIVE
HIV-1 P24 Antigen - HIV24: NONREACTIVE

## 2014-11-04 LAB — RPR: RPR: NONREACTIVE

## 2014-11-04 LAB — POCT FERN TEST

## 2014-11-04 SURGERY — Surgical Case
Anesthesia: Spinal | Site: Abdomen

## 2014-11-04 MED ORDER — LACTATED RINGERS IV SOLN
INTRAVENOUS | Status: DC | PRN
  Administered 2014-11-04 (×3): via INTRAVENOUS

## 2014-11-04 MED ORDER — OXYTOCIN 40 UNITS IN LACTATED RINGERS INFUSION - SIMPLE MED: 62.5000 mL/h | INTRAVENOUS | Status: AC

## 2014-11-04 MED ORDER — DIPHENHYDRAMINE HCL 25 MG PO CAPS: 25.0000 mg | ORAL_CAPSULE | ORAL | Status: DC | PRN

## 2014-11-04 MED ORDER — NALOXONE HCL 1 MG/ML IJ SOLN: 1.0000 ug/kg/h | INTRAVENOUS | Status: DC | PRN

## 2014-11-04 MED ORDER — WITCH HAZEL-GLYCERIN EX PADS
1.0000 | MEDICATED_PAD | CUTANEOUS | Status: DC | PRN
Start: 2014-11-04 — End: 2014-11-06

## 2014-11-04 MED ORDER — LACTATED RINGERS IV SOLN
INTRAVENOUS | Status: DC
  Administered 2014-11-04: 16:00:00 via INTRAVENOUS

## 2014-11-04 MED ORDER — ALBUTEROL SULFATE (2.5 MG/3ML) 0.083% IN NEBU
2.5000 mg | INHALATION_SOLUTION | Freq: Once | RESPIRATORY_TRACT | Status: AC
  Administered 2014-11-04: 2.5 mg via RESPIRATORY_TRACT
  Filled 2014-11-04: qty 3

## 2014-11-04 MED ORDER — SODIUM CHLORIDE 0.9 % IJ SOLN: 3.0000 mL | INTRAMUSCULAR | Status: DC | PRN

## 2014-11-04 MED ORDER — HYDROMORPHONE HCL 1 MG/ML IJ SOLN
INTRAMUSCULAR | Status: AC
  Administered 2014-11-04: 0.5 mg via INTRAVENOUS
  Filled 2014-11-04: qty 1

## 2014-11-04 MED ORDER — SENNOSIDES-DOCUSATE SODIUM 8.6-50 MG PO TABS
2.0000 | ORAL_TABLET | ORAL | Status: DC
  Administered 2014-11-05: 2 via ORAL
  Filled 2014-11-04 (×2): qty 2

## 2014-11-04 MED ORDER — NALBUPHINE HCL 10 MG/ML IJ SOLN: 5.0000 mg | INTRAMUSCULAR | Status: DC | PRN

## 2014-11-04 MED ORDER — NALBUPHINE HCL 10 MG/ML IJ SOLN: 5.0000 mg | Freq: Once | INTRAMUSCULAR | Status: AC | PRN

## 2014-11-04 MED ORDER — BUTORPHANOL TARTRATE 1 MG/ML IJ SOLN
1.0000 mg | Freq: Once | INTRAMUSCULAR | Status: AC
Start: 2014-11-04 — End: 2014-11-04
  Administered 2014-11-04: 1 mg via INTRAVENOUS
  Filled 2014-11-04: qty 1

## 2014-11-04 MED ORDER — DIPHENHYDRAMINE HCL 25 MG PO CAPS: 25.0000 mg | ORAL_CAPSULE | Freq: Four times a day (QID) | ORAL | Status: DC | PRN

## 2014-11-04 MED ORDER — FAMOTIDINE IN NACL 20-0.9 MG/50ML-% IV SOLN
20.0000 mg | Freq: Once | INTRAVENOUS | Status: AC
  Administered 2014-11-04: 20 mg via INTRAVENOUS
  Filled 2014-11-04: qty 50

## 2014-11-04 MED ORDER — HYDROMORPHONE HCL 1 MG/ML IJ SOLN
0.2500 mg | INTRAMUSCULAR | Status: DC | PRN
  Administered 2014-11-04 (×2): 0.5 mg via INTRAVENOUS

## 2014-11-04 MED ORDER — MORPHINE SULFATE (PF) 0.5 MG/ML IJ SOLN
INTRAMUSCULAR | Status: DC | PRN
  Administered 2014-11-04: .1 mg via INTRATHECAL

## 2014-11-04 MED ORDER — ZOLPIDEM TARTRATE 5 MG PO TABS: 5.0000 mg | ORAL_TABLET | Freq: Every evening | ORAL | Status: DC | PRN

## 2014-11-04 MED ORDER — LANOLIN HYDROUS EX OINT: 1.0000 "application " | TOPICAL_OINTMENT | CUTANEOUS | Status: DC | PRN

## 2014-11-04 MED ORDER — NALOXONE HCL 0.4 MG/ML IJ SOLN: 0.4000 mg | INTRAMUSCULAR | Status: DC | PRN

## 2014-11-04 MED ORDER — SIMETHICONE 80 MG PO CHEW
80.0000 mg | CHEWABLE_TABLET | ORAL | Status: DC
  Administered 2014-11-05 – 2014-11-06 (×2): 80 mg via ORAL
  Filled 2014-11-04 (×2): qty 1

## 2014-11-04 MED ORDER — IBUPROFEN 600 MG PO TABS
600.0000 mg | ORAL_TABLET | Freq: Four times a day (QID) | ORAL | Status: DC
  Administered 2014-11-04 – 2014-11-06 (×7): 600 mg via ORAL
  Filled 2014-11-04 (×7): qty 1

## 2014-11-04 MED ORDER — MENTHOL 3 MG MT LOZG: 1.0000 | LOZENGE | OROMUCOSAL | Status: DC | PRN

## 2014-11-04 MED ORDER — MEPERIDINE HCL 25 MG/ML IJ SOLN: 6.2500 mg | INTRAMUSCULAR | Status: DC | PRN

## 2014-11-04 MED ORDER — DIPHENHYDRAMINE HCL 50 MG/ML IJ SOLN: 12.5000 mg | INTRAMUSCULAR | Status: DC | PRN

## 2014-11-04 MED ORDER — OXYCODONE-ACETAMINOPHEN 5-325 MG PO TABS
1.0000 | ORAL_TABLET | ORAL | Status: DC | PRN
Start: 2014-11-04 — End: 2014-11-06
  Administered 2014-11-05 – 2014-11-06 (×8): 1 via ORAL
  Filled 2014-11-04 (×9): qty 1

## 2014-11-04 MED ORDER — METOCLOPRAMIDE HCL 5 MG/ML IJ SOLN
INTRAMUSCULAR | Status: AC
  Filled 2014-11-04: qty 2

## 2014-11-04 MED ORDER — OXYTOCIN 40 UNITS IN LACTATED RINGERS INFUSION - SIMPLE MED
INTRAVENOUS | Status: DC | PRN
  Administered 2014-11-04: 40 [IU] via INTRAVENOUS

## 2014-11-04 MED ORDER — CITRIC ACID-SODIUM CITRATE 334-500 MG/5ML PO SOLN
30.0000 mL | Freq: Once | ORAL | Status: AC
  Administered 2014-11-04: 30 mL via ORAL
  Filled 2014-11-04: qty 15

## 2014-11-04 MED ORDER — ONDANSETRON HCL 4 MG/2ML IJ SOLN: 4.0000 mg | Freq: Three times a day (TID) | INTRAMUSCULAR | Status: DC | PRN

## 2014-11-04 MED ORDER — MEASLES, MUMPS & RUBELLA VAC ~~LOC~~ INJ: 0.5000 mL | INJECTION | Freq: Once | SUBCUTANEOUS | Status: DC

## 2014-11-04 MED ORDER — ONDANSETRON HCL 4 MG/2ML IJ SOLN
INTRAMUSCULAR | Status: DC | PRN
  Administered 2014-11-04: 4 mg via INTRAVENOUS

## 2014-11-04 MED ORDER — PHENYLEPHRINE 8 MG IN D5W 100 ML (0.08MG/ML) PREMIX OPTIME
INJECTION | INTRAVENOUS | Status: DC | PRN
  Administered 2014-11-04: 60 ug/min via INTRAVENOUS

## 2014-11-04 MED ORDER — FENTANYL CITRATE 0.05 MG/ML IJ SOLN
INTRAMUSCULAR | Status: AC
  Filled 2014-11-04: qty 2

## 2014-11-04 MED ORDER — PHENYLEPHRINE 8 MG IN D5W 100 ML (0.08MG/ML) PREMIX OPTIME
INJECTION | INTRAVENOUS | Status: AC
  Filled 2014-11-04: qty 100

## 2014-11-04 MED ORDER — MORPHINE SULFATE 0.5 MG/ML IJ SOLN
INTRAMUSCULAR | Status: AC
  Filled 2014-11-04: qty 10

## 2014-11-04 MED ORDER — ACETAMINOPHEN 500 MG PO TABS
1000.0000 mg | ORAL_TABLET | Freq: Once | ORAL | Status: AC
  Administered 2014-11-04: 1000 mg via ORAL

## 2014-11-04 MED ORDER — ACETAMINOPHEN 500 MG PO TABS
ORAL_TABLET | ORAL | Status: AC
  Filled 2014-11-04: qty 2

## 2014-11-04 MED ORDER — TETANUS-DIPHTH-ACELL PERTUSSIS 5-2.5-18.5 LF-MCG/0.5 IM SUSP
0.5000 mL | Freq: Once | INTRAMUSCULAR | Status: DC
Start: 2014-11-05 — End: 2014-11-04

## 2014-11-04 MED ORDER — SERTRALINE HCL 50 MG PO TABS
150.0000 mg | ORAL_TABLET | Freq: Every day | ORAL | Status: DC
  Administered 2014-11-04 – 2014-11-05 (×2): 150 mg via ORAL
  Filled 2014-11-04 (×2): qty 1

## 2014-11-04 MED ORDER — GENTAMICIN SULFATE 40 MG/ML IJ SOLN
INTRAVENOUS | Status: AC
  Administered 2014-11-04: 115 mL via INTRAVENOUS
  Filled 2014-11-04: qty 9.75

## 2014-11-04 MED ORDER — ACETAMINOPHEN 325 MG PO TABS: 650.0000 mg | ORAL_TABLET | ORAL | Status: DC | PRN

## 2014-11-04 MED ORDER — SIMETHICONE 80 MG PO CHEW: 80.0000 mg | CHEWABLE_TABLET | ORAL | Status: DC | PRN

## 2014-11-04 MED ORDER — ONDANSETRON HCL 4 MG/2ML IJ SOLN
INTRAMUSCULAR | Status: AC
  Filled 2014-11-04: qty 2

## 2014-11-04 MED ORDER — SIMETHICONE 80 MG PO CHEW
80.0000 mg | CHEWABLE_TABLET | Freq: Three times a day (TID) | ORAL | Status: DC
  Administered 2014-11-04 – 2014-11-06 (×4): 80 mg via ORAL
  Filled 2014-11-04 (×5): qty 1

## 2014-11-04 MED ORDER — SCOPOLAMINE 1 MG/3DAYS TD PT72
MEDICATED_PATCH | TRANSDERMAL | Status: AC
  Filled 2014-11-04: qty 1

## 2014-11-04 MED ORDER — NICOTINE 21 MG/24HR TD PT24
21.0000 mg | MEDICATED_PATCH | Freq: Every day | TRANSDERMAL | Status: DC
  Administered 2014-11-04 – 2014-11-06 (×2): 21 mg via TRANSDERMAL
  Filled 2014-11-04 (×3): qty 1

## 2014-11-04 MED ORDER — FENTANYL CITRATE 0.05 MG/ML IJ SOLN
INTRAMUSCULAR | Status: DC | PRN
  Administered 2014-11-04: 25 ug via INTRATHECAL

## 2014-11-04 MED ORDER — CLINDAMYCIN PHOSPHATE 900 MG/50ML IV SOLN
900.0000 mg | Freq: Three times a day (TID) | INTRAVENOUS | Status: AC
  Administered 2014-11-04 – 2014-11-05 (×2): 900 mg via INTRAVENOUS
  Filled 2014-11-04 (×2): qty 50

## 2014-11-04 MED ORDER — OXYTOCIN 10 UNIT/ML IJ SOLN
INTRAMUSCULAR | Status: AC
Start: 2014-11-04 — End: 2014-11-04
  Filled 2014-11-04: qty 4

## 2014-11-04 MED ORDER — SCOPOLAMINE 1 MG/3DAYS TD PT72
1.0000 | MEDICATED_PATCH | Freq: Once | TRANSDERMAL | Status: DC
  Administered 2014-11-04: 1.5 mg via TRANSDERMAL

## 2014-11-04 MED ORDER — DIBUCAINE 1 % RE OINT: 1.0000 "application " | TOPICAL_OINTMENT | RECTAL | Status: DC | PRN

## 2014-11-04 MED ORDER — PHENYLEPHRINE 40 MCG/ML (10ML) SYRINGE FOR IV PUSH (FOR BLOOD PRESSURE SUPPORT)
PREFILLED_SYRINGE | INTRAVENOUS | Status: AC
  Filled 2014-11-04: qty 10

## 2014-11-04 SURGICAL SUPPLY — 35 items
CLAMP CORD UMBIL (MISCELLANEOUS) IMPLANT
CLOTH BEACON ORANGE TIMEOUT ST (SAFETY) ×2 IMPLANT
CONTAINER PREFILL 10% NBF 15ML (MISCELLANEOUS) IMPLANT
DRAPE SHEET LG 3/4 BI-LAMINATE (DRAPES) IMPLANT
DRSG OPSITE POSTOP 4X10 (GAUZE/BANDAGES/DRESSINGS) ×2 IMPLANT
DRSG VASELINE 3X18 (GAUZE/BANDAGES/DRESSINGS) ×2 IMPLANT
DURAPREP 26ML APPLICATOR (WOUND CARE) ×2 IMPLANT
ELECT REM PT RETURN 9FT ADLT (ELECTROSURGICAL) ×2
ELECTRODE REM PT RTRN 9FT ADLT (ELECTROSURGICAL) ×1 IMPLANT
EXTRACTOR VACUUM KIWI (MISCELLANEOUS) IMPLANT
EXTRACTOR VACUUM M CUP 4 TUBE (SUCTIONS) IMPLANT
GLOVE BIO SURGEON STRL SZ 6.5 (GLOVE) ×2 IMPLANT
GLOVE BIO SURGEON STRL SZ7.5 (GLOVE) ×2 IMPLANT
GLOVE BIOGEL PI IND STRL 6.5 (GLOVE) ×1 IMPLANT
GLOVE BIOGEL PI INDICATOR 6.5 (GLOVE) ×1
GOWN STRL REUS W/TWL LRG LVL3 (GOWN DISPOSABLE) ×4 IMPLANT
GOWN SURGICAL LARGE (GOWNS) ×2 IMPLANT
KIT ABG SYR 3ML LUER SLIP (SYRINGE) IMPLANT
NEEDLE HYPO 25X5/8 SAFETYGLIDE (NEEDLE) IMPLANT
NS IRRIG 1000ML POUR BTL (IV SOLUTION) ×2 IMPLANT
PACK C SECTION WH (CUSTOM PROCEDURE TRAY) ×2 IMPLANT
PAD OB MATERNITY 4.3X12.25 (PERSONAL CARE ITEMS) ×2 IMPLANT
RETRACTOR WND ALEXIS 25 LRG (MISCELLANEOUS) ×1 IMPLANT
RTRCTR C-SECT PINK 25CM LRG (MISCELLANEOUS) IMPLANT
RTRCTR WOUND ALEXIS 25CM LRG (MISCELLANEOUS) ×2
STAPLER VISISTAT 35W (STAPLE) IMPLANT
SUT PLAIN 0 NONE (SUTURE) IMPLANT
SUT VIC AB 0 CT1 36 (SUTURE) ×16 IMPLANT
SUT VIC AB 3-0 CTX 36 (SUTURE) ×2 IMPLANT
SUT VIC AB 3-0 SH 27 (SUTURE)
SUT VIC AB 3-0 SH 27X BRD (SUTURE) IMPLANT
SUT VIC AB 4-0 KS 27 (SUTURE) IMPLANT
SUT VICRYL 0 TIES 12 18 (SUTURE) IMPLANT
TOWEL OR 17X24 6PK STRL BLUE (TOWEL DISPOSABLE) ×2 IMPLANT
TRAY FOLEY CATH SILVER 14FR (SET/KITS/TRAYS/PACK) ×2 IMPLANT

## 2014-11-04 NOTE — Anesthesia Procedure Notes (Signed)
Spinal Patient location during procedure: OR Preanesthetic Checklist Completed: patient identified, site marked, surgical consent, pre-op evaluation, timeout performed, IV checked, risks and benefits discussed and monitors and equipment checked Spinal Block Patient position: sitting Prep: DuraPrep Patient monitoring: heart rate, cardiac monitor, continuous pulse ox and blood pressure Approach: midline Location: L3-4 Injection technique: single-shot Needle Needle type: Sprotte  Needle gauge: 24 G Needle length: 9 cm Needle insertion depth: 7 cm Catheter at skin depth: 14 cm Assessment Sensory level: T4 Additional Notes Spinal Dosage in OR  Bupivicaine ml       1.5 PFMS04   mcg        100 Fentanyl mcg            25

## 2014-11-04 NOTE — MAU Note (Signed)
Patient presents to MAU with contractions that started at 0500 and possibly leaking fluid.  She is scheduled for a repeat c-section on Monday.  Patient also states she has a vaginal rash.  +Fetal movement.  Denies vaginal bleeding.

## 2014-11-04 NOTE — Transfer of Care (Signed)
Immediate Anesthesia Transfer of Care Note  Patient: Cynthia Odonnell  Procedure(s) Performed: Procedure(s): CESAREAN SECTION (N/A)  Patient Location: PACU  Anesthesia Type:Spinal  Level of Consciousness: awake  Airway & Oxygen Therapy: Patient Spontanous Breathing  Post-op Assessment: Report given to RN  Post vital signs: Reviewed and stable  Last Vitals:  Filed Vitals:   11/04/14 0632  BP: 145/87  Pulse: 87  Temp: 36.9 C  Resp: 18    Complications: No apparent anesthesia complications

## 2014-11-04 NOTE — H&P (Signed)
NAME:  Cynthia Odonnell, Cynthia Odonnell                ACCOUNT NO.:  [id number]  MEDICAL RECORD NO.:  [id number]  LOCATION:  [id number]                          [facility]  PHYSICIAN:  Malachi Prohomas F. Ambrose MantleHenley, M.D. DATE OF BIRTH:  June 11, 1986  DATE OF ADMISSION:  11/04/2014 DATE OF DISCHARGE:                             HISTORY & PHYSICAL   HISTORY:  This is a 29 year old white female, para 1-0-2-1, gravida 4, 38 weeks and 6 days gestation, [facility], who is admitted for repeat C-section after having spontaneous rupture of membranes.  Her EDC was confirmed as November 12, 2014, by Dr. Senaida Oresichardson.  Initial ultrasound date was April 12, 2014, 9 weeks 4 days[facility]  Her blood group and type was O positive.  Negative antibody.  RPR negative. Urine culture negative.  Hepatitis B surface antigen negative, HIV negative, GC and Chlamydia negative.  Rubella immune.  Pap smear normal. First trimester screen negative.  One-hour Glucola 102.  Repeat HIV and RPR negative.  Group B strep negative.  The patient began her prenatal course on April 12, 2014.  At that time, she was smoking 1/2 packs per day, but thought she would cut down.  She had a history of marijuana and cocaine use, but was clean since January 2015.  She had a history of bipolar disorder off medicines since November 2014.  Drug dependence was in remission.  After her first prenatal visit, she claimed to have cut down to 5 cigarettes a day.  C-section had been done for fetal heart rate decelerations.  The patient was started on Zoloft and felt much better on Zoloft.  This was increased to 100 mg a day at 20 weeks. Starting her pregnancy at weight of 254, she gradually lost weight.  She had back pain that was treated with Percocet.  She continued to request Percocet even sometimes earlier than her contract.  On October 30, 2014, she was very emotional and hurting, desired more Percocet.  I checked with Dr. Senaida Oresichardson and she allowed a  small number.  She was scheduled for C-section on November 06, 2014, but had a spontaneous rupture of membranes at home  this morning, came to the Maternity Admission Unit, ruptured membranes was confirmed and she was admitted.  PAST MEDICAL HISTORY:  Reveals she has had a history of mania, but she has been off meds until the present pregnancy.  SURGICAL HISTORY:  C-section in 2009, with right ovarian cystectomy. She had therapeutic abortions x2 in 2006 and 2011.  ALLERGIES:  Augmentin gave hives, codeine a rash, and tramadol itching.  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  FAMILY HISTORY:  Sister with seizures.  Maternal grandmother with diabetes, high blood pressure, COPD, cancer of the skin.  Brother with mental disorder died at age 29 from a gunshot wound.  Mother, drug addict.  PHYSICAL EXAMINATION:  GENERAL:  A well-developed, quite obese white female, no distress. VITAL SIGNS:  Blood pressure 145/87, temperature 98.4, pulse 87, respirations 18. HEART:  Normal size and sounds.  No murmurs.  LUNGS:  Clear to auscultation. PELVIC:  Fundal height appropriate with dates.  Fetal heart tones normal.  According to the admitting RN, her cervix is fingertip dilated, presenting part -3, rupture of membranes confirmed.  ADMITTING IMPRESSION:  Intrauterine pregnancy at 38 weeks and 6 days, prior C-section, declined vaginal birth after caesarean.  The patient has been counseled and she wants to proceed with C-section.  She does not want tubal ligation.  Main risk factor is BMI of greater than 47.     Malachi Pro. Ambrose Mantle, M.D.     TFH/MEDQ  D:  11/04/2014  T:  11/04/2014  Job:  [id number]

## 2014-11-04 NOTE — Op Note (Signed)
NAME:  Cynthia Odonnell, Cynthia Odonnell NO.:  [id number]  MEDICAL RECORD NO.:  [id number]  LOCATION:  [facility]                          FAC[facility]:  [facility]  PHYSICIAN:  Malachi Pro. Ambrose Mantle, M.D. DATE OF BIRTH:  23-Apr-1986  DATE OF PROCEDURE:  11/04/2014 DATE OF DISCHARGE:                              OPERATIVE REPORT   PREOPERATIVE DIAGNOSES:  Intrauterine pregnancy, 38 weeks and 6 days, premature rupture of the membranes, prior C-section, declined VBAC.  POSTOPERATIVE DIAGNOSES:  Intrauterine pregnancy, 38 weeks and 6 days, premature rupture of the membranes, prior C-section, declined VBAC. Normal tubes and ovaries and normal uterus.  Adhesions to the left lower uterine segment.  OPERATION:  Low-transverse cervical C-section.  Division of adhesions.  OPERATOR:  Malachi Pro. Ambrose Mantle, M.D.  Threasa HeadsClaiborne Billings.  ANESTHESIA:  Spinal and epidural anesthesia by Dr. Cristela Blue.  DESCRIPTION OF PROCEDURE:  The patient was brought to the operating room and given a spinal and epidural anesthetic by Dr. Jean Rosenthal.  She was placed supine on the table.  The fetal heart tones were normal.  The vulva and vagina were prepped with Betadine solution.  A Foley catheter was inserted through a prepped urethra.  The abdomen was prepped with DuraPrep and was allowed to dry for 3 minutes during which time, a time- out was done.  After 3 minute delay, the abdomen was draped as a sterile field.  The lower abdomen was pinched with Allis clamps and the patient did not feel it.  The old incision was utilized to carry skin incision through the skin, subcutaneous tissue, and down to the fascia.  The fascia was incised transversely on each side.  The fascia was then separated from the rectus muscles superiorly and inferiorly.  The peritoneum was entered, and the incision was enlarged.  There were adhesions of the omentum to the left lower uterine segment, but I was able to make an incision to deliver the baby without  disrupting these adhesions until after the procedure.  I placed an Teacher, early years/pre, made a short incision transversely through the lower uterine segment, superficial layers of the myometrium, went the rest away into the amniotic sac with my finger and a copious amount of meconium-stained fluid was present.  The fluid was not thin, but I did not see any particulate matter.  The vertex was elevated into the incisional opening and then was delivered with fundal pressure.  The head was delivered. The nose and pharynx were suctioned with the bulb.  The rest of the baby was delivered.  The cord was clamped.  The infant was given to the Neonatal team who was in attendance.  The infant was female.  Weight was I think 5 pounds 13 ounces.  Apgars were assigned by the Neonatal team, but the baby cried right on the abdomen.  The placenta was removed intact.  The inside of the uterus was inspected and found to be free of any debris.  The uterine angles were clamped and then the uterine incision was closed with 2 running sutures of 0 Vicryl locking the first layer, nonlocking on the second layer.  A couple of extra figure-of- eight sutures were required for  hemostasis.  The adhesions to the left lower uterine segment just lateral to the lateral aspect of the incision were divided with the Bovie until the anatomy was almost normal.  The Alexis retractor had actually been removed in delivering the baby and was never reinserted.  Diligent search for hemostasis was done as well as liberal irrigation.  No bleeding was present.  The abdominal wall was then closed in layers using interrupted sutures of 0 Vicryl to close the rectus muscle and peritoneum in one layer, two running sutures of 0 Vicryl on the fascia, running 3-0 Vicryl on the subcutaneous tissue, and staples on the skin.  The patient seemed to tolerate the procedure well.  Blood loss was estimated about a 1000 mL.  Sponge and needle counts  were correct, and she was returned to Recovery in satisfactory condition.     Malachi Prohomas F. Ambrose MantleHenley, M.D.     TFH/MEDQ  D:  11/04/2014  T:  11/04/2014  Job:  [id number]

## 2014-11-04 NOTE — Anesthesia Preprocedure Evaluation (Signed)
Anesthesia Evaluation  Patient identified by MRN, date of birth, ID band Patient awake    Reviewed: Allergy & Precautions, H&P , Patient's Chart, lab work & pertinent test results  Airway Mallampati: II  TM Distance: >3 FB Neck ROM: full    Dental no notable dental hx.    Pulmonary asthma , Current Smoker,  breath sounds clear to auscultation  Pulmonary exam normal       Cardiovascular Exercise Tolerance: Good Rhythm:regular Rate:Normal     Neuro/Psych    GI/Hepatic   Endo/Other  Morbid obesity  Renal/GU      Musculoskeletal   Abdominal   Peds  Hematology   Anesthesia Other Findings   Reproductive/Obstetrics                             Anesthesia Physical Anesthesia Plan  ASA: III and emergent  Anesthesia Plan: Spinal   Post-op Pain Management:    Induction:   Airway Management Planned:   Additional Equipment:   Intra-op Plan:   Post-operative Plan:   Informed Consent: I have reviewed the patients History and Physical, chart, labs and discussed the procedure including the risks, benefits and alternatives for the proposed anesthesia with the patient or authorized representative who has indicated his/her understanding and acceptance.   Dental Advisory Given  Plan Discussed with: CRNA  Anesthesia Plan Comments: (Lab work confirmed with CRNA in room. Platelets okay. Discussed spinal anesthetic, and patient consents to the procedure:  included risk of possible headache,backache, failed block, allergic reaction, and nerve injury. This patient was asked if she had any questions or concerns before the procedure started. )        Anesthesia Quick Evaluation

## 2014-11-04 NOTE — MAU Provider Note (Signed)
History     CSN: [id number]  Arrival date and time: 11/04/14 11910616   None     Chief Complaint  Patient presents with  . Contractions  . Rash   Rash This is a new problem. The affected locations include the genitalia. The rash is characterized by redness. Pertinent negatives include no fever or vomiting. Past treatments include anti-itch cream.  Gynecologic Exam The patient's primary symptoms include genital itching, a genital rash and vaginal discharge (watery and yellow). This is a new problem. The current episode started today. The problem occurs intermittently. The problem has been unchanged. The pain is moderate. She is pregnant. Associated symptoms include abdominal pain (with contractions) and rash. Pertinent negatives include no chills, fever, headaches, nausea or vomiting. The vaginal discharge was clear and yellow. The vaginal bleeding is spotting. She has not been passing clots. She has not been passing tissue. Nothing aggravates the symptoms.   This is a 29 y.o. female at 10653w6d who presents with c/o contractions and leaking of fluid. Also complains of a perineal rash. See below for details.   RN Note: Patient presents to MAU with contractions that started at 0500 and possibly leaking fluid. She is scheduled for a repeat c-section on Monday. Patient also states she has a vaginal rash. +Fetal movement. Denies vaginal bleeding OB History    Gravida Para Term Preterm AB TAB SAB Ectopic Multiple Living   4 1 1  2 2    2       Past Medical History  Diagnosis Date  . Optic neuritis     left eye  . Depression   . Termination of pregnancy (fetus)     x 2  . Smoker   . Bronchitis     Hx - uses albuterol inhaler prn  . Anxiety   . GERD (gastroesophageal reflux disease)     zantac prn  . Headache(784.0)     otc prn  . Abnormal Pap smear of cervix     Past Surgical History  Procedure Laterality Date  . Cesarean section  07/31/2008    [facility]  . Wisdom tooth extraction     . Colposcopy      Family History  Problem Relation Age of Onset  . Other Neg Hx     History  Substance Use Topics  . Smoking status: Current Every Day Smoker -- 1.00 packs/day for 16 years    Types: Cigarettes  . Smokeless tobacco: Never Used  . Alcohol Use: No    Allergies:  Allergies  Allergen Reactions  . Amoxicillin Hives  . Codeine Itching  . Penicillins Hives  . Augmentin [Amoxicillin-Pot Clavulanate] Hives and Rash  . Tramadol Itching and Rash    Prescriptions prior to admission  Medication Sig Dispense Refill Last Dose  . acetaminophen (TYLENOL) 500 MG tablet Take 1,000 mg by mouth every 6 (six) hours as needed for mild pain.   11/03/2014 at Unknown time  . albuterol (PROVENTIL HFA;VENTOLIN HFA) 108 (90 BASE) MCG/ACT inhaler Inhale 1-2 puffs into the lungs every 6 (six) hours as needed for wheezing or shortness of breath. 1 Inhaler 0 Past Week at Unknown time  . clonazePAM (KLONOPIN) 0.5 MG tablet Take 0.5 mg by mouth at bedtime.   Past Week at Unknown time  . hydrOXYzine (VISTARIL) 25 MG capsule Take 25 mg by mouth daily as needed for itching.   1 Past Week at Unknown time  . oxyCODONE-acetaminophen (PERCOCET/ROXICET) 5-325 MG per tablet Take by mouth every 4 (  four) hours as needed for severe pain.   11/04/2014 at Unknown time  . Prenatal Vit-Fe Fumarate-FA (PRENATAL VITAMIN PO) Take 1 tablet by mouth daily.    11/03/2014 at Unknown time  . ranitidine (ZANTAC) 75 MG tablet Take 75 mg by mouth as needed for heartburn.   11/04/2014 at Unknown time  . sertraline (ZOLOFT) 100 MG tablet Take 100 mg by mouth at bedtime.   11/03/2014 at Unknown time  . sertraline (ZOLOFT) 50 MG tablet Take 50 mg by mouth at bedtime.   11/03/2014 at Unknown time  . promethazine (PHENERGAN) 25 MG tablet Take 1 tablet (25 mg total) by mouth every 6 (six) hours as needed for nausea or vomiting. 10 tablet 0 Taking    Review of Systems  Constitutional: Negative for fever, chills and malaise/fatigue.   Gastrointestinal: Positive for abdominal pain (with contractions). Negative for nausea and vomiting.  Genitourinary: Positive for vaginal discharge (watery and yellow).       Leaking watery yellow fluid   Skin: Positive for rash.  Neurological: Negative for focal weakness and headaches.   Physical Exam   Blood pressure 145/87, pulse 87, temperature 98.4 F (36.9 C), temperature source Oral, resp. rate 18, last menstrual period 02/05/2014.  Physical Exam  Constitutional: She is oriented to person, place, and time. She appears well-developed and well-nourished. No distress.  HENT:  Head: Normocephalic.  Cardiovascular: Normal rate.   Respiratory: Effort normal.  GI: Soft. There is no tenderness. There is no rebound and no guarding.  Genitourinary: Vaginal discharge (blood tinged watery fluid, + pooling, + ferning) found.  Musculoskeletal: Normal range of motion.  Neurological: She is alert and oriented to person, place, and time.  Skin: Skin is warm and dry.  Psychiatric: She has a normal mood and affect.   Dilation: 1 Effacement (%): 30 Presentation: Vertex Exam by:: Wynelle Bourgeois, CNM  FHR reactive Irregular mild contractions  MAU Course  Procedures  MDM Results for orders placed or performed during the hospital encounter of 11/04/14 (from the past 24 hour(s))  Fern Test     Status: Abnormal   Collection Time: 11/04/14  7:01 AM  Result Value Ref Range   POCT Fern Test    Positive   Assessment and Plan  A:  SIUP at [redacted]w[redacted]d       PROM at term      Prior C/S, planned repeat C/S, last had tea one hour ago, otherwise NPO since midnight  P:  Admit per Dr Ambrose Mantle      RN will contact Dr Ace Gins 11/04/2014, 6:58 AM

## 2014-11-04 NOTE — Anesthesia Postprocedure Evaluation (Signed)
  Anesthesia Post-op Note  Patient: Cynthia Odonnell  Procedure(s) Performed: Procedure(s): CESAREAN SECTION (N/A)  Patient is awake, responsive, moving her legs, and has signs of resolution of her numbness. Pain and nausea are reasonably well controlled. Vital signs are stable and clinically acceptable. Oxygen saturation is clinically acceptable. There are no apparent anesthetic complications at this time. Patient is ready for discharge.

## 2014-11-05 LAB — CBC
HCT: 32.3 % — ABNORMAL LOW (ref 36.0–46.0)
Hemoglobin: 11.3 g/dL — ABNORMAL LOW (ref 12.0–15.0)
MCH: 31.3 pg (ref 26.0–34.0)
MCHC: 35 g/dL (ref 30.0–36.0)
MCV: 89.5 fL (ref 78.0–100.0)
PLATELETS: 124 10*3/uL — AB (ref 150–400)
RBC: 3.61 MIL/uL — ABNORMAL LOW (ref 3.87–5.11)
RDW: 14.5 % (ref 11.5–15.5)
WBC: 8.1 10*3/uL (ref 4.0–10.5)

## 2014-11-05 MED ORDER — OXYCODONE-ACETAMINOPHEN 5-325 MG PO TABS
1.0000 | ORAL_TABLET | Freq: Once | ORAL | Status: AC
  Administered 2014-11-05: 1 via ORAL

## 2014-11-05 NOTE — Progress Notes (Signed)
Clinical Social Work Department PSYCHOSOCIAL ASSESSMENT - MATERNAL/CHILD 11/05/2014  Patient:  Cynthia Odonnell, Cynthia Odonnell  Account Number:  [id number]  Admit Date:  11/04/2014  Cynthia Odonnell    Clinical Social Worker:  Cynthia Glymph, LCSW   Date/Time:  11/05/2014 10:30 AM  Date Referred:  11/04/2014   Referral source  Central Nursery     Referred reason  Substance Abuse   Other referral source:    I:  FAMILY / [place] legal guardian:  PARENT  Guardian - Name Guardian - Age Guardian - Address  [facility] [address], [place] [address]  LOWE, DARRYL  SAME AS ABOVE   Other household support members/support persons Other support:    II  PSYCHOSOCIAL DATA Information Source:    Insurance risk surveyor Resources Employment:   [employer] is employed   Museum/gallery curator resources:  [employer] [place]:   Other  [place] / Grade:   Maternity Care Coordinator / Child Services Coordination / Early Interventions:  Cultural issues impacting care:    III  STRENGTHS Strengths  Supportive family/friends  Home prepared for Child (including basic supplies)  Adequate Resources   Strength comment:    IV  RISK FACTORS AND CURRENT PROBLEMS Current Problem:     Risk Factor & Current Problem Patient Issue Family Issue Risk Factor / Current Problem Comment  Mental Illness Y N Hx of depression  Substance Abuse Y N Newborn positive for marijuana    V  SOCIAL WORK ASSESSMENT Acknowledged order for social work consult to address concerns regarding hx of substance abuse.  Newborn was positive for marijuana.   Met with mother who was pleasant and receptive to CSW.    Parents are married and have one other dependent age 58.   MOB admits to use of marijuana 2-3 times a month.  She also reports hx of cocaine abuse but states that she has not used any cocaine in 18 months. She reports PP Depression with 29 year old.  "Things started to get  out of control and I started using again".  MOB states that Nov. 2014 she participated in a 2 week SA residential treatment program in [place].  Informed that she had been in rehab treatment programs prior to the birth of her 66 year old son.    Informed that she was diagnosed with bipolar at age 40 and about a year ago, she was evaluated at [facility] and the bipolar diagnoses was changed to depression.  She is currently taking Zoloft, and notes that she has been stable on this medication.  MOB is aware of newborn's positive UDS for marijuana.  Informed her of referral that will be made to DSS and what to expect.  She denies any prior hx with DSS.   Mother informed of social work Fish farm manager.      VI SOCIAL WORK PLAN Social Work Plan  Child Scientist, forensic Report   Type of pt/family education:   Hospital's drug testing policy  PP Depression information and resources  DSS referral information   If child protective services report - county:   If child protective services report - date:   Information/referral to community resources comment:   Other social work plan:   Will continue to monitor drug screen  CSW to follow up with DSS on Monday

## 2014-11-05 NOTE — Progress Notes (Signed)
Patient ID: Cynthia Odonnell, female   DOB: 03/23/1986, 29 y.o.   MRN: [id number] #1 afebrile BP normal output good. HGB 13.2 to 11.3.Ambulating tolerating a diet Voiding well. No flatus

## 2014-11-06 ENCOUNTER — Encounter (HOSPITAL_COMMUNITY): Payer: Self-pay | Admitting: Obstetrics and Gynecology

## 2014-11-06 ENCOUNTER — Inpatient Hospital Stay (HOSPITAL_COMMUNITY)
Admission: RE | Admit: 2014-11-06 | Payer: Medicaid Other | Source: Ambulatory Visit | Admitting: Obstetrics and Gynecology

## 2014-11-06 ENCOUNTER — Encounter (HOSPITAL_COMMUNITY): Admission: RE | Payer: Self-pay | Source: Ambulatory Visit

## 2014-11-06 HISTORY — DX: Encounter for elective termination of pregnancy: Z33.2

## 2014-11-06 SURGERY — Surgical Case
Anesthesia: Regional

## 2014-11-06 MED ORDER — IBUPROFEN 600 MG PO TABS: 600.0000 mg | ORAL_TABLET | Freq: Four times a day (QID) | ORAL | Status: DC | PRN

## 2014-11-06 MED ORDER — OXYCODONE-ACETAMINOPHEN 5-325 MG PO TABS: 1.0000 | ORAL_TABLET | Freq: Four times a day (QID) | ORAL | Status: DC | PRN

## 2014-11-06 NOTE — Progress Notes (Signed)
CSW contacted [place] CPS Intake in order to determine status of the CPS report.   Report has been classified as a "72 hour response.  Cynthia Odonnell ([phone]) is the assigned worker and will respond within 72 hours.    CPS to follow up in the community.  No barriers to discharge.  

## 2014-11-06 NOTE — Discharge Instructions (Signed)
booklet °

## 2014-11-06 NOTE — Discharge Summary (Signed)
NAME:  [name], Cynthia                ACCOUNT NO.:  [id number]  MEDICAL RECORD NO.:  [id number]  LOCATION:  9102                          [facility]ILITY:  [facility]  PHYSICIAN:  Malachi Prohomas F. Ambrose MantleHenley, M.D. DATE OF BIRTH:  03-19-1986  DATE OF ADMISSION:  11/04/2014 DATE OF DISCHARGE:  11/06/2014                              DISCHARGE SUMMARY   This is a 29 year old white female, para 1-0-2-1, gravida 4, 38 weeks and 6 days gestation who was scheduled for C-section on November 06, 2014, admitted for repeat C-section after having spontaneous rupture of membranes.  The patient underwent a low-transverse cervical C-section by Dr. Ambrose MantleHenley with Dr. Claiborne Billingsallahan assisting under spinal and epidural anesthesia by Dr. Cristela BlueKyle Jackson.  Baby weighs 5 pounds 13 ounce female with fairly thick meconium stain.  The patient and baby did well postpartum.  The patient resumed normal bowel function.  Tolerated a regular diet, ambulated well, and was voiding well and was ready for discharge on the second postpartum day.  Initial hemoglobin 13.2, hematocrit 37.3, white count 10,200, platelet count 146,000, followup hemoglobin was 11.3.  FINAL DIAGNOSES: 1. Intrauterine pregnancy at 38 weeks and 6 days delivered by repeat C-     section.  Prior C-section, declined vaginal birth after cesarean     (section).  Spontaneous rupture of membranes. 2. History of drug dependence.  OPERATION:  Low-transverse cervical C-section.  Division of adhesions on the left lower uterine segment.  FINAL CONDITION:  Improved.  INSTRUCTIONS:  Our regular discharge instruction booklet, our after visit summary, prescriptions for Motrin 600 mg 30 tablets 1 every 6 hours as needed for pain and Percocet 5/325, 30 tablets 1 every 6 hours as needed for pain.  She is to return to the office in 2 weeks for followup examination.  She is advised to keep her skin incision covered for 1 week.  She is to resume her Zoloft and take her Klonopin and Vistaril  as needed.  Resume her prenatal vitamins.     Malachi Prohomas F. Ambrose MantleHenley, M.D.     TFH/MEDQ  D:  11/06/2014  T:  11/06/2014  Job:  [id number]

## 2014-11-06 NOTE — Progress Notes (Signed)
UR chart review completed.  

## 2014-11-06 NOTE — Progress Notes (Signed)
Patient ID: Cynthia Odonnell, female   DOB: 11/07/1985, 29 y.o.   MRN: [id number] #2 afebrile BP normal Tolerating a diet, passing flatus, ambulating well , voiding well and is ready for d/c.

## 2014-11-17 ENCOUNTER — Inpatient Hospital Stay (HOSPITAL_COMMUNITY)
Admission: AD | Admit: 2014-11-17 | Discharge: 2014-11-17 | Disposition: A | Discharge status: Home / Self Care | Payer: Medicaid Other | Source: Ambulatory Visit | Attending: Obstetrics and Gynecology | Admitting: Obstetrics and Gynecology

## 2014-11-17 ENCOUNTER — Encounter (HOSPITAL_COMMUNITY): Payer: Self-pay | Admitting: *Deleted

## 2014-11-17 DIAGNOSIS — I158 Other secondary hypertension: Secondary | ICD-10-CM | POA: Diagnosis not present

## 2014-11-17 DIAGNOSIS — R030 Elevated blood-pressure reading, without diagnosis of hypertension: Secondary | ICD-10-CM | POA: Diagnosis present

## 2014-11-17 DIAGNOSIS — O169 Unspecified maternal hypertension, unspecified trimester: Secondary | ICD-10-CM

## 2014-11-17 DIAGNOSIS — O165 Unspecified maternal hypertension, complicating the puerperium: Secondary | ICD-10-CM

## 2014-11-17 DIAGNOSIS — O9089 Other complications of the puerperium, not elsewhere classified: Secondary | ICD-10-CM | POA: Insufficient documentation

## 2014-11-17 DIAGNOSIS — R0989 Other specified symptoms and signs involving the circulatory and respiratory systems: Secondary | ICD-10-CM

## 2014-11-17 LAB — URINALYSIS, ROUTINE W REFLEX MICROSCOPIC
BILIRUBIN URINE: NEGATIVE
Glucose, UA: NEGATIVE mg/dL
Ketones, ur: NEGATIVE mg/dL
LEUKOCYTES UA: NEGATIVE
NITRITE: NEGATIVE
PH: 6 (ref 5.0–8.0)
Protein, ur: NEGATIVE mg/dL
Specific Gravity, Urine: 1.02 (ref 1.005–1.030)
Urobilinogen, UA: 0.2 mg/dL (ref 0.0–1.0)

## 2014-11-17 LAB — COMPREHENSIVE METABOLIC PANEL
ALT: 24 U/L (ref 0–35)
AST: 19 U/L (ref 0–37)
Albumin: 3.8 g/dL (ref 3.5–5.2)
Alkaline Phosphatase: 111 U/L (ref 39–117)
Anion gap: 9 (ref 5–15)
BILIRUBIN TOTAL: 0.5 mg/dL (ref 0.3–1.2)
BUN: 14 mg/dL (ref 6–23)
CO2: 22 mmol/L (ref 19–32)
CREATININE: 0.95 mg/dL (ref 0.50–1.10)
Calcium: 8.7 mg/dL (ref 8.4–10.5)
Chloride: 105 mmol/L (ref 96–112)
GLUCOSE: 96 mg/dL (ref 70–99)
POTASSIUM: 3.9 mmol/L (ref 3.5–5.1)
SODIUM: 136 mmol/L (ref 135–145)
Total Protein: 7.1 g/dL (ref 6.0–8.3)

## 2014-11-17 LAB — CBC
HCT: 41.1 % (ref 36.0–46.0)
HEMOGLOBIN: 14.5 g/dL (ref 12.0–15.0)
MCH: 30.5 pg (ref 26.0–34.0)
MCHC: 35.3 g/dL (ref 30.0–36.0)
MCV: 86.3 fL (ref 78.0–100.0)
PLATELETS: 310 10*3/uL (ref 150–400)
RBC: 4.76 MIL/uL (ref 3.87–5.11)
RDW: 12.9 % (ref 11.5–15.5)
WBC: 10.1 10*3/uL (ref 4.0–10.5)

## 2014-11-17 LAB — PROTEIN / CREATININE RATIO, URINE
Creatinine, Urine: 161 mg/dL
PROTEIN CREATININE RATIO: 0.07 (ref 0.00–0.15)
Total Protein, Urine: 12 mg/dL

## 2014-11-17 LAB — URINE MICROSCOPIC-ADD ON

## 2014-11-17 NOTE — MAU Provider Note (Signed)
History     CSN: [id number]  Arrival date and time: 11/17/14 1228   First Provider Initiated Contact with Patient 11/17/14 1255      Chief Complaint  Patient presents with  . Hypertension   HPI THis is a 29 y.o. female who is about 2 weeks post vaginal delivery who presents from office for evaluation of hypertension measured there today. Denies current headache but had one earlier. Had upper abdominal pain yesterday but not now. Denies visual changes. No unusual swelling.   RN Note:   Sent from office. PP With elevated BP and headache. For pre-eclampsia eval          OB History    Gravida Para Term Preterm AB TAB SAB Ectopic Multiple Living   4 2 2  2 2    0 1      Past Medical History  Diagnosis Date  . Optic neuritis     left eye  . Depression   . Termination of pregnancy (fetus)     x 2  . Smoker   . Bronchitis     Hx - uses albuterol inhaler prn  . Anxiety   . GERD (gastroesophageal reflux disease)     zantac prn  . Headache(784.0)     otc prn  . Abnormal Pap smear of cervix     Past Surgical History  Procedure Laterality Date  . Cesarean section  07/31/2008    [facility]  . Wisdom tooth extraction    . Colposcopy    . Cesarean section N/A 11/04/2014    Procedure: CESAREAN SECTION;  Surgeon: Tracey Harrieshomas Henley, MD;  Location: [facility];  Service: Obstetrics;  Laterality: N/A;    Family History  Problem Relation Age of Onset  . Other Neg Hx     History  Substance Use Topics  . Smoking status: Current Every Day Smoker -- 1.00 packs/day for 16 years    Types: Cigarettes  . Smokeless tobacco: Never Used  . Alcohol Use: No    Allergies:  Allergies  Allergen Reactions  . Amoxicillin Hives  . Codeine Itching  . Penicillins Hives  . Augmentin [Amoxicillin-Pot Clavulanate] Hives and Rash  . Tramadol Itching and Rash    Prescriptions prior to admission  Medication Sig Dispense Refill Last Dose  . albuterol (PROVENTIL HFA;VENTOLIN HFA) 108 (90 BASE)  MCG/ACT inhaler Inhale 1-2 puffs into the lungs every 6 (six) hours as needed for wheezing or shortness of breath. 1 Inhaler 0 Past Week at Unknown time  . clonazePAM (KLONOPIN) 0.5 MG tablet Take 0.5 mg by mouth at bedtime as needed for anxiety.    Past Week at Unknown time  . hydrOXYzine (VISTARIL) 25 MG capsule Take 25 mg by mouth at bedtime as needed for anxiety or itching.   1 Past Week at Unknown time  . ibuprofen (ADVIL,MOTRIN) 600 MG tablet Take 1 tablet (600 mg total) by mouth every 6 (six) hours as needed. 30 tablet 0   . oxyCODONE-acetaminophen (PERCOCET/ROXICET) 5-325 MG per tablet Take 1 tablet by mouth every 6 (six) hours as needed (for pain scale 4-7). 30 tablet 0   . Prenatal Vit-Fe Fumarate-FA (PRENATAL VITAMIN PO) Take 1 tablet by mouth daily.    11/03/2014 at Unknown time  . ranitidine (ZANTAC) 75 MG tablet Take 150 mg by mouth daily as needed for heartburn.    11/04/2014 at Unknown time  . sertraline (ZOLOFT) 100 MG tablet Take 100 mg by mouth at bedtime. Takes with 50 mg to equal  150 mg total   11/03/2014 at Unknown time  . sertraline (ZOLOFT) 50 MG tablet Take 50 mg by mouth at bedtime. Takes with 100 mg to equal 150 mg total   11/03/2014 at Unknown time    Review of Systems  Constitutional: Negative for fever, chills and malaise/fatigue.  Eyes: Negative for blurred vision and double vision.  Gastrointestinal: Negative for nausea, vomiting, abdominal pain, diarrhea and constipation.  Genitourinary: Negative for dysuria.  Neurological: Positive for headaches. Negative for dizziness, focal weakness, seizures and loss of consciousness.  Psychiatric/Behavioral: Negative for depression.   Physical Exam   Blood pressure 144/87, pulse 77, temperature 98.2 F (36.8 C), temperature source Oral, resp. rate 20, last menstrual period 02/05/2014, not currently breastfeeding.  Filed Vitals:   11/17/14 1330 11/17/14 1345 11/17/14 1400 11/17/14 1415  BP: 116/85 120/74 117/77 118/69  Pulse:  117 90 66 64  Temp:      TempSrc:      Resp:        Physical Exam  Constitutional: She is oriented to person, place, and time. She appears well-developed and well-nourished.  HENT:  Head: Normocephalic.  Cardiovascular: Normal rate, regular rhythm and normal heart sounds.  Exam reveals no gallop and no friction rub.   No murmur heard. Respiratory: Effort normal and breath sounds normal.  GI: Soft. She exhibits no distension and no mass. There is no tenderness. There is no rebound and no guarding.  Genitourinary:  Pelvic exam not indicated   Musculoskeletal: Normal range of motion. She exhibits no edema or tenderness.  Neurological: She is alert and oriented to person, place, and time. She has normal reflexes. She displays normal reflexes (brisk at 3+). She exhibits normal muscle tone.  Skin: Skin is warm and dry.  Psychiatric: She has a normal mood and affect.    MAU Course  Procedures  MDM Results for orders placed or performed during the hospital encounter of 11/17/14 (from the past 24 hour(s))  Urinalysis, Routine w reflex microscopic     Status: Abnormal   Collection Time: 11/17/14 12:40 PM  Result Value Ref Range   Color, Urine YELLOW YELLOW   APPearance CLEAR CLEAR   Specific Gravity, Urine 1.020 1.005 - 1.030   pH 6.0 5.0 - 8.0   Glucose, UA NEGATIVE NEGATIVE mg/dL   Hgb urine dipstick SMALL (A) NEGATIVE   Bilirubin Urine NEGATIVE NEGATIVE   Ketones, ur NEGATIVE NEGATIVE mg/dL   Protein, ur NEGATIVE NEGATIVE mg/dL   Urobilinogen, UA 0.2 0.0 - 1.0 mg/dL   Nitrite NEGATIVE NEGATIVE   Leukocytes, UA NEGATIVE NEGATIVE  Protein / creatinine ratio, urine     Status: None   Collection Time: 11/17/14 12:40 PM  Result Value Ref Range   Creatinine, Urine 161.00 mg/dL   Total Protein, Urine 12 mg/dL   Protein Creatinine Ratio 0.07 0.00 - 0.15  Urine microscopic-add on     Status: Abnormal   Collection Time: 11/17/14 12:40 PM  Result Value Ref Range   Squamous  Epithelial / LPF FEW (A) RARE   WBC, UA 0-2 <3 WBC/hpf  CBC     Status: None   Collection Time: 11/17/14 12:50 PM  Result Value Ref Range   WBC 10.1 4.0 - 10.5 K/uL   RBC 4.76 3.87 - 5.11 MIL/uL   Hemoglobin 14.5 12.0 - 15.0 g/dL   HCT 40.9 81.1 - 91.4 %   MCV 86.3 78.0 - 100.0 fL   MCH 30.5 26.0 - 34.0 pg   MCHC 35.3  30.0 - 36.0 g/dL   RDW 16.1 09.6 - 04.5 %   Platelets 310 150 - 400 K/uL  Comprehensive metabolic panel     Status: None   Collection Time: 11/17/14 12:50 PM  Result Value Ref Range   Sodium 136 135 - 145 mmol/L   Potassium 3.9 3.5 - 5.1 mmol/L   Chloride 105 96 - 112 mmol/L   CO2 22 19 - 32 mmol/L   Glucose, Bld 96 70 - 99 mg/dL   BUN 14 6 - 23 mg/dL   Creatinine, Ser 4.09 0.50 - 1.10 mg/dL   Calcium 8.7 8.4 - 81.1 mg/dL   Total Protein 7.1 6.0 - 8.3 g/dL   Albumin 3.8 3.5 - 5.2 g/dL   AST 19 0 - 37 U/L   ALT 24 0 - 35 U/L   Alkaline Phosphatase 111 39 - 117 U/L   Total Bilirubin 0.5 0.3 - 1.2 mg/dL   Anion gap 9.0 5 - 15   Dr Jackelyn Knife notified of results and BP readings. Will discharge home.  Assessment and Plan  A:  2 weeks postpartum      Postpartum hypertension without evidence of preeclampsia  P:  Per Dr Jackelyn Knife, patient instructed to go to Office in one week for BP check       Preeclampsia precautions         [facility] 11/17/2014, 12:55 PM

## 2014-11-17 NOTE — MAU Note (Signed)
Sent from office.  PP  With elevated BP and headache. For pre-eclampsia eval

## 2014-11-17 NOTE — Discharge Instructions (Signed)

## 2014-11-24 ENCOUNTER — Encounter (HOSPITAL_COMMUNITY): Payer: Self-pay | Admitting: *Deleted

## 2014-12-04 IMAGING — CR DG CHEST 2V
2 series · 2 of 2 positions shown · non-contrast
Comparison: 02/24/2014

CLINICAL DATA: Shortness of breath, asthma

EXAM:
CHEST  2 VIEW

[w chest pa]
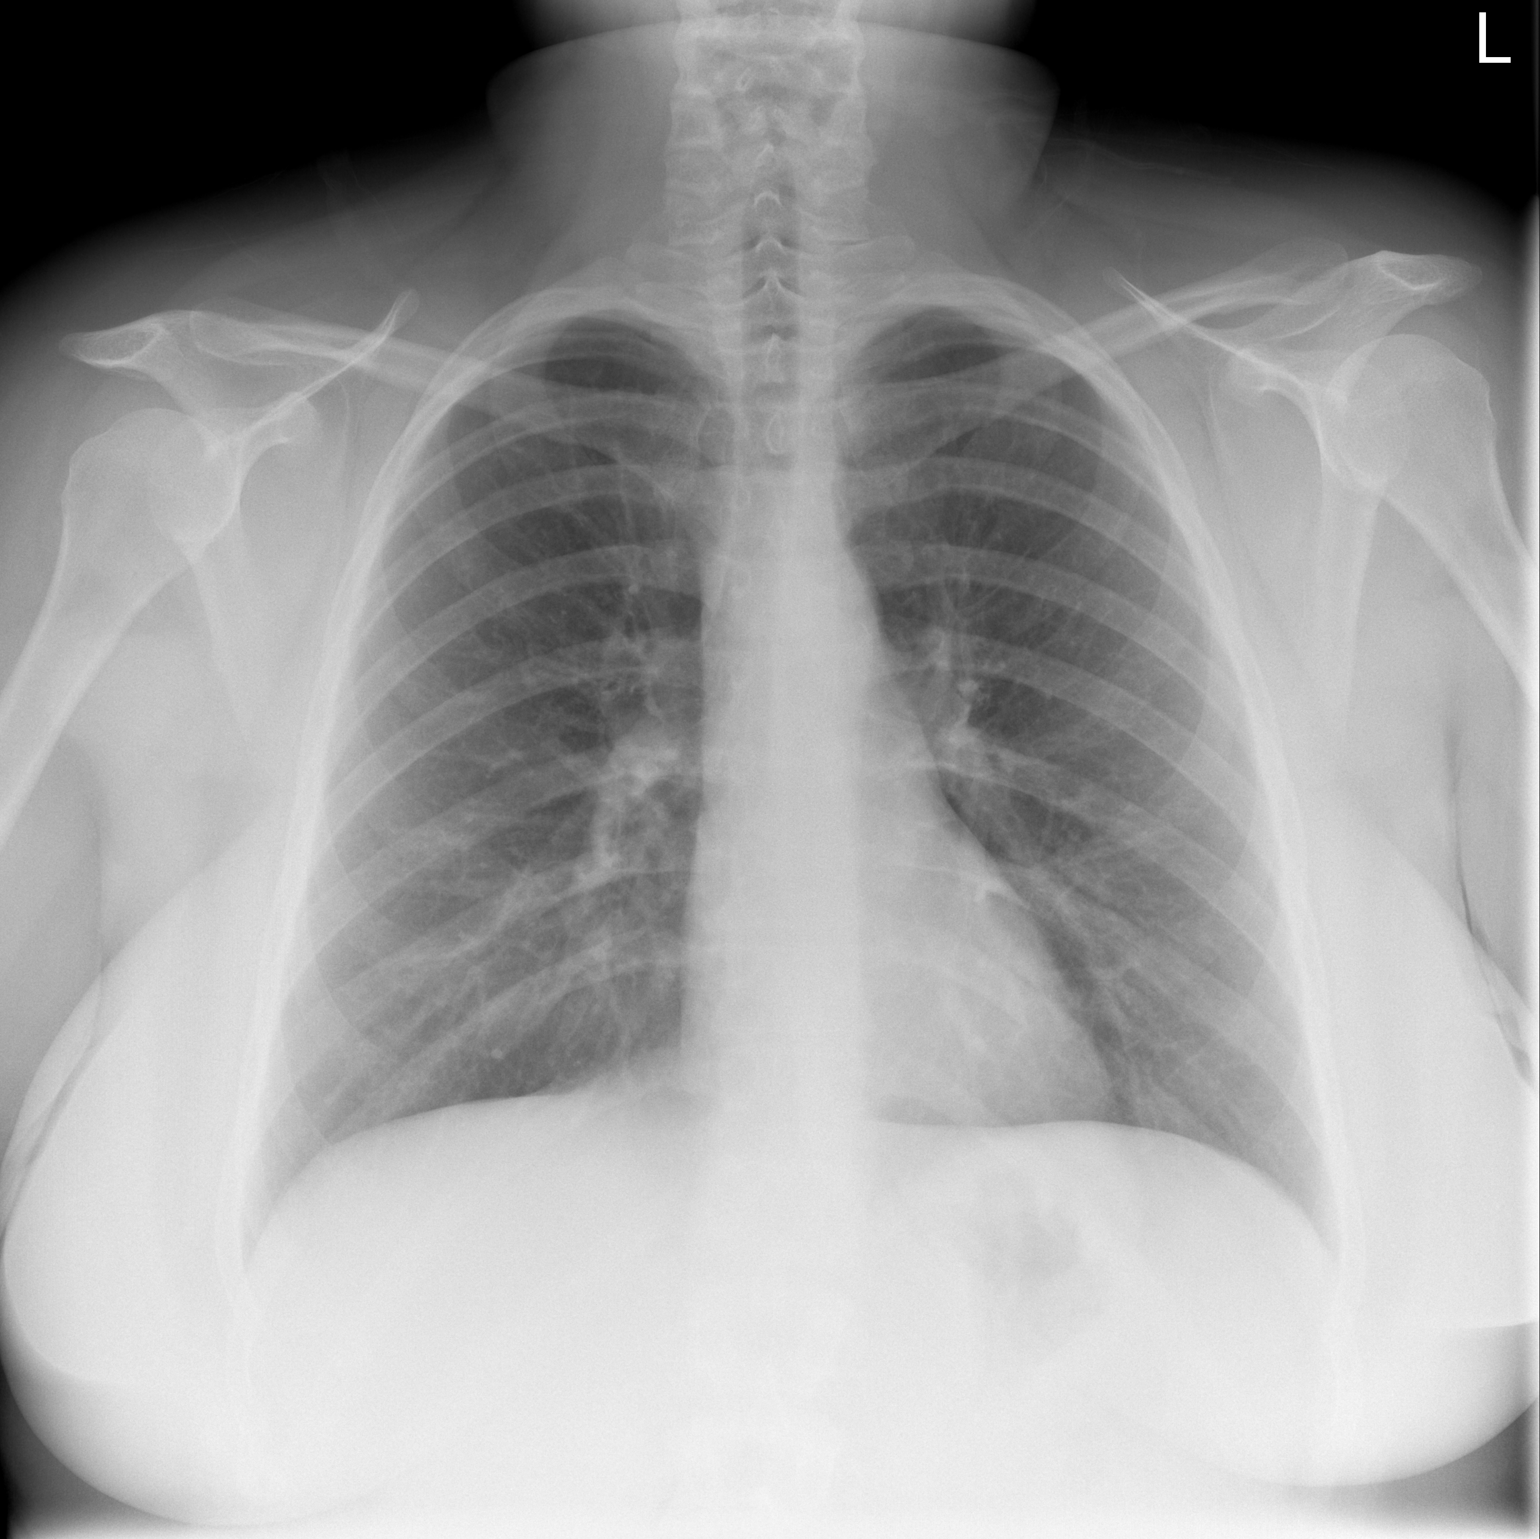

[w chest lat]
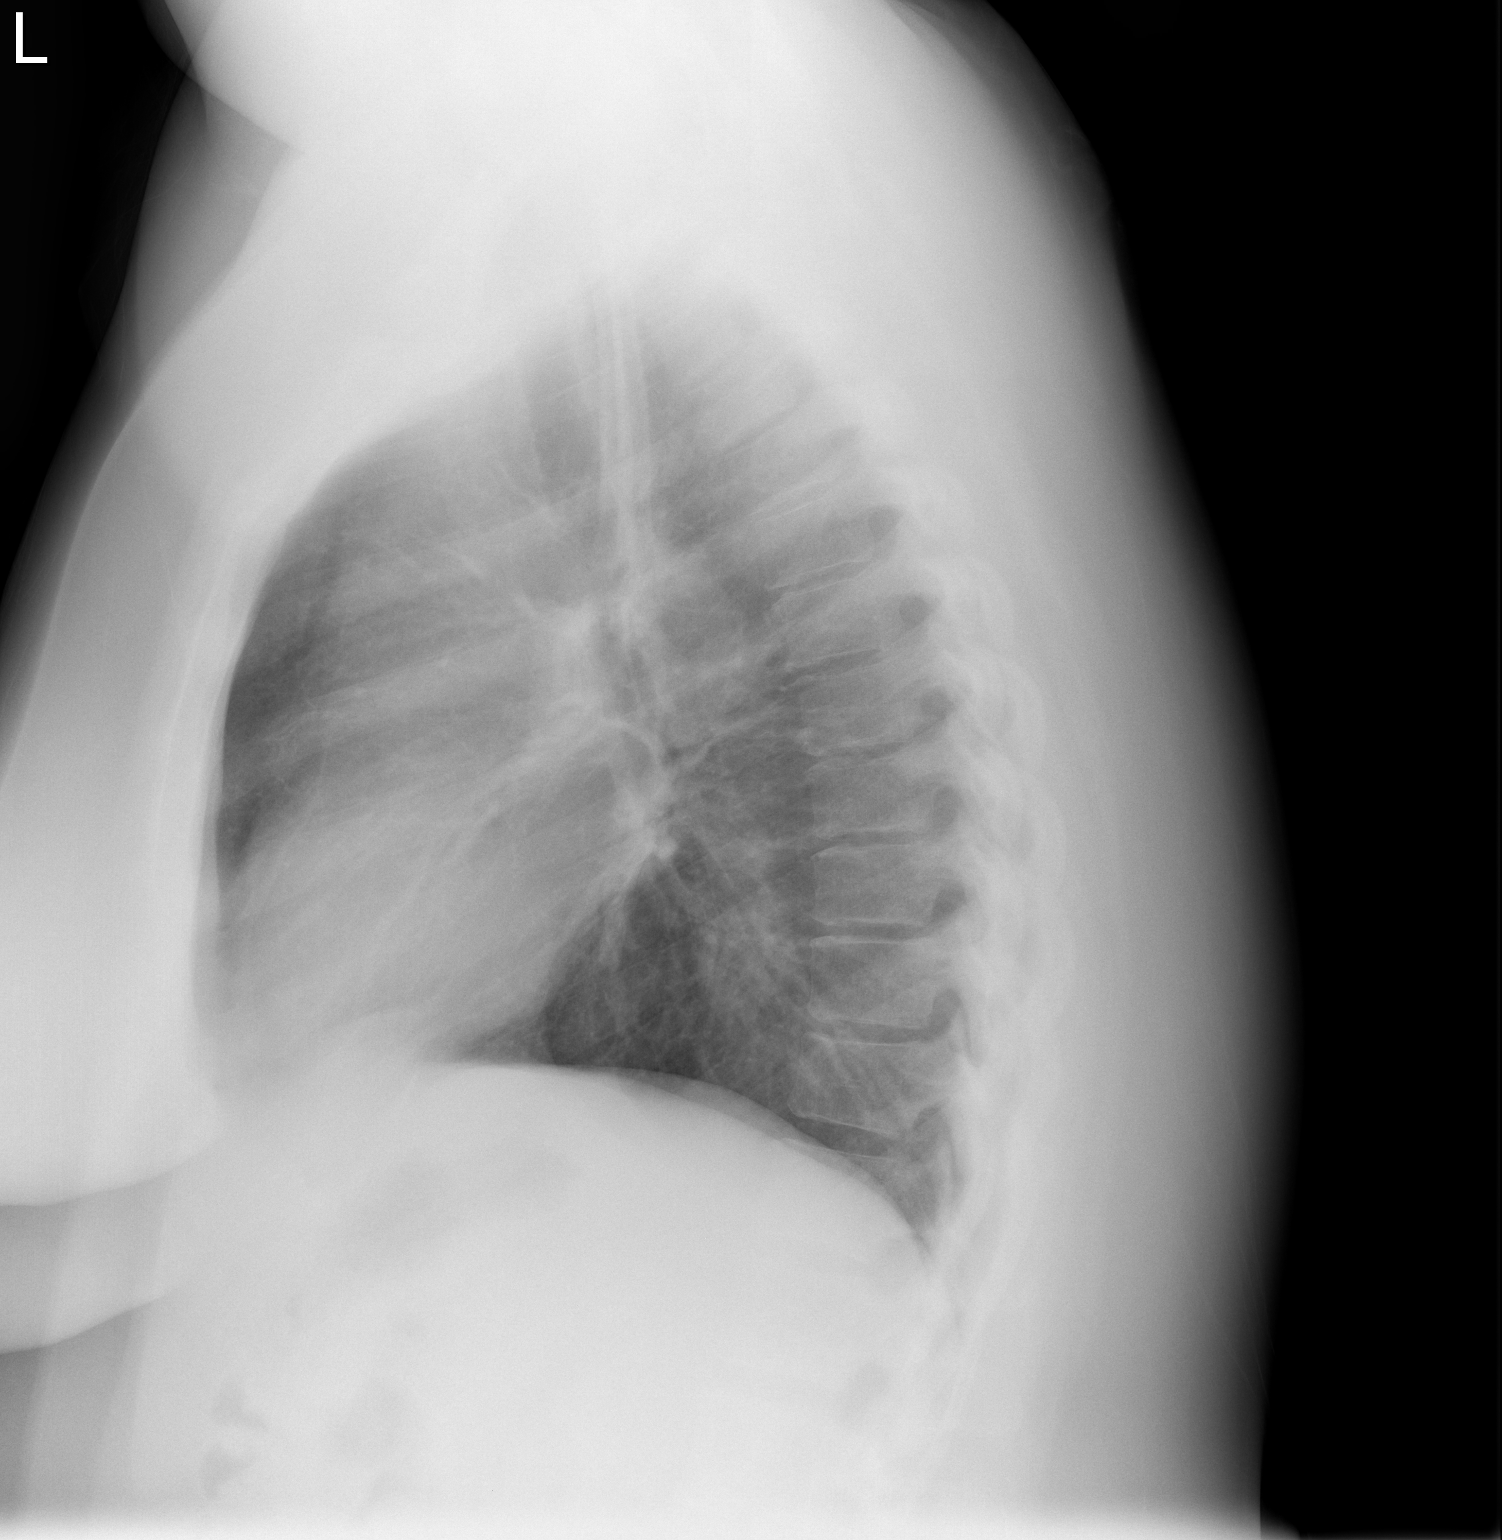

[2 of 2 positions shown; findings below may reference images not displayed]

FINDINGS: The heart size and mediastinal contours are within normal limits.
Both lungs are clear. The visualized skeletal structures are
unremarkable.
IMPRESSION: No active cardiopulmonary disease.

## 2015-01-24 ENCOUNTER — Other Ambulatory Visit: Payer: Self-pay | Admitting: Family

## 2015-01-24 ENCOUNTER — Ambulatory Visit
Admission: RE | Admit: 2015-01-24 | Discharge: 2015-01-24 | Disposition: A | Discharge status: Home / Self Care | Payer: Medicaid Other | Source: Ambulatory Visit | Attending: Family | Admitting: Family

## 2015-01-24 DIAGNOSIS — M545 Low back pain: Secondary | ICD-10-CM

## 2015-01-30 ENCOUNTER — Other Ambulatory Visit: Payer: Self-pay | Admitting: Family

## 2015-01-30 DIAGNOSIS — M5431 Sciatica, right side: Secondary | ICD-10-CM

## 2015-02-06 ENCOUNTER — Encounter (HOSPITAL_BASED_OUTPATIENT_CLINIC_OR_DEPARTMENT_OTHER): Payer: Self-pay

## 2015-02-06 ENCOUNTER — Emergency Department (HOSPITAL_BASED_OUTPATIENT_CLINIC_OR_DEPARTMENT_OTHER)
Admission: EM | Admit: 2015-02-06 | Discharge: 2015-02-06 | Disposition: A | Discharge status: Home / Self Care | Payer: Medicaid Other | Attending: Emergency Medicine | Admitting: Emergency Medicine

## 2015-02-06 DIAGNOSIS — Z720 Tobacco use: Secondary | ICD-10-CM | POA: Insufficient documentation

## 2015-02-06 DIAGNOSIS — M546 Pain in thoracic spine: Secondary | ICD-10-CM | POA: Insufficient documentation

## 2015-02-06 DIAGNOSIS — Z8709 Personal history of other diseases of the respiratory system: Secondary | ICD-10-CM | POA: Diagnosis not present

## 2015-02-06 DIAGNOSIS — Z8669 Personal history of other diseases of the nervous system and sense organs: Secondary | ICD-10-CM | POA: Insufficient documentation

## 2015-02-06 DIAGNOSIS — Z3202 Encounter for pregnancy test, result negative: Secondary | ICD-10-CM | POA: Insufficient documentation

## 2015-02-06 DIAGNOSIS — K219 Gastro-esophageal reflux disease without esophagitis: Secondary | ICD-10-CM | POA: Insufficient documentation

## 2015-02-06 DIAGNOSIS — F419 Anxiety disorder, unspecified: Secondary | ICD-10-CM | POA: Insufficient documentation

## 2015-02-06 DIAGNOSIS — Z880 Allergy status to penicillin: Secondary | ICD-10-CM | POA: Diagnosis not present

## 2015-02-06 DIAGNOSIS — M545 Low back pain: Secondary | ICD-10-CM

## 2015-02-06 DIAGNOSIS — F329 Major depressive disorder, single episode, unspecified: Secondary | ICD-10-CM | POA: Diagnosis not present

## 2015-02-06 DIAGNOSIS — G8929 Other chronic pain: Secondary | ICD-10-CM | POA: Insufficient documentation

## 2015-02-06 LAB — URINALYSIS, ROUTINE W REFLEX MICROSCOPIC
Bilirubin Urine: NEGATIVE
Glucose, UA: NEGATIVE mg/dL
HGB URINE DIPSTICK: NEGATIVE
Ketones, ur: NEGATIVE mg/dL
Leukocytes, UA: NEGATIVE
Nitrite: NEGATIVE
PROTEIN: NEGATIVE mg/dL
Specific Gravity, Urine: 1.017 (ref 1.005–1.030)
UROBILINOGEN UA: 0.2 mg/dL (ref 0.0–1.0)
pH: 6 (ref 5.0–8.0)

## 2015-02-06 LAB — PREGNANCY, URINE: PREG TEST UR: NEGATIVE

## 2015-02-06 MED ORDER — KETOROLAC TROMETHAMINE 60 MG/2ML IM SOLN
30.0000 mg | Freq: Once | INTRAMUSCULAR | Status: AC
  Administered 2015-02-06: 30 mg via INTRAMUSCULAR
  Filled 2015-02-06: qty 2

## 2015-02-06 NOTE — ED Notes (Signed)
MD at bedside. 

## 2015-02-06 NOTE — ED Provider Notes (Signed)
CSN: [id number]     Arrival date & time 02/06/15  1747 History   This chart was scribed for Cynthia Mo, MD by Murriel Hopper, ED Scribe. This patient was seen in room MH01/MH01 and the patient's care was started at 6:11 PM.  Chief Complaint  Patient presents with  . Back Pain     Patient is a 29 y.o. female presenting with back pain. The history is provided by the patient. No language interpreter was used.  Back Pain Location:  Lumbar spine Quality:  Unable to specify Radiates to:  L foot and R foot Pain severity:  Severe Pain is:  Same all the time Onset quality:  Gradual Duration:  12 months Timing:  Constant Progression:  Worsening Chronicity:  Recurrent Context comment:  Began after pregnancy Relieved by:  Nothing Worsened by:  Movement, palpation, stress, touching and bending Associated symptoms: numbness   Associated symptoms: no dysuria    HPI Comments: Cynthia Odonnell is a 29 y.o. female who presents to the Emergency Department complaining of constant, worsening 8/10 lower back pain that has been present since last night with associated numbness to both of her feet. Pt states that she has had 3/10 back pain for around a year since she last gave birth, but notes the pain the last two days has been, "unbearable" as she cannot even bend over to pick up her child. Pt notes that her original back pain began 6 years ago when she had her first child, and then went away afterwards until recently when she had another one. Pt states her pain is worse with movement. Pt reports she is scheduled to get an MRI tomorrow and follow up with her PCP to get the results. Pt states she got an X-ray recently and states she was told there was an abnormal curvature of her lumbar spine. Pt denies dysuria.          Past Medical History  Diagnosis Date  . Optic neuritis     left eye  . Depression   . Termination of pregnancy (fetus)     x 2  . Smoker   . Bronchitis     Hx - uses albuterol  inhaler prn  . Anxiety   . GERD (gastroesophageal reflux disease)     zantac prn  . Headache(784.0)     otc prn  . Abnormal Pap smear of cervix    Past Surgical History  Procedure Laterality Date  . Cesarean section  07/31/2008    [facility]  . Wisdom tooth extraction    . Colposcopy    . Cesarean section N/A 11/04/2014    Procedure: CESAREAN SECTION;  Surgeon: Tracey Harries, MD;  Location: [facility];  Service: Obstetrics;  Laterality: N/A;   Family History  Problem Relation Age of Onset  . Other Neg Hx    History  Substance Use Topics  . Smoking status: Current Every Day Smoker -- 1.00 packs/day for 16 years    Types: Cigarettes  . Smokeless tobacco: Never Used  . Alcohol Use: No   OB History    Gravida Para Term Preterm AB TAB SAB Ectopic Multiple Living   4 2 2  2 2    0 2     Review of Systems  Gastrointestinal: Negative for nausea, vomiting and diarrhea.  Genitourinary: Negative for dysuria.  Musculoskeletal: Positive for back pain and arthralgias.  Neurological: Positive for numbness.  All other systems reviewed and are negative.     Allergies  Amoxicillin; Codeine; Penicillins; Prednisone; Augmentin; and Tramadol  Home Medications   Prior to Admission medications   Medication Sig Start Date End Date Taking? Authorizing Provider  acetaminophen (TYLENOL) 325 MG tablet Take 650 mg by mouth every 6 (six) hours as needed for headache.    Historical Provider, MD  albuterol (PROVENTIL HFA;VENTOLIN HFA) 108 (90 BASE) MCG/ACT inhaler Inhale 1-2 puffs into the lungs every 6 (six) hours as needed for wheezing or shortness of breath. 02/24/14   Rolan Bucco, MD  clonazePAM (KLONOPIN) 0.5 MG tablet Take 0.5 mg by mouth at bedtime as needed for anxiety.     Historical Provider, MD  hydrOXYzine (VISTARIL) 25 MG capsule Take 25 mg by mouth at bedtime as needed for anxiety or itching.  06/21/14   Historical Provider, MD  ibuprofen (ADVIL,MOTRIN) 600 MG tablet Take 1 tablet (600 mg  total) by mouth every 6 (six) hours as needed. Patient taking differently: Take 600 mg by mouth every 6 (six) hours as needed for mild pain.  11/06/14   Tracey Harries, MD  oxyCODONE-acetaminophen (PERCOCET/ROXICET) 5-325 MG per tablet Take 1 tablet by mouth every 6 (six) hours as needed (for pain scale 4-7). 11/06/14   Tracey Harries, MD  Prenatal Vit-Fe Fumarate-FA (PRENATAL VITAMIN PO) Take 1 tablet by mouth daily.     Historical Provider, MD  ranitidine (ZANTAC) 75 MG tablet Take 150 mg by mouth daily as needed for heartburn.     Historical Provider, MD  sertraline (ZOLOFT) 50 MG tablet Take 150 mg by mouth at bedtime. Takes with 100 mg to equal 150 mg total    Historical Provider, MD   BP 126/76 mmHg  Pulse 88  Temp(Src) 98 F (36.7 C) (Oral)  Resp 20  Ht  (1.676 m)  Wt 218 lb (98.884 kg)  BMI 35.20 kg/m2  SpO2 100%  LMP 02/04/2015 Physical Exam  Constitutional: She is oriented to person, place, and time. She appears well-developed and well-nourished.  HENT:  Head: Normocephalic and atraumatic.  Right Ear: External ear normal.  Left Ear: External ear normal.  Eyes: Conjunctivae and EOM are normal. Pupils are equal, round, and reactive to light.  Neck: Normal range of motion. Neck supple.  Cardiovascular: Normal rate, regular rhythm, normal heart sounds and intact distal pulses.   Pulmonary/Chest: Effort normal and breath sounds normal.  Abdominal: Soft. Bowel sounds are normal. There is no tenderness.  Musculoskeletal: Normal range of motion.       Thoracic back: She exhibits tenderness and bony tenderness.       Lumbar back: She exhibits tenderness and bony tenderness.  Neurological: She is alert and oriented to person, place, and time. She has normal strength and normal reflexes. No sensory deficit.  Skin: Skin is warm and dry.  Vitals reviewed.   ED Course  Procedures (including critical care time)  DIAGNOSTIC STUDIES: Oxygen Saturation is 100% on room air, normal by my  interpretation.    COORDINATION OF CARE: 6:20 PM Discussed treatment plan with pt at bedside and pt agreed to plan.   Labs Review Labs Reviewed  URINALYSIS, ROUTINE W REFLEX MICROSCOPIC (NOT AT [facility]) - Abnormal; Notable for the following:    APPearance CLOUDY (*)    All other components within normal limits  PREGNANCY, URINE    Imaging Review No results found.   EKG Interpretation None      MDM   Final diagnoses:  Midline low back pain, with sciatica presence unspecified    29 y.o. female  with pertinent PMH of chronic back pain presents with acute on chronic back pain for the last day. Patient has a scheduled MRI for tomorrow, is to follow-up with a surgeon. She denies historical features of cauda equina, exam reassuring. She had plain radiography done within the last 2 weeks for similar pain which was unremarkable. Discussed that she would need to follow-up for this pain and that I could not prescribe her any controlled substances. The patient is also driving so cannot provide narcotics for muscle relaxants in the department. Discharge home in stable condition with standard return precautions..    I have reviewed all laboratory and imaging studies if ordered as above  1. Midline low back pain, with sciatica presence unspecified           Cynthia MoMatthew Gentry, MD 02/06/15 [phone]

## 2015-02-06 NOTE — ED Notes (Signed)
Pt reports hx of pinched nerve, scoliosis, having lower back pain.  Radiation down ble.  Reports n/t in toes bilateral, denies loss of control of bowel/bladder.

## 2015-02-06 NOTE — Discharge Instructions (Signed)

## 2015-02-07 ENCOUNTER — Ambulatory Visit
Admission: RE | Admit: 2015-02-07 | Discharge: 2015-02-07 | Disposition: A | Discharge status: Home / Self Care | Payer: Medicaid Other | Source: Ambulatory Visit | Attending: Family | Admitting: Family

## 2015-02-07 DIAGNOSIS — M5431 Sciatica, right side: Secondary | ICD-10-CM

## 2015-04-26 IMAGING — CR DG CHEST 2V
2 series · 2 of 2 positions shown · non-contrast
Comparison: 03/23/2014

CLINICAL DATA: Cough. Congestion. Fever. Chest tightness. Shortness
of breath. Duration: 2 days. Third trimester pregnancy.

EXAM:
CHEST  2 VIEW

[w chest pa]
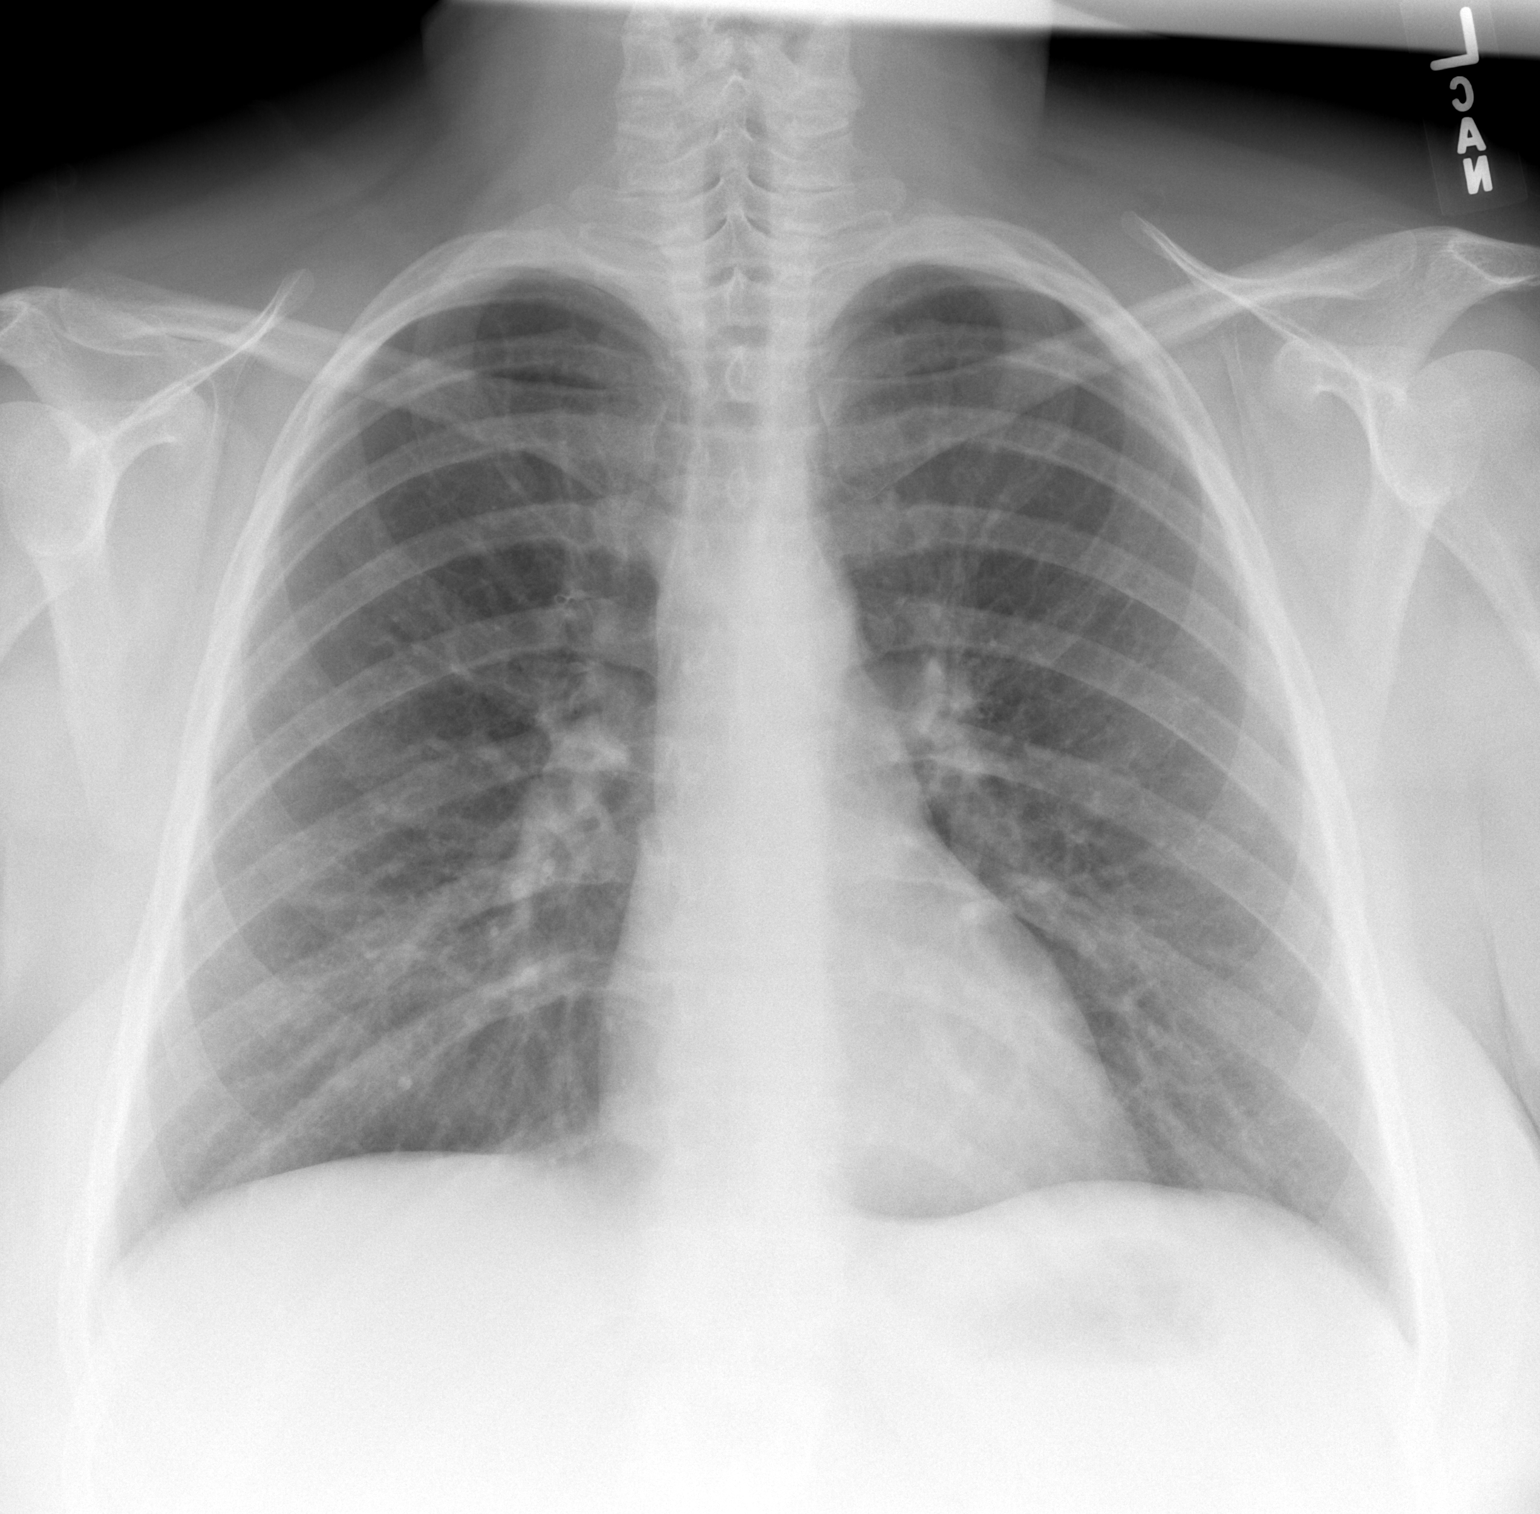

[w chest lat]
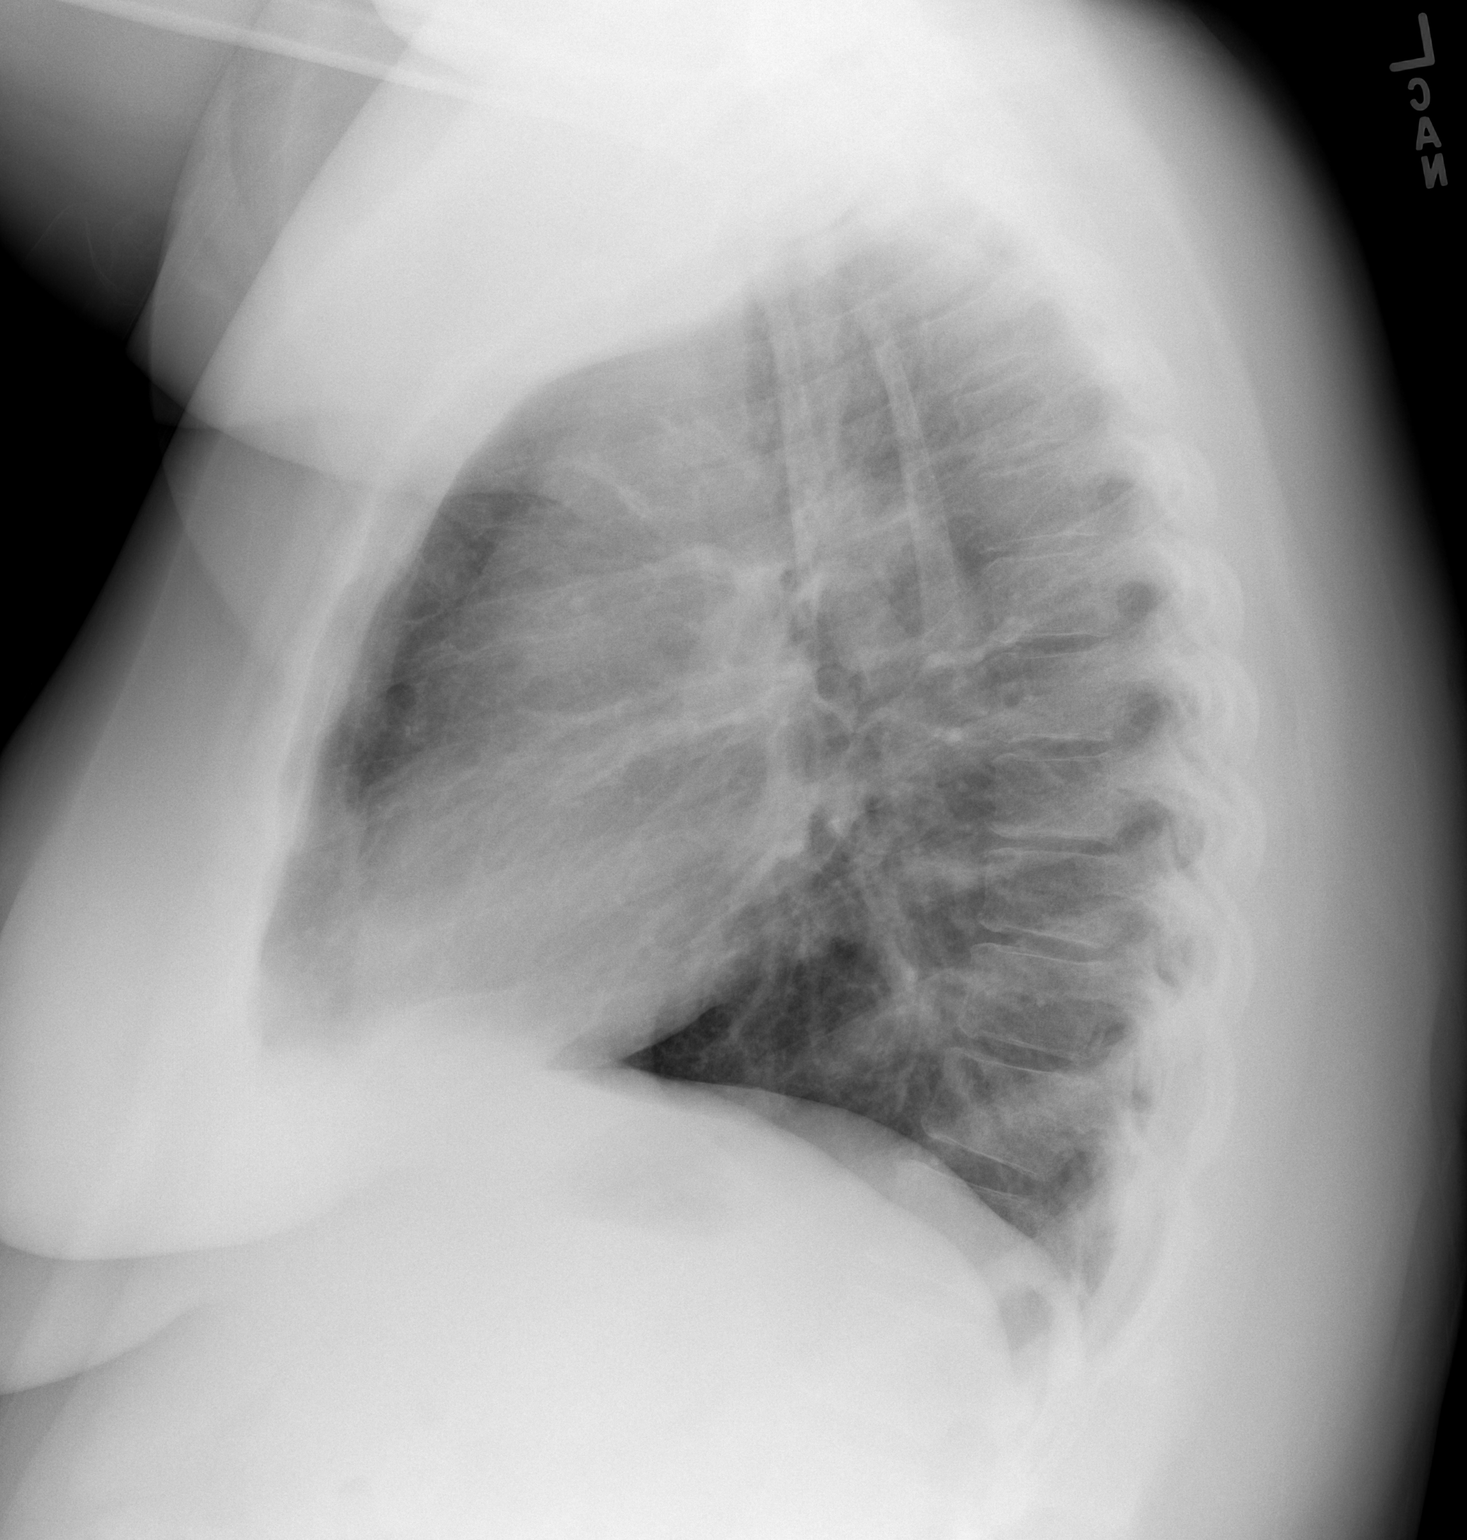

[2 of 2 positions shown; findings below may reference images not displayed]

FINDINGS: The heart size and mediastinal contours are within normal limits.
Both lungs are clear. The visualized skeletal structures are
unremarkable.
IMPRESSION: No active cardiopulmonary disease.

## 2015-05-12 ENCOUNTER — Encounter (HOSPITAL_BASED_OUTPATIENT_CLINIC_OR_DEPARTMENT_OTHER): Payer: Self-pay | Admitting: Emergency Medicine

## 2015-05-12 ENCOUNTER — Emergency Department (HOSPITAL_BASED_OUTPATIENT_CLINIC_OR_DEPARTMENT_OTHER)
Admission: EM | Admit: 2015-05-12 | Discharge: 2015-05-12 | Disposition: A | Discharge status: Home / Self Care | Payer: Medicaid Other | Attending: Emergency Medicine | Admitting: Emergency Medicine

## 2015-05-12 DIAGNOSIS — J3489 Other specified disorders of nose and nasal sinuses: Secondary | ICD-10-CM | POA: Insufficient documentation

## 2015-05-12 DIAGNOSIS — R509 Fever, unspecified: Secondary | ICD-10-CM | POA: Diagnosis present

## 2015-05-12 DIAGNOSIS — Z3202 Encounter for pregnancy test, result negative: Secondary | ICD-10-CM | POA: Insufficient documentation

## 2015-05-12 DIAGNOSIS — R61 Generalized hyperhidrosis: Secondary | ICD-10-CM | POA: Diagnosis not present

## 2015-05-12 DIAGNOSIS — K219 Gastro-esophageal reflux disease without esophagitis: Secondary | ICD-10-CM | POA: Diagnosis not present

## 2015-05-12 DIAGNOSIS — Z880 Allergy status to penicillin: Secondary | ICD-10-CM | POA: Insufficient documentation

## 2015-05-12 DIAGNOSIS — F329 Major depressive disorder, single episode, unspecified: Secondary | ICD-10-CM | POA: Diagnosis not present

## 2015-05-12 DIAGNOSIS — J029 Acute pharyngitis, unspecified: Secondary | ICD-10-CM | POA: Diagnosis not present

## 2015-05-12 DIAGNOSIS — R6883 Chills (without fever): Secondary | ICD-10-CM | POA: Diagnosis not present

## 2015-05-12 DIAGNOSIS — Z8669 Personal history of other diseases of the nervous system and sense organs: Secondary | ICD-10-CM | POA: Insufficient documentation

## 2015-05-12 DIAGNOSIS — R0981 Nasal congestion: Secondary | ICD-10-CM | POA: Insufficient documentation

## 2015-05-12 DIAGNOSIS — F419 Anxiety disorder, unspecified: Secondary | ICD-10-CM | POA: Insufficient documentation

## 2015-05-12 DIAGNOSIS — M545 Low back pain, unspecified: Secondary | ICD-10-CM

## 2015-05-12 DIAGNOSIS — Z79899 Other long term (current) drug therapy: Secondary | ICD-10-CM | POA: Diagnosis not present

## 2015-05-12 DIAGNOSIS — Z720 Tobacco use: Secondary | ICD-10-CM | POA: Insufficient documentation

## 2015-05-12 DIAGNOSIS — R51 Headache: Secondary | ICD-10-CM | POA: Insufficient documentation

## 2015-05-12 LAB — URINALYSIS, ROUTINE W REFLEX MICROSCOPIC
Glucose, UA: NEGATIVE mg/dL
HGB URINE DIPSTICK: NEGATIVE
Ketones, ur: 15 mg/dL — AB
Nitrite: NEGATIVE
PROTEIN: NEGATIVE mg/dL
Specific Gravity, Urine: 1.023 (ref 1.005–1.030)
Urobilinogen, UA: 1 mg/dL (ref 0.0–1.0)
pH: 6 (ref 5.0–8.0)

## 2015-05-12 LAB — PREGNANCY, URINE: Preg Test, Ur: NEGATIVE

## 2015-05-12 LAB — URINE MICROSCOPIC-ADD ON

## 2015-05-12 NOTE — ED Notes (Signed)
Pt states she was walking between cars and hit her left lower back on a rear view mirror and since then has had a low grade fever and chills.  + congestion and mild cough with runny nose.  + nausea.

## 2015-05-12 NOTE — Discharge Instructions (Signed)
You were evaluated in the ED stay and there does not appear to be an emergent cause for her symptoms at this time. Her symptoms are likely partly due to a viral syndrome. Your back injury will heal on its own. You may continue taking Motrin and Tylenol as needed for your discomfort. Please stay well hydrated and drink plenty of water or Gatorade. Follow-up with your doctor as needed. Return to ED for worsening symptoms.

## 2015-05-12 NOTE — ED Provider Notes (Signed)
CSN: [id number]     Arrival date & time 05/12/15  1729 History  By signing my name below, I, Budd Palmer, attest that this documentation has been prepared under the direction and in the presence of [employer], PA-C. Electronically Signed: Budd Palmer, ED Scribe. 05/12/2015. 6:53 PM.      Chief Complaint  Patient presents with  . Fever   The history is provided by the patient. No language interpreter was used.   HPI Comments: Cynthia Odonnell is a 29 y.o. female who presents to the Emergency Department complaining of fever (Tmax 100.3) onset 2 days ago. She reports associated chills, diaphoresis, rhinorrhea, congestion, dull HA, and sore throat (resolved). She states she took nyquil last night with no relief. She has had a sick contact with strep throat. She notes she hit her back on a rearview mirror the day before the fever began. Pt denies cough. No other aggravating or modifying factors.  Past Medical History  Diagnosis Date  . Optic neuritis     left eye  . Depression   . Termination of pregnancy (fetus)     x 2  . Smoker   . Bronchitis     Hx - uses albuterol inhaler prn  . Anxiety   . GERD (gastroesophageal reflux disease)     zantac prn  . Headache(784.0)     otc prn  . Abnormal Pap smear of cervix    Past Surgical History  Procedure Laterality Date  . Cesarean section  07/31/2008    [facility]  . Wisdom tooth extraction    . Colposcopy    . Cesarean section N/A 11/04/2014    Procedure: CESAREAN SECTION;  Surgeon: Tracey Harries, MD;  Location: [facility];  Service: Obstetrics;  Laterality: N/A;   Family History  Problem Relation Age of Onset  . Other Neg Hx    Social History  Substance Use Topics  . Smoking status: Current Every Day Smoker -- 1.00 packs/day for 16 years    Types: Cigarettes  . Smokeless tobacco: Never Used  . Alcohol Use: No   OB History    Gravida Para Term Preterm AB TAB SAB Ectopic Multiple Living   0 2     Review of Systems   Constitutional: Positive for fever, chills and diaphoresis.  HENT: Positive for congestion and rhinorrhea.   Respiratory: Negative for cough.   Neurological: Positive for headaches.  All other systems reviewed and are negative.     Allergies  Amoxicillin; Codeine; Penicillins; Prednisone; Augmentin; Lamictal; and Tramadol  Home Medications   Prior to Admission medications   Medication Sig Start Date End Date Taking? Authorizing Provider  acetaminophen (TYLENOL) 325 MG tablet Take 650 mg by mouth every 6 (six) hours as needed for headache.    Historical Provider, MD  albuterol (PROVENTIL HFA;VENTOLIN HFA) 108 (90 BASE) MCG/ACT inhaler Inhale 1-2 puffs into the lungs every 6 (six) hours as needed for wheezing or shortness of breath. 02/24/14   Rolan Bucco, MD  clonazePAM (KLONOPIN) 0.5 MG tablet Take 0.5 mg by mouth at bedtime as needed for anxiety.     Historical Provider, MD  hydrOXYzine (VISTARIL) 25 MG capsule Take 25 mg by mouth at bedtime as needed for anxiety or itching.  06/21/14   Historical Provider, MD  ibuprofen (ADVIL,MOTRIN) 600 MG tablet Take 1 tablet (600 mg total) by mouth every 6 (six) hours as needed. Patient taking differently: Take 600 mg by mouth  every 6 (six) hours as needed for mild pain.  11/06/14   Tracey Harries, MD  oxyCODONE-acetaminophen (PERCOCET/ROXICET) 5-325 MG per tablet Take 1 tablet by mouth every 6 (six) hours as needed (for pain scale 4-7). 11/06/14   Tracey Harries, MD  Prenatal Vit-Fe Fumarate-FA (PRENATAL VITAMIN PO) Take 1 tablet by mouth daily.     Historical Provider, MD  ranitidine (ZANTAC) 75 MG tablet Take 150 mg by mouth daily as needed for heartburn.     Historical Provider, MD  sertraline (ZOLOFT) 50 MG tablet Take 150 mg by mouth at bedtime. Takes with 100 mg to equal 150 mg total    Historical Provider, MD   BP 110/86 mmHg  Pulse 98  Temp(Src) 98.8 F (37.1 C) (Oral)  Resp 18  Ht 5\' 6"  (1.676 m)  Wt 200 lb (90.719 kg)  BMI 32.30 kg/m2   SpO2 99%  LMP 04/30/2015 (Exact Date) Physical Exam  Constitutional: She is oriented to person, place, and time. She appears well-developed and well-nourished.  HENT:  Head: Normocephalic.  Mouth/Throat: Oropharynx is clear and moist.  Eyes: EOM are normal.  Neck: Normal range of motion.  No meningismus or nuchal rigidity  Cardiovascular: Normal rate, regular rhythm and normal heart sounds.   Pulmonary/Chest: Effort normal and breath sounds normal. No respiratory distress.  Abdominal: She exhibits no distension.  Musculoskeletal: Normal range of motion. She exhibits edema.  Area of ecchymosis on R paraspinal lumbar region  Neurological: She is alert and oriented to person, place, and time.  Psychiatric: She has a normal mood and affect.  Nursing note and vitals reviewed.   ED Course  Procedures  DIAGNOSTIC STUDIES: Oxygen Saturation is 99% on RA, normal by my interpretation.    COORDINATION OF CARE: 6:50 PM - Discussed plans to discharge. Advised to take tylenol and ibuprofen as well as to stay hydrated. Pt advised of plan for treatment and pt agrees.  Labs Review Labs Reviewed  PREGNANCY, URINE  URINALYSIS, ROUTINE W REFLEX MICROSCOPIC (NOT AT [facility])    Imaging Review No results found. I have personally reviewed and evaluated these images and lab results as part of my medical decision-making.   EKG Interpretation None     Meds given in ED:  Medications - No data to display  Discharge Medication List as of 05/12/2015  7:02 PM     Filed Vitals:   05/12/15 1745 05/12/15 1746  BP: 94/64 110/86  Pulse: 98   Temp: 98.8 F (37.1 C)   TempSrc: Oral   Resp: 18   Height: 5\' 6"  (1.676 m)   Weight: 200 lb (90.719 kg)   SpO2: 99%     MDM  Patient presents for evaluation of subjective fevers and chills after hitting her lower back on a rearview mirror while walking through a parking lot. Patient is hemodynamically stable in the ED, afebrile. Symptoms likely secondary  to viral process. Symptoms are mild. There is small amount of ecchymosis in left lower back. Patient maintains full active range of motion. Gait is baseline. Neurovascularly intact. Discuss continued use of Motrin and Tylenol at home for discomfort. Stay well hydrated. Patient verbalizes understanding and agrees to plan. No evidence of other acute or emergent pathology at this time. Overall, patient appears well, nontoxic and is hemodynamically stable. Appropriate for discharge. Encouraged further follow-up with PCP in one week for reevaluation.  Final diagnoses:  Chills  Left-sided low back pain without sciatica   I personally performed the services described in this  documentation, which was scribed in my presence. The recorded information has been reviewed and is accurate.   Joycie Peek, PA-C 05/12/15 2322  Rolland Porter, MD 05/13/15 2256

## 2015-06-29 ENCOUNTER — Emergency Department (HOSPITAL_BASED_OUTPATIENT_CLINIC_OR_DEPARTMENT_OTHER)
Admission: EM | Admit: 2015-06-29 | Discharge: 2015-06-29 | Disposition: A | Discharge status: Home / Self Care | Payer: Medicaid Other | Attending: Emergency Medicine | Admitting: Emergency Medicine

## 2015-06-29 ENCOUNTER — Emergency Department (HOSPITAL_BASED_OUTPATIENT_CLINIC_OR_DEPARTMENT_OTHER): Payer: Medicaid Other

## 2015-06-29 ENCOUNTER — Encounter (HOSPITAL_BASED_OUTPATIENT_CLINIC_OR_DEPARTMENT_OTHER): Payer: Self-pay

## 2015-06-29 DIAGNOSIS — Z8669 Personal history of other diseases of the nervous system and sense organs: Secondary | ICD-10-CM | POA: Insufficient documentation

## 2015-06-29 DIAGNOSIS — Z3202 Encounter for pregnancy test, result negative: Secondary | ICD-10-CM | POA: Insufficient documentation

## 2015-06-29 DIAGNOSIS — F329 Major depressive disorder, single episode, unspecified: Secondary | ICD-10-CM | POA: Diagnosis not present

## 2015-06-29 DIAGNOSIS — K219 Gastro-esophageal reflux disease without esophagitis: Secondary | ICD-10-CM | POA: Insufficient documentation

## 2015-06-29 DIAGNOSIS — F17210 Nicotine dependence, cigarettes, uncomplicated: Secondary | ICD-10-CM | POA: Diagnosis not present

## 2015-06-29 DIAGNOSIS — R0789 Other chest pain: Secondary | ICD-10-CM

## 2015-06-29 DIAGNOSIS — J40 Bronchitis, not specified as acute or chronic: Secondary | ICD-10-CM

## 2015-06-29 DIAGNOSIS — Z79899 Other long term (current) drug therapy: Secondary | ICD-10-CM | POA: Insufficient documentation

## 2015-06-29 DIAGNOSIS — F419 Anxiety disorder, unspecified: Secondary | ICD-10-CM | POA: Diagnosis not present

## 2015-06-29 DIAGNOSIS — R079 Chest pain, unspecified: Secondary | ICD-10-CM | POA: Diagnosis present

## 2015-06-29 DIAGNOSIS — Z880 Allergy status to penicillin: Secondary | ICD-10-CM | POA: Diagnosis not present

## 2015-06-29 LAB — URINALYSIS, ROUTINE W REFLEX MICROSCOPIC
Bilirubin Urine: NEGATIVE
Glucose, UA: NEGATIVE mg/dL
KETONES UR: NEGATIVE mg/dL
LEUKOCYTES UA: NEGATIVE
Nitrite: NEGATIVE
Protein, ur: NEGATIVE mg/dL
Specific Gravity, Urine: 1.021 (ref 1.005–1.030)
pH: 5.5 (ref 5.0–8.0)

## 2015-06-29 LAB — BASIC METABOLIC PANEL
ANION GAP: 8 (ref 5–15)
BUN: 8 mg/dL (ref 6–20)
CHLORIDE: 109 mmol/L (ref 101–111)
CO2: 22 mmol/L (ref 22–32)
Calcium: 8.8 mg/dL — ABNORMAL LOW (ref 8.9–10.3)
Creatinine, Ser: 0.91 mg/dL (ref 0.44–1.00)
GFR calc Af Amer: >60 mL/min (ref >60)
Glucose, Bld: 88 mg/dL (ref 65–99)
Potassium: 3.8 mmol/L (ref 3.5–5.1)
SODIUM: 139 mmol/L (ref 135–145)

## 2015-06-29 LAB — URINE MICROSCOPIC-ADD ON

## 2015-06-29 LAB — D-DIMER, QUANTITATIVE: D-Dimer, Quant: 0.71 ug/mL-FEU — ABNORMAL HIGH (ref 0.00–0.50)

## 2015-06-29 LAB — PREGNANCY, URINE: PREG TEST UR: NEGATIVE

## 2015-06-29 MED ORDER — BENZONATATE 100 MG PO CAPS: 100.0000 mg | ORAL_CAPSULE | Freq: Three times a day (TID) | ORAL | Status: DC

## 2015-06-29 MED ORDER — NAPROXEN 500 MG PO TABS
500.0000 mg | ORAL_TABLET | Freq: Two times a day (BID) | ORAL | Status: DC
Start: 2015-06-29 — End: 2017-07-23

## 2015-06-29 MED ORDER — IOHEXOL 350 MG/ML SOLN
100.0000 mL | Freq: Once | INTRAVENOUS | Status: AC | PRN
  Administered 2015-06-29: 100 mL via INTRAVENOUS

## 2015-06-29 MED ORDER — ALBUTEROL SULFATE HFA 108 (90 BASE) MCG/ACT IN AERS
2.0000 | INHALATION_SPRAY | RESPIRATORY_TRACT | Status: DC | PRN
  Administered 2015-06-29: 2 via RESPIRATORY_TRACT
  Filled 2015-06-29: qty 6.7

## 2015-06-29 NOTE — ED Notes (Signed)
Pt denies any abdominal pain, states she has pain in her left ribs x last night, pt states she has been "coughing hard" x 1 week, pain increases with coughing.

## 2015-06-29 NOTE — ED Provider Notes (Signed)
CSN: [id number]     Arrival date & time 06/29/15  1355 History   First MD Initiated Contact with Patient 06/29/15 1413     Chief Complaint  Patient presents with  . Abdominal Pain     (Consider location/radiation/quality/duration/timing/severity/associated sxs/prior Treatment) HPI Comments: Mother presents with complaint of chest pain in her left lower ribs, cough, shortness of breath. Cough is been ongoing for the last 1 week. Pleuritic pain in her left chest started last night. Pain is worse with coughing and deep breathing. Patient reports having an episode of hemoptysis yesterday blood tinged sputum. She denies fevers or wheezing. She uses Implanon for birth control. Patient denies other risk factors for pulmonary embolism including: unilateral leg swelling, history of DVT/PE/other blood clots, recent immobilizations, recent surgery, recent travel (>4hr segment), malignancy. Patient denies abdominal pain, nausea, vomiting, or diarrhea. No treatments prior to arrival. Course is constant. Nothing makes symptoms better. Patient is a smoker and admits to use of marijuana as well.    Patient is a 29 y.o. female presenting with abdominal pain. The history is provided by the patient.  Abdominal Pain Associated symptoms: cough and shortness of breath   Associated symptoms: no chest pain, no diarrhea, no dysuria, no fever, no nausea, no sore throat and no vomiting     Past Medical History  Diagnosis Date  . Optic neuritis     left eye  . Depression   . Termination of pregnancy (fetus)     x 2  . Smoker   . Bronchitis     Hx - uses albuterol inhaler prn  . Anxiety   . GERD (gastroesophageal reflux disease)     zantac prn  . Headache(784.0)     otc prn  . Abnormal Pap smear of cervix    Past Surgical History  Procedure Laterality Date  . Cesarean section  07/31/2008    [facility]  . Wisdom tooth extraction    . Colposcopy    . Cesarean section N/A 11/04/2014    Procedure: CESAREAN  SECTION;  Surgeon: Tracey Harrieshomas Henley, MD;  Location: [facility];  Service: Obstetrics;  Laterality: N/A;   Family History  Problem Relation Age of Onset  . Other Neg Hx    Social History  Substance Use Topics  . Smoking status: Current Every Day Smoker -- 1.00 packs/day for 16 years    Types: Cigarettes  . Smokeless tobacco: Never Used  . Alcohol Use: No   OB History    Gravida Para Term Preterm AB TAB SAB Ectopic Multiple Living   4 2 2  2 2    0 2     Review of Systems  Constitutional: Negative for fever.  HENT: Negative for rhinorrhea and sore throat.   Eyes: Negative for redness.  Respiratory: Positive for cough and shortness of breath. Negative for wheezing.   Cardiovascular: Negative for chest pain.  Gastrointestinal: Negative for nausea, vomiting, abdominal pain and diarrhea.  Genitourinary: Negative for dysuria.  Musculoskeletal: Negative for myalgias.  Skin: Negative for rash.  Neurological: Negative for headaches.      Allergies  Amoxicillin; Codeine; Penicillins; Prednisone; Augmentin; Lamictal; and Tramadol  Home Medications   Prior to Admission medications   Medication Sig Start Date End Date Taking? Authorizing Provider  FLUoxetine (PROZAC) 20 MG tablet Take 20 mg by mouth daily.   Yes Historical Provider, MD  albuterol (PROVENTIL HFA;VENTOLIN HFA) 108 (90 BASE) MCG/ACT inhaler Inhale 1-2 puffs into the lungs every 6 (six) hours as needed for  wheezing or shortness of breath. 02/24/14   Rolan Bucco, MD  ibuprofen (ADVIL,MOTRIN) 600 MG tablet Take 1 tablet (600 mg total) by mouth every 6 (six) hours as needed. Patient taking differently: Take 600 mg by mouth every 6 (six) hours as needed for mild pain.  11/06/14   Tracey Harries, MD  oxyCODONE-acetaminophen (PERCOCET/ROXICET) 5-325 MG per tablet Take 1 tablet by mouth every 6 (six) hours as needed (for pain scale 4-7). 11/06/14   Tracey Harries, MD   BP 112/78 mmHg  Pulse 72  Temp(Src) 98.1 F (36.7 C) (Oral)  Resp 18   Ht  (1.676 m)  Wt 97.977 kg  BMI 34.88 kg/m2  SpO2 96%  LMP 06/05/2015   Physical Exam  Constitutional: She appears well-developed and well-nourished.  HENT:  Head: Normocephalic and atraumatic.  Mouth/Throat: Oropharynx is clear and moist.  Eyes: Conjunctivae are normal. Right eye exhibits no discharge. Left eye exhibits no discharge.  Neck: Normal range of motion. Neck supple.  Cardiovascular: Normal rate, regular rhythm and normal heart sounds.   No murmur heard. Pulmonary/Chest: Effort normal and breath sounds normal. No respiratory distress. She has no wheezes. She exhibits tenderness (Left lateral lower ribs).  Abdominal: Soft. There is no tenderness.  Musculoskeletal: She exhibits no tenderness.  Neurological: She is alert.  Skin: Skin is warm and dry.  Psychiatric: She has a normal mood and affect.  Nursing note and vitals reviewed.   ED Course  Procedures (including critical care time) Labs Review Labs Reviewed  URINALYSIS, ROUTINE W REFLEX MICROSCOPIC (NOT AT [facility]) - Abnormal; Notable for the following:    Hgb urine dipstick MODERATE (*)    All other components within normal limits  URINE MICROSCOPIC-ADD ON - Abnormal; Notable for the following:    Squamous Epithelial / LPF 6-30 (*)    Bacteria, UA FEW (*)    All other components within normal limits  PREGNANCY, URINE  D-DIMER, QUANTITATIVE (NOT AT [facility])    Imaging Review Dg Chest 2 View  06/29/2015  CLINICAL DATA:  Chest congestion for 1 week. Left lung pain when breathing last night. Smoking history. EXAM: CHEST - 2 VIEW COMPARISON:  Two-view chest x-ray 08/22/2014. FINDINGS: The heart size and mediastinal contours are within normal limits. Both lungs are clear. The visualized skeletal structures are unremarkable. IMPRESSION: Negative two view chest x-ray Electronically Signed   By: Marin Roberts M.D.   On: 06/29/2015 14:59   Ct Angio Chest Pe W/cm &/or Wo Cm  06/29/2015  CLINICAL DATA:  Left  side chest pain for 1 day, elevated D-dimer EXAM: CT ANGIOGRAPHY CHEST WITH CONTRAST TECHNIQUE: Multidetector CT imaging of the chest was performed using the standard protocol during bolus administration of intravenous contrast. Multiplanar CT image reconstructions and MIPs were obtained to evaluate the vascular anatomy. CONTRAST:  OMNIPAQUE IOHEXOL 350 MG/ML SOLN COMPARISON:  None. FINDINGS: The study is of excellent technical quality. There is no pulmonary embolus. Images of the thoracic inlet are unremarkable. Central airways are patent. No mediastinal hematoma or adenopathy. Heart size within normal limits. No pericardial effusion. Visualized upper abdomen is unremarkable. No rib fractures are identified.  No destructive bony lesions. There is no hilar adenopathy. Sagittal images of the thoracic spine and sternum are unremarkable. Images of the lung parenchyma shows no acute infiltrate or pleural effusion. No focal consolidation. No pneumothorax. Review of the MIP images confirms the above findings. IMPRESSION: 1. No pulmonary embolus. 2. No mediastinal hematoma or adenopathy. 3. No acute infiltrate  or pulmonary edema. Electronically Signed   By: Natasha Mead M.D.   On: 06/29/2015 16:52   I have personally reviewed and evaluated these images and lab results as part of my medical decision-making.   EKG Interpretation None      2:48 PM Patient seen and examined. Patient with pleuritic chest pain, shortness of breath, and hemoptysis. Likely bronchitis, however given constellation of symptoms will obtain chest x-ray and check a d-dimer. I had a discussion with the patient regarding need to proceed with CT if d-dimer were to be positive. She agrees to proceed with workup. Feel low-risk for PE. Vital signs are reassuring.  Vital signs reviewed and are as follows: BP 112/78 mmHg  Pulse 72  Temp(Src) 98.1 F (36.7 C) (Oral)  Resp 18  Ht  (1.676 m)  Wt 97.977 kg  BMI 34.88 kg/m2  SpO2 96%   LMP 06/05/2015  5:14 PM d-dimer was elevated at 0.71. Given risk factors of Implanon, smoking, hemoptysis -- proceeded with CT scan to rule out PE. Imaging was performed and was negative for PE or pneumonia.  Patient informed. Will discharge to home with treatment for bronchitis including albuterol inhaler, Tessalon, naproxen for chest wall pain. Patient encouraged to follow-up with PCP in 5 days if symptoms are not improved. Encouraged to return with worsening or changing symptoms, fever, shortness of breath or other concerns. Patient verbalizes understanding and agrees with plan.    MDM   Final diagnoses:  Bronchitis  Chest wall pain   Patient with pleuritic pain and cough evaluated with workup as above. No evidence of PE or pneumonia. Patient appears well. Vital signs are stable. Feel that she is stable for discharge to home with supportive treatment.    Renne Crigler, PA-C 06/29/15 1716  Melene Plan, DO 06/30/15 [phone]

## 2015-06-29 NOTE — Discharge Instructions (Signed)
Please read and follow all provided instructions.  Your diagnoses today include:  1. Bronchitis   2. Chest wall pain     Tests performed today include:  Chest x-ray - does not show any pneumonia  D-dimer test - was high  Chest CT - does not show any blood clots or pneumonia  Vital signs. See below for your results today.   Medications prescribed:   Albuterol inhaler - medication that opens up your airway  Use inhaler as follows: 1-2 puffs with spacer every 4 hours as needed for wheezing, cough, or shortness of breath.    Tessalon Perles - cough suppressant medication   Naproxen - anti-inflammatory pain medication  Do not exceed 500mg  naproxen every 12 hours, take with food  You have been prescribed an anti-inflammatory medication or NSAID. Take with food. Take smallest effective dose for the shortest duration needed for your pain. Stop taking if you experience stomach pain or vomiting.   Take any prescribed medications only as directed.  Home care instructions:  Follow any educational materials contained in this packet.  Follow-up instructions: Please follow-up with your primary care provider in the next 3-5 days for further evaluation of your symptoms and a recheck if you are not feeling better.   Return instructions:   Please return to the Emergency Department if you experience worsening symptoms.  Please return with worsening wheezing, shortness of breath, or difficulty breathing.  Return with persistent fever above 101F.   Please return if you have any other emergent concerns.  Additional Information:  Your vital signs today were: BP 112/78 mmHg   Pulse 72   Temp(Src) 98.1 F (36.7 C) (Oral)   Resp 18   Ht 5\' 6"  (1.676 m)   Wt 97.977 kg   BMI 34.88 kg/m2   SpO2 96%   LMP 06/05/2015 If your blood pressure (BP) was elevated above 135/85 this visit, please have this repeated by your doctor within one month. --------------

## 2015-06-29 NOTE — ED Notes (Signed)
Patient transported to CT 

## 2015-06-29 NOTE — ED Notes (Signed)
C/o LUQ,flank pain x 2 days

## 2017-07-23 ENCOUNTER — Emergency Department (HOSPITAL_BASED_OUTPATIENT_CLINIC_OR_DEPARTMENT_OTHER)
Admission: EM | Admit: 2017-07-23 | Discharge: 2017-07-23 | Disposition: A | Discharge status: Home / Self Care | Payer: Medicaid Other | Attending: Emergency Medicine | Admitting: Emergency Medicine

## 2017-07-23 ENCOUNTER — Other Ambulatory Visit: Payer: Self-pay

## 2017-07-23 ENCOUNTER — Emergency Department (HOSPITAL_BASED_OUTPATIENT_CLINIC_OR_DEPARTMENT_OTHER): Payer: Medicaid Other

## 2017-07-23 ENCOUNTER — Encounter (HOSPITAL_BASED_OUTPATIENT_CLINIC_OR_DEPARTMENT_OTHER): Payer: Self-pay

## 2017-07-23 DIAGNOSIS — Z720 Tobacco use: Secondary | ICD-10-CM

## 2017-07-23 DIAGNOSIS — J40 Bronchitis, not specified as acute or chronic: Secondary | ICD-10-CM | POA: Insufficient documentation

## 2017-07-23 DIAGNOSIS — R05 Cough: Secondary | ICD-10-CM | POA: Diagnosis present

## 2017-07-23 DIAGNOSIS — L298 Other pruritus: Secondary | ICD-10-CM | POA: Diagnosis not present

## 2017-07-23 DIAGNOSIS — F17210 Nicotine dependence, cigarettes, uncomplicated: Secondary | ICD-10-CM | POA: Diagnosis not present

## 2017-07-23 DIAGNOSIS — N898 Other specified noninflammatory disorders of vagina: Secondary | ICD-10-CM

## 2017-07-23 HISTORY — DX: Cocaine abuse, in remission: F14.11

## 2017-07-23 HISTORY — DX: Unspecified osteoarthritis, unspecified site: M19.90

## 2017-07-23 LAB — URINALYSIS, ROUTINE W REFLEX MICROSCOPIC
BILIRUBIN URINE: NEGATIVE
Glucose, UA: NEGATIVE mg/dL
Ketones, ur: NEGATIVE mg/dL
LEUKOCYTES UA: NEGATIVE
Nitrite: NEGATIVE
Protein, ur: NEGATIVE mg/dL
Specific Gravity, Urine: >1.03 — ABNORMAL HIGH (ref 1.005–1.030)
pH: 6 (ref 5.0–8.0)

## 2017-07-23 LAB — URINALYSIS, MICROSCOPIC (REFLEX)

## 2017-07-23 LAB — PREGNANCY, URINE: Preg Test, Ur: NEGATIVE

## 2017-07-23 MED ORDER — FLUCONAZOLE 100 MG PO TABS
200.0000 mg | ORAL_TABLET | Freq: Once | ORAL | Status: AC
  Administered 2017-07-23: 200 mg via ORAL
  Filled 2017-07-23: qty 2

## 2017-07-23 MED ORDER — KETOROLAC TROMETHAMINE 60 MG/2ML IM SOLN
30.0000 mg | Freq: Once | INTRAMUSCULAR | Status: AC
  Administered 2017-07-23: 30 mg via INTRAMUSCULAR
  Filled 2017-07-23: qty 2

## 2017-07-23 MED ORDER — ALBUTEROL SULFATE HFA 108 (90 BASE) MCG/ACT IN AERS
2.0000 | INHALATION_SPRAY | Freq: Once | RESPIRATORY_TRACT | Status: AC
  Administered 2017-07-23: 2 via RESPIRATORY_TRACT
  Filled 2017-07-23: qty 6.7

## 2017-07-23 NOTE — ED Provider Notes (Signed)
[facility] EMERGENCY DEPARTMENT Provider Note   CSN: [id number] Arrival date & time: 07/23/17  1149     History   Chief Complaint Chief Complaint  Patient presents with  . Cough  . Vaginal Itching   HPI   Blood pressure 132/87, pulse 83, temperature 98.3 F (36.8 C), temperature source Oral, resp. rate 18, height 5\' 6"  (1.676 m), weight 98.9 kg (218 lb), last menstrual period 07/09/2017, SpO2 100 %, unknown if currently breastfeeding.  Cynthia Odonnell is a 31 y.o. female complaining of dry cough with pleuritic chest pain and low-grade fever over the course of the last 2 days.  States she is worried that she has a pneumonia.  She denies shortness of breath, history of DVT/PE, recent immobilizations, calf pain, leg swelling.  She also feels like she has a yeast infection but he does not want to take over-the-counter yeast medication she prefers pills.  She has had burning and irritation in the vaginal area with no specific dysuria, hematuria, abnormal vaginal discharge.  Past Medical History:  Diagnosis Date  . Abnormal Pap smear of cervix   . Anxiety   . Arthritis   . Bronchitis    Hx - uses albuterol inhaler prn  . Cocaine abuse, in remission (HCC)   . Depression   . GERD (gastroesophageal reflux disease)    zantac prn  . Headache(784.0)    otc prn  . Optic neuritis    left eye  . Smoker   . Termination of pregnancy (fetus)    x 2    Patient Active Problem List   Diagnosis Date Noted  . H/O cesarean section 11/04/2014  . [redacted] weeks gestation of pregnancy   . Maternal morbid obesity in third trimester, antepartum (HCC)   . Excessive weight gain in pregnancy in second trimester   . [redacted] weeks gestation of pregnancy   . Smoking   . Poor fetal growth affecting management of mother in third trimester, antepartum   . [redacted] weeks gestation of pregnancy   . Maternal morbid obesity in second trimester, antepartum (HCC)   . [redacted] weeks gestation of pregnancy   . Evaluate  anatomy not seen on prior sonogram   . Tobacco smoking affecting pregnancy in second trimester, antepartum   . Obesity affecting pregnancy in second trimester   . Uterine scar from previous cesarean delivery, antepartum complication   . Encounter for fetal anatomic survey   . [redacted] weeks gestation of pregnancy     Past Surgical History:  Procedure Laterality Date  . CESAREAN SECTION  07/31/2008   [facility]  . CESAREAN SECTION N/A 11/04/2014   Procedure: CESAREAN SECTION;  Surgeon: Tracey Harrieshomas Henley, MD;  Location: [facility];  Service: Obstetrics;  Laterality: N/A;  . COLPOSCOPY    . WISDOM TOOTH EXTRACTION      OB History    Gravida Para Term Preterm AB Living   4 2 2   2 2    SAB TAB Ectopic Multiple Live Births     2   0 2       Home Medications    Prior to Admission medications   Not on File    Family History Family History  Problem Relation Age of Onset  . Other Neg Hx     Social History Social History   Tobacco Use  . Smoking status: Current Every Day Smoker    Packs/day: 1.00    Years: 16.00    Pack years: 16.00  Types: Cigarettes  . Smokeless tobacco: Never Used  Substance Use Topics  . Alcohol use: No  . Drug use: No     Allergies   Amoxicillin; Codeine; Penicillins; Prednisone; Augmentin [amoxicillin-pot clavulanate]; Lamictal [lamotrigine]; and Tramadol   Review of Systems Review of Systems  A complete review of systems was obtained and all systems are negative except as noted in the HPI and PMH.   Physical Exam Updated Vital Signs BP 132/87 (BP Location: Right Arm)   Pulse 83   Temp 98.3 F (36.8 C) (Oral)   Resp 18   Ht 5\' 6"  (1.676 m)   Wt 98.9 kg (218 lb)   LMP 07/09/2017   SpO2 100%   BMI 35.19 kg/m   Physical Exam  Constitutional: She is oriented to person, place, and time. She appears well-developed and well-nourished. No distress.  HENT:  Head: Normocephalic and atraumatic.  Mouth/Throat: Oropharynx is clear and moist.  Eyes:  Conjunctivae and EOM are normal. Pupils are equal, round, and reactive to light.  Neck: Normal range of motion. No JVD present. No tracheal deviation present.  Cardiovascular: Normal rate, regular rhythm and intact distal pulses.  Radial pulse equal bilaterally  Pulmonary/Chest: Effort normal and breath sounds normal. No stridor. No respiratory distress. She has no wheezes. She has no rales. She exhibits no tenderness.  Abdominal: Soft. She exhibits no distension and no mass. There is no tenderness. There is no rebound and no guarding.  Genitourinary:  Genitourinary Comments: Chaperoned by RN mild erythema and inflammation with no satellite lesions or warmth  Musculoskeletal: Normal range of motion. She exhibits no edema or tenderness.  No calf asymmetry, superficial collaterals, palpable cords, edema, Homans sign negative bilaterally.    Neurological: She is alert and oriented to person, place, and time.  Skin: Skin is warm. She is not diaphoretic.  Psychiatric: She has a normal mood and affect.  Nursing note and vitals reviewed.    ED Treatments / Results  Labs (all labs ordered are listed, but only abnormal results are displayed) Labs Reviewed  URINALYSIS, ROUTINE W REFLEX MICROSCOPIC - Abnormal; Notable for the following components:      Result Value   Specific Gravity, Urine >1.030 (*)    Hgb urine dipstick TRACE (*)    All other components within normal limits  URINALYSIS, MICROSCOPIC (REFLEX) - Abnormal; Notable for the following components:   Bacteria, UA MANY (*)    Squamous Epithelial / LPF 6-30 (*)    All other components within normal limits  PREGNANCY, URINE    EKG  EKG Interpretation None       Radiology Dg Chest 2 View  Result Date: 07/23/2017 CLINICAL DATA:  Cough. EXAM: CHEST  2 VIEW COMPARISON:  06/29/2015 FINDINGS: The heart size and mediastinal contours are within normal limits. Both lungs are clear except for peribronchial thickening. The visualized  skeletal structures are unremarkable. IMPRESSION: Slight bronchitic changes. Electronically Signed   By: Francene Boyers M.D.   On: 07/23/2017 13:02    Procedures Procedures (including critical care time)  Medications Ordered in ED Medications  ketorolac (TORADOL) injection 30 mg (30 mg Intramuscular Given 07/23/17 1303)  fluconazole (DIFLUCAN) tablet 200 mg (200 mg Oral Given 07/23/17 1327)  albuterol (PROVENTIL HFA;VENTOLIN HFA) 108 (90 Base) MCG/ACT inhaler 2 puff (2 puffs Inhalation Given 07/23/17 1328)     Initial Impression / Assessment and Plan / ED Course  I have reviewed the triage vital signs and the nursing notes.  Pertinent labs &  imaging results that were available during my care of the patient were reviewed by me and considered in my medical decision making (see chart for details).    Vitals:   07/23/17 1213 07/23/17 1214  BP:  132/87  Pulse:  83  Resp:  18  Temp:  98.3 F (36.8 C)  TempSrc:  Oral  SpO2:  100%  Weight: 98.9 kg (218 lb)   Height: 5\' 6"  (1.676 m)     Medications  ketorolac (TORADOL) injection 30 mg (30 mg Intramuscular Given 07/23/17 1303)  fluconazole (DIFLUCAN) tablet 200 mg (200 mg Oral Given 07/23/17 1327)  albuterol (PROVENTIL HFA;VENTOLIN HFA) 108 (90 Base) MCG/ACT inhaler 2 puff (2 puffs Inhalation Given 07/23/17 1328)    Cynthia Odonnell is [age] y.o. female presenting with cough and vaginal Estring.  She declines pelvic exam.  External exam consistent with a mild yeast infection.  Lung sounds are clear, chest x-ray negative.  Counseled her on smoking cessation.  Evaluation does not show pathology that would require ongoing emergent intervention or inpatient treatment. Pt is hemodynamically stable and mentating appropriately. Discussed findings and plan with patient/guardian, who agrees with care plan. All questions answered. Return precautions discussed and outpatient follow up given.     Final Clinical Impressions(s) / ED Diagnoses    Final diagnoses:  Bronchitis  Vaginal itching  Tobacco use    ED Discharge Orders    None       Lynetta Mareisciotta, Mardella Laymanicole, PA-C 07/23/17 1530    Tilden Fossaees, Elizabeth, MD 07/23/17 [phone]

## 2017-07-23 NOTE — Discharge Instructions (Signed)
Please follow with your primary care doctor in the next 2 days for a check-up. They must obtain records for further management.  ° °Do not hesitate to return to the Emergency Department for any new, worsening or concerning symptoms.  ° °

## 2017-07-23 NOTE — ED Triage Notes (Signed)
C/o cough x 6 days, vaginal irritation/abd cramping x 4 days-NAD-steady gait

## 2017-09-21 ENCOUNTER — Encounter (HOSPITAL_BASED_OUTPATIENT_CLINIC_OR_DEPARTMENT_OTHER): Payer: Self-pay | Admitting: Emergency Medicine

## 2017-09-21 ENCOUNTER — Other Ambulatory Visit: Payer: Self-pay

## 2017-09-21 ENCOUNTER — Emergency Department (HOSPITAL_BASED_OUTPATIENT_CLINIC_OR_DEPARTMENT_OTHER): Payer: Self-pay

## 2017-09-21 ENCOUNTER — Emergency Department (HOSPITAL_BASED_OUTPATIENT_CLINIC_OR_DEPARTMENT_OTHER)
Admission: EM | Admit: 2017-09-21 | Discharge: 2017-09-21 | Disposition: A | Discharge status: Home / Self Care | Payer: Self-pay | Attending: Physician Assistant | Admitting: Physician Assistant

## 2017-09-21 DIAGNOSIS — R69 Illness, unspecified: Secondary | ICD-10-CM

## 2017-09-21 DIAGNOSIS — J111 Influenza due to unidentified influenza virus with other respiratory manifestations: Secondary | ICD-10-CM | POA: Insufficient documentation

## 2017-09-21 DIAGNOSIS — F17210 Nicotine dependence, cigarettes, uncomplicated: Secondary | ICD-10-CM | POA: Insufficient documentation

## 2017-09-21 LAB — RAPID STREP SCREEN (MED CTR MEBANE ONLY): Streptococcus, Group A Screen (Direct): NEGATIVE

## 2017-09-21 MED ORDER — IBUPROFEN 800 MG PO TABS
800.0000 mg | ORAL_TABLET | Freq: Once | ORAL | Status: AC
  Administered 2017-09-21: 800 mg via ORAL
  Filled 2017-09-21: qty 1

## 2017-09-21 MED ORDER — IBUPROFEN 600 MG PO TABS: 600.0000 mg | ORAL_TABLET | Freq: Three times a day (TID) | ORAL | 0 refills | Status: DC

## 2017-09-21 NOTE — ED Provider Notes (Signed)
[facility] EMERGENCY DEPARTMENT Provider Note   CSN: [id number] Arrival date & time: 09/21/17  [id number]     History   Chief Complaint Chief Complaint  Patient presents with  . Generalized Body Aches    HPI Cynthia Odonnell is a 32 y.o. female.  HPI  Patient is a 32 year old female presenting with flulike symptoms.  Patient has body aches fever sore throat coughing.  The symptoms and going on since last Wednesday, got worse on Friday.  Patient's been taking Tylenol has not taken any ibuprofen.  Has had some mild nausea.  Past Medical History:  Diagnosis Date  . Abnormal Pap smear of cervix   . Anxiety   . Arthritis   . Bronchitis    Hx - uses albuterol inhaler prn  . Cocaine abuse, in remission (HCC)   . Depression   . GERD (gastroesophageal reflux disease)    zantac prn  . Headache(784.0)    otc prn  . Optic neuritis    left eye  . Smoker   . Termination of pregnancy (fetus)    x 2    Patient Active Problem List   Diagnosis Date Noted  . H/O cesarean section 11/04/2014  . [redacted] weeks gestation of pregnancy   . Maternal morbid obesity in third trimester, antepartum (HCC)   . Excessive weight gain in pregnancy in second trimester   . [redacted] weeks gestation of pregnancy   . Smoking   . Poor fetal growth affecting management of mother in third trimester, antepartum   . [redacted] weeks gestation of pregnancy   . Maternal morbid obesity in second trimester, antepartum (HCC)   . [redacted] weeks gestation of pregnancy   . Evaluate anatomy not seen on prior sonogram   . Tobacco smoking affecting pregnancy in second trimester, antepartum   . Obesity affecting pregnancy in second trimester   . Uterine scar from previous cesarean delivery, antepartum complication   . Encounter for fetal anatomic survey   . [redacted] weeks gestation of pregnancy     Past Surgical History:  Procedure Laterality Date  . CESAREAN SECTION  07/31/2008   [facility]  . CESAREAN SECTION N/A 11/04/2014   Procedure:  CESAREAN SECTION;  Surgeon: Tracey Harrieshomas Henley, MD;  Location: [facility];  Service: Obstetrics;  Laterality: N/A;  . COLPOSCOPY    . WISDOM TOOTH EXTRACTION      OB History    Gravida Para Term Preterm AB Living   4 2 2   2 2    SAB TAB Ectopic Multiple Live Births     2   0 2       Home Medications    Prior to Admission medications   Not on File    Family History Family History  Problem Relation Age of Onset  . Other Neg Hx     Social History Social History   Tobacco Use  . Smoking status: Current Every Day Smoker    Packs/day: 1.00    Years: 16.00    Pack years: 16.00    Types: Cigarettes  . Smokeless tobacco: Never Used  Substance Use Topics  . Alcohol use: No  . Drug use: Yes    Frequency: 3.0 times per week    Types: Cocaine    Comment: states "usually 2 grams at at time"     Allergies   Amoxicillin; Codeine; Penicillins; Prednisone; Augmentin [amoxicillin-pot clavulanate]; Lamictal [lamotrigine]; and Tramadol   Review of Systems Review of Systems  Constitutional: Positive for fatigue  and fever. Negative for activity change.  HENT: Positive for congestion and sore throat.   Respiratory: Positive for cough and shortness of breath.   Cardiovascular: Negative for chest pain.  Gastrointestinal: Positive for nausea. Negative for abdominal pain.  All other systems reviewed and are negative.    Physical Exam Updated Vital Signs BP 138/84   Pulse 77   Temp 98.9 F (37.2 C) (Oral)   Resp 18   Ht 5\' 6"  (1.676 m)   Wt 99.8 kg (220 lb)   LMP 09/21/2017   SpO2 97%   BMI 35.51 kg/m   Physical Exam  Constitutional: She is oriented to person, place, and time. She appears well-developed and well-nourished.  HENT:  Head: Normocephalic and atraumatic.  Eyes: EOM are normal. Pupils are equal, round, and reactive to light. Right eye exhibits no discharge. Left eye exhibits no discharge.  Cardiovascular: Normal rate.  Pulmonary/Chest: Effort normal. No stridor.  No respiratory distress.  R base, expiratory small wheeze  Abdominal: Soft.  Neurological: She is oriented to person, place, and time.  Skin: Skin is warm and dry. She is not diaphoretic.  Psychiatric: She has a normal mood and affect.  Nursing note and vitals reviewed.    ED Treatments / Results  Labs (all labs ordered are listed, but only abnormal results are displayed) Labs Reviewed  RAPID STREP SCREEN (NOT AT [facility])    EKG  EKG Interpretation None       Radiology No results found.  Procedures Procedures (including critical care time)  Medications Ordered in ED Medications  ibuprofen (ADVIL,MOTRIN) tablet 800 mg (not administered)     Initial Impression / Assessment and Plan / ED Course  I have reviewed the triage vital signs and the nursing notes.  Pertinent labs & imaging results that were available during my care of the patient were reviewed by me and considered in my medical decision making (see chart for details).     Patient is a 32 year old female presenting with flulike symptoms.  Patient has body aches fever sore throat coughing.  The symptoms and going on since last Wednesday, got worse on Friday.  Patient's been taking Tylenol has not taken any ibuprofen.  Has had some mild nausea.  8:53 AM Patient appears to have flu.  Will get chest x-ray and rapid strep to rule out infectious causes of symptoms.  Final Clinical Impressions(s) / ED Diagnoses   Final diagnoses:  None    ED Discharge Orders    None       Mackuen, Cindee Salt, MD 09/21/17 1534

## 2017-09-21 NOTE — ED Triage Notes (Addendum)
Patient reports "I have the flu".  Reports generalized body aches, nausea, vomiting, fever, sore throat, coughing since Friday.

## 2017-09-21 NOTE — Discharge Instructions (Signed)
Your chest x-ray did not show any pneumonia.  We think you likely have a flu.  Please use Tylenol and ibuprofen to help with your symptoms.  Please stay at home, wash your hands, and stay hydrated.

## 2017-09-23 LAB — CULTURE, GROUP A STREP (THRC)

## 2018-03-04 ENCOUNTER — Ambulatory Visit (HOSPITAL_COMMUNITY)
Admission: RE | Admit: 2018-03-04 | Discharge: 2018-03-04 | Disposition: A | Discharge status: Home / Self Care | Payer: Self-pay | Attending: Psychiatry | Admitting: Psychiatry

## 2018-03-04 ENCOUNTER — Other Ambulatory Visit: Payer: Self-pay

## 2018-03-04 ENCOUNTER — Emergency Department (HOSPITAL_COMMUNITY)
Admission: EM | Admit: 2018-03-04 | Discharge: 2018-03-05 | Disposition: A | Discharge status: Other Acute Inpatient Hospital | Payer: Medicaid Other | Attending: Emergency Medicine | Admitting: Emergency Medicine

## 2018-03-04 DIAGNOSIS — I959 Hypotension, unspecified: Secondary | ICD-10-CM | POA: Insufficient documentation

## 2018-03-04 DIAGNOSIS — T1491XA Suicide attempt, initial encounter: Secondary | ICD-10-CM

## 2018-03-04 DIAGNOSIS — Z9114 Patient's other noncompliance with medication regimen: Secondary | ICD-10-CM | POA: Insufficient documentation

## 2018-03-04 DIAGNOSIS — Z7289 Other problems related to lifestyle: Secondary | ICD-10-CM | POA: Insufficient documentation

## 2018-03-04 DIAGNOSIS — F319 Bipolar disorder, unspecified: Secondary | ICD-10-CM | POA: Insufficient documentation

## 2018-03-04 DIAGNOSIS — F1410 Cocaine abuse, uncomplicated: Secondary | ICD-10-CM | POA: Insufficient documentation

## 2018-03-04 DIAGNOSIS — R45851 Suicidal ideations: Secondary | ICD-10-CM | POA: Insufficient documentation

## 2018-03-04 DIAGNOSIS — F329 Major depressive disorder, single episode, unspecified: Secondary | ICD-10-CM | POA: Insufficient documentation

## 2018-03-04 DIAGNOSIS — T428X2A Poisoning by antiparkinsonism drugs and other central muscle-tone depressants, intentional self-harm, initial encounter: Secondary | ICD-10-CM | POA: Insufficient documentation

## 2018-03-04 DIAGNOSIS — F410 Panic disorder [episodic paroxysmal anxiety] without agoraphobia: Secondary | ICD-10-CM | POA: Insufficient documentation

## 2018-03-04 DIAGNOSIS — R001 Bradycardia, unspecified: Secondary | ICD-10-CM | POA: Insufficient documentation

## 2018-03-04 DIAGNOSIS — Z046 Encounter for general psychiatric examination, requested by authority: Secondary | ICD-10-CM | POA: Insufficient documentation

## 2018-03-04 DIAGNOSIS — F17210 Nicotine dependence, cigarettes, uncomplicated: Secondary | ICD-10-CM | POA: Insufficient documentation

## 2018-03-04 LAB — COMPREHENSIVE METABOLIC PANEL
ALT: 13 U/L (ref 0–44)
AST: 14 U/L — ABNORMAL LOW (ref 15–41)
Albumin: 3.3 g/dL — ABNORMAL LOW (ref 3.5–5.0)
Alkaline Phosphatase: 59 U/L (ref 38–126)
Anion gap: 5 (ref 5–15)
BUN: 7 mg/dL (ref 6–20)
CO2: 24 mmol/L (ref 22–32)
Calcium: 8.6 mg/dL — ABNORMAL LOW (ref 8.9–10.3)
Chloride: 108 mmol/L (ref 98–111)
Creatinine, Ser: 0.84 mg/dL (ref 0.44–1.00)
GFR calc Af Amer: >60 mL/min (ref >60)
GFR calc non Af Amer: >60 mL/min (ref >60)
Glucose, Bld: 110 mg/dL — ABNORMAL HIGH (ref 70–99)
Potassium: 4.1 mmol/L (ref 3.5–5.1)
Sodium: 137 mmol/L (ref 135–145)
Total Bilirubin: 0.5 mg/dL (ref 0.3–1.2)
Total Protein: 6.2 g/dL — ABNORMAL LOW (ref 6.5–8.1)

## 2018-03-04 LAB — CBC
HCT: 38.6 % (ref 36.0–46.0)
HEMOGLOBIN: 12.9 g/dL (ref 12.0–15.0)
MCH: 27 pg (ref 26.0–34.0)
MCHC: 33.4 g/dL (ref 30.0–36.0)
MCV: 80.9 fL (ref 78.0–100.0)
Platelets: 267 10*3/uL (ref 150–400)
RBC: 4.77 MIL/uL (ref 3.87–5.11)
RDW: 17.7 % — AB (ref 11.5–15.5)
WBC: 7.7 10*3/uL (ref 4.0–10.5)

## 2018-03-04 LAB — RAPID URINE DRUG SCREEN, HOSP PERFORMED
Amphetamines: NOT DETECTED
BENZODIAZEPINES: NOT DETECTED
Barbiturates: NOT DETECTED
COCAINE: POSITIVE — AB
Opiates: NOT DETECTED
TETRAHYDROCANNABINOL: POSITIVE — AB

## 2018-03-04 LAB — ACETAMINOPHEN LEVEL

## 2018-03-04 LAB — PREGNANCY, URINE: PREG TEST UR: NEGATIVE

## 2018-03-04 LAB — SALICYLATE LEVEL: Salicylate Lvl: <7 mg/dL (ref 2.8–30.0)

## 2018-03-04 LAB — ETHANOL: Alcohol, Ethyl (B): <10 mg/dL (ref <10)

## 2018-03-04 MED ORDER — SODIUM CHLORIDE 0.9 % IV SOLN
INTRAVENOUS | Status: DC
  Administered 2018-03-05: via INTRAVENOUS

## 2018-03-04 MED ORDER — SODIUM CHLORIDE 0.9 % IV SOLN
Freq: Once | INTRAVENOUS | Status: AC
  Administered 2018-03-04: 21:00:00 via INTRAVENOUS

## 2018-03-04 MED ORDER — SODIUM CHLORIDE 0.9 % IV BOLUS
1000.0000 mL | Freq: Once | INTRAVENOUS | Status: AC
  Administered 2018-03-04: 1000 mL via INTRAVENOUS

## 2018-03-04 NOTE — ED Notes (Signed)
Dr. Effie ShyWentz made aware of SB with HR 40 and SBP 84-87-aware of Poison Control recommendations/repeat EKG given to Dr. Effie ShyWentz
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
## 2018-03-04 NOTE — ED Notes (Signed)
Lab called to get U preg completed

## 2018-03-04 NOTE — ED Notes (Addendum)
Pt states she thought that cocaine would counterbalance the medication she took

## 2018-03-04 NOTE — ED Notes (Signed)
Spoke with Poison Control:- -Tizanidine-Mother's RX 2 mg tabs -causes bradycardia-treat with atropine as needed -hypotension -CNS depression -hydrate patient -8 hour observation

## 2018-03-04 NOTE — H&P (Signed)
Behavioral Health Medical Screening Exam  Cynthia Odonnell is an 32 y.o. female who [facility] depression and suicidal ideations. During TTS assessment patient reported that she overdosed on 30-40 tizanidine tablets about [facility].    Recommendations:  [facility]  Jackelyn PolingJason A Kaileen Bronkema, NP 03/04/2018, 8:50 PM

## 2018-03-04 NOTE — BH Assessment (Signed)
[facility] Aurther Lofterry to let her know the pt was arriving to the ED due to an OD on Tizanidine.  The pt stated she took the medication 30 minutes prior.  Aurther Lofterry said to [facility] and have the person be transferred there.  [place] CN for [place], Vernard Gamblesmilee was made aware.

## 2018-03-04 NOTE — ED Notes (Signed)
Patient's belongings are in bag with name on bag- 16-18/Res A
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
## 2018-03-04 NOTE — ED Provider Notes (Addendum)
[facility]-EMERGENCY DEPT Provider Note   CSN: [id number] Arrival date & time: 03/04/18  2026     History   Chief Complaint Chief Complaint  Patient presents with  . Drug Overdose  . Medical Clearance    HPI Cynthia Odonnell is a 32 y.o. female.  HPI   She presents for evaluation of depression, cocaine abuse, overdose on medication.  She went to behavioral health today because she has stopped over using cocaine, and feeling that "I do not want to live like this."  To help with this feeling she is using medication appropriately, tizanidine 2 mg tablets taking 10-12 at a time for 5 times over the last 24 hours.  She also has been coming on her left wrist because that helps to avoid the pain she is feeling.  She is off her medications for psychiatric illness for a year and a half.  She lives with her husband who is also a user.  She uses cocaine multiple times daily and heroin occasionally.  She denies fever, chills, vomiting, cough, chest pain, focal weakness or paresthesia.  There are no other no modifying factors.  Past Medical History:  Diagnosis Date  . Abnormal Pap smear of cervix   . Anxiety   . Arthritis   . Bronchitis    Hx - uses albuterol inhaler prn  . Cocaine abuse, in remission (HCC)   . Depression   . GERD (gastroesophageal reflux disease)    zantac prn  . Headache(784.0)    otc prn  . Optic neuritis    left eye  . Smoker   . Termination of pregnancy (fetus)    x 2    Patient Active Problem List   Diagnosis Date Noted  . H/O cesarean section 11/04/2014  . [redacted] weeks gestation of pregnancy   . Maternal morbid obesity in third trimester, antepartum (HCC)   . Excessive weight gain in pregnancy in second trimester   . [redacted] weeks gestation of pregnancy   . Smoking   . Poor fetal growth affecting management of mother in third trimester, antepartum   . [redacted] weeks gestation of pregnancy   . Maternal morbid obesity in second trimester, antepartum  (HCC)   . [redacted] weeks gestation of pregnancy   . Evaluate anatomy not seen on prior sonogram   . Tobacco smoking affecting pregnancy in second trimester, antepartum   . Obesity affecting pregnancy in second trimester   . Uterine scar from previous cesarean delivery, antepartum complication   . Encounter for fetal anatomic survey   . [redacted] weeks gestation of pregnancy     Past Surgical History:  Procedure Laterality Date  . CESAREAN SECTION  07/31/2008   [facility]  . CESAREAN SECTION N/A 11/04/2014   Procedure: CESAREAN SECTION;  Surgeon: Tracey Harries, MD;  Location: [facility];  Service: Obstetrics;  Laterality: N/A;  . COLPOSCOPY    . WISDOM TOOTH EXTRACTION       OB History    Gravida  4   Para  2   Term  2   Preterm      AB  2   Living  2     SAB      TAB  2   Ectopic      Multiple  0   Live Births  2            Home Medications    Prior to Admission medications   Medication Sig Start Date End Date  Taking? Authorizing Provider  ibuprofen (ADVIL,MOTRIN) 600 MG tablet Take 1 tablet (600 mg total) by mouth 3 (three) times daily. 09/21/17   Mackuen, Cindee Saltourteney Lyn, MD    Family History Family History  Problem Relation Age of Onset  . Other Neg Hx     Social History Social History   Tobacco Use  . Smoking status: Current Every Day Smoker    Packs/day: 1.00    Years: 16.00    Pack years: 16.00    Types: Cigarettes  . Smokeless tobacco: Never Used  Substance Use Topics  . Alcohol use: No  . Drug use: Yes    Frequency: 3.0 times per week    Types: Cocaine    Comment: states "usually 2 grams at at time"     Allergies   Amoxicillin; Codeine; Penicillins; Prednisone; Augmentin [amoxicillin-pot clavulanate]; Lamictal [lamotrigine]; and Tramadol   Review of Systems Review of Systems  All other systems reviewed and are negative.    Physical Exam Updated Vital Signs BP 103/75 (BP Location: Right Arm)   Pulse (!) 44   Temp 98.1 F (36.7 C) (Oral)    Resp 12   SpO2 100%   Physical Exam  Constitutional: She is oriented to person, place, and time. She appears well-developed. She appears distressed (Uncomfortable).  Overweight  HENT:  Head: Normocephalic and atraumatic.  Eyes: Pupils are equal, round, and reactive to light. Conjunctivae and EOM are normal.  Neck: Normal range of motion and phonation normal. Neck supple.  Cardiovascular: Normal rate and regular rhythm.  Pulmonary/Chest: Effort normal and breath sounds normal. She exhibits no tenderness.  Abdominal: Soft. She exhibits no distension. There is no tenderness. There is no guarding.  Musculoskeletal: Normal range of motion. She exhibits no deformity.  Neurological: She is alert and oriented to person, place, and time. She exhibits normal muscle tone.  No dysarthria, aphasia or nystagmus.  Skin: Skin is warm and dry.  Superficial longitudinal linear abrasions of the left volar wrist.  These are not bleeding.  Psychiatric: Her behavior is normal.  Sad, tearful  Nursing note and vitals reviewed.    ED Treatments / Results  Labs (all labs ordered are listed, but only abnormal results are displayed) Labs Reviewed  COMPREHENSIVE METABOLIC PANEL - Abnormal; Notable for the following components:      Result Value   Glucose, Bld 110 (*)    Calcium 8.6 (*)    Total Protein 6.2 (*)    Albumin 3.3 (*)    AST 14 (*)    All other components within normal limits  ACETAMINOPHEN LEVEL - Abnormal; Notable for the following components:   Acetaminophen (Tylenol), Serum <10 (*)    All other components within normal limits  CBC - Abnormal; Notable for the following components:   RDW 17.7 (*)    All other components within normal limits  RAPID URINE DRUG SCREEN, HOSP PERFORMED - Abnormal; Notable for the following components:   Cocaine POSITIVE (*)    Tetrahydrocannabinol POSITIVE (*)    All other components within normal limits  ETHANOL  SALICYLATE LEVEL  PREGNANCY, URINE     EKG EKG Interpretation  Date/Time:  Thursday March 04 2018 21:58:41 EDT Ventricular Rate:  44 PR Interval:    QRS Duration: 88 QT Interval:  478 QTC Calculation: 409 R Axis:   86 Text Interpretation:  Sinus bradycardia Short PR interval Since last tracing rate slower Confirmed by Mancel BaleWentz, Joakim Huesman [phone]([id number]) on 03/04/2018 10:55:03 PM   Radiology No results found.  Procedures .Critical Care Performed by: Mancel Bale, MD Authorized by: Mancel Bale, MD   Critical care provider statement:    Critical care time (minutes):  50   Critical care start time:  03/04/2018 9:12 PM   Critical care end time:  03/04/2018 10:29 PM   Critical care time was exclusive of:  Separately billable procedures and treating other patients   Critical care was necessary to treat or prevent imminent or life-threatening deterioration of the following conditions:  Toxidrome   Critical care was time spent personally by me on the following activities:  Blood draw for specimens, development of treatment plan with patient or surrogate, discussions with consultants, evaluation of patient's response to treatment, examination of patient, obtaining history from patient or surrogate, ordering and performing treatments and interventions, ordering and review of laboratory studies, pulse oximetry, re-evaluation of patient's condition, review of old charts and ordering and review of radiographic studies   (including critical care time)  Medications Ordered in ED Medications  sodium chloride 0.9 % bolus 1,000 mL (has no administration in time range)  0.9 %  sodium chloride infusion ( Intravenous New Bag/Given 03/04/18 2126)     Initial Impression / Assessment and Plan / ED Course  I have reviewed the triage vital signs and the nursing notes.  Pertinent labs & imaging results that were available during my care of the patient were reviewed by me and considered in my medical decision making (see chart for  details).  Clinical Course as of Mar 05 2355  Thu Mar 04, 2018  2354 At this time, on saline bolus blood pressure 103/75.   [EW]    Clinical Course User Index [EW] Mancel Bale, MD     Patient Vitals for the past 24 hrs:  BP Temp Temp src Pulse Resp SpO2  03/04/18 2345 103/75 - - (!) 44 12 100 %  03/04/18 2245 (!) 75/41 - - - 18 97 %  03/04/18 2230 (!) 75/50 - - - 18 99 %  03/04/18 2215 (!) 85/47 - - - (!) 26 98 %  03/04/18 2200 (!) 87/58 - - - 16 100 %  03/04/18 2145 (!) 87/63 - - - 15 100 %  03/04/18 2130 102/73 - - - 14 100 %  03/04/18 2039 106/88 98.1 F (36.7 C) Oral (!) 50 14 92 %    10:26 PM Reevaluation with update and discussion. After initial assessment and treatment, an updated evaluation reveals she is alert and conversant.  She is now hypotensive and bradycardic, she has been treated with IV saline bolus.  She understands that she will require observation for 8 hours then psychiatric evaluation. Mancel Bale   11:35 PM-blood pressure is diminished somewhat.  Patient remains alert and cooperative.  We will give additional IV fluids, and reassess.  Patient is hungry and thirsty have asked nursing to let her eat.  Medical Decision Making: Suicide attempt, by inappropriately using tizanidine, with ongoing cocaine abuse.  Patient is noncompliant with her psychiatric treatment for bipolar disorder.  Patient requiring observation for hemodynamic compromise, from tizanidine overdose.  She will likely clear with symptomatic treatment and supportive care.  CRITICAL CARE-yes Performed by: Mancel Bale   Nursing Notes Reviewed/ Care Coordinated Applicable Imaging Reviewed Interpretation of Laboratory Data incorporated into ED treatment   Plan-ongoing management by oncoming provider team, then TTS consultation    Final Clinical Impressions(s) / ED Diagnoses   Final diagnoses:  Suicide attempt [facility])  Cocaine abuse (HCC)  Bipolar affective disorder, remission  status  unspecified (HCC)  Hypotension, unspecified hypotension type  Bradycardia    ED Discharge Orders    None           Mancel Bale, MD 03/04/18 2356

## 2018-03-05 ENCOUNTER — Inpatient Hospital Stay (HOSPITAL_COMMUNITY)
Admission: AD | Admit: 2018-03-05 | Discharge: 2018-03-12 | DRG: 897 | Disposition: A | Discharge status: Home / Self Care | Payer: MEDICAID | Source: Intra-hospital | Attending: Psychiatry | Admitting: Psychiatry

## 2018-03-05 ENCOUNTER — Other Ambulatory Visit: Payer: Self-pay

## 2018-03-05 ENCOUNTER — Encounter (HOSPITAL_COMMUNITY): Payer: Self-pay

## 2018-03-05 DIAGNOSIS — F259 Schizoaffective disorder, unspecified: Secondary | ICD-10-CM | POA: Diagnosis present

## 2018-03-05 DIAGNOSIS — Z811 Family history of alcohol abuse and dependence: Secondary | ICD-10-CM

## 2018-03-05 DIAGNOSIS — K219 Gastro-esophageal reflux disease without esophagitis: Secondary | ICD-10-CM | POA: Diagnosis present

## 2018-03-05 DIAGNOSIS — F17210 Nicotine dependence, cigarettes, uncomplicated: Secondary | ICD-10-CM | POA: Diagnosis present

## 2018-03-05 DIAGNOSIS — F1490 Cocaine use, unspecified, uncomplicated: Secondary | ICD-10-CM | POA: Diagnosis not present

## 2018-03-05 DIAGNOSIS — F909 Attention-deficit hyperactivity disorder, unspecified type: Secondary | ICD-10-CM | POA: Diagnosis present

## 2018-03-05 DIAGNOSIS — F1420 Cocaine dependence, uncomplicated: Secondary | ICD-10-CM | POA: Diagnosis present

## 2018-03-05 DIAGNOSIS — F319 Bipolar disorder, unspecified: Secondary | ICD-10-CM | POA: Diagnosis present

## 2018-03-05 DIAGNOSIS — F14120 Cocaine abuse with intoxication, uncomplicated: Secondary | ICD-10-CM | POA: Insufficient documentation

## 2018-03-05 DIAGNOSIS — Z885 Allergy status to narcotic agent status: Secondary | ICD-10-CM

## 2018-03-05 DIAGNOSIS — Y92230 Patient room in hospital as the place of occurrence of the external cause: Secondary | ICD-10-CM | POA: Diagnosis not present

## 2018-03-05 DIAGNOSIS — F3163 Bipolar disorder, current episode mixed, severe, without psychotic features: Secondary | ICD-10-CM | POA: Diagnosis present

## 2018-03-05 DIAGNOSIS — R5383 Other fatigue: Secondary | ICD-10-CM | POA: Diagnosis not present

## 2018-03-05 DIAGNOSIS — F1994 Other psychoactive substance use, unspecified with psychoactive substance-induced mood disorder: Secondary | ICD-10-CM | POA: Diagnosis present

## 2018-03-05 DIAGNOSIS — G4700 Insomnia, unspecified: Secondary | ICD-10-CM | POA: Diagnosis present

## 2018-03-05 DIAGNOSIS — Z793 Long term (current) use of hormonal contraceptives: Secondary | ICD-10-CM

## 2018-03-05 DIAGNOSIS — I959 Hypotension, unspecified: Secondary | ICD-10-CM

## 2018-03-05 DIAGNOSIS — Z881 Allergy status to other antibiotic agents status: Secondary | ICD-10-CM | POA: Diagnosis not present

## 2018-03-05 DIAGNOSIS — Z98891 History of uterine scar from previous surgery: Secondary | ICD-10-CM

## 2018-03-05 DIAGNOSIS — Z888 Allergy status to other drugs, medicaments and biological substances status: Secondary | ICD-10-CM

## 2018-03-05 DIAGNOSIS — F1410 Cocaine abuse, uncomplicated: Secondary | ICD-10-CM

## 2018-03-05 DIAGNOSIS — F1290 Cannabis use, unspecified, uncomplicated: Secondary | ICD-10-CM | POA: Diagnosis present

## 2018-03-05 DIAGNOSIS — F419 Anxiety disorder, unspecified: Secondary | ICD-10-CM | POA: Diagnosis present

## 2018-03-05 DIAGNOSIS — R001 Bradycardia, unspecified: Secondary | ICD-10-CM

## 2018-03-05 DIAGNOSIS — T424X5A Adverse effect of benzodiazepines, initial encounter: Secondary | ICD-10-CM | POA: Diagnosis not present

## 2018-03-05 DIAGNOSIS — R45851 Suicidal ideations: Secondary | ICD-10-CM | POA: Diagnosis present

## 2018-03-05 DIAGNOSIS — M1990 Unspecified osteoarthritis, unspecified site: Secondary | ICD-10-CM | POA: Diagnosis present

## 2018-03-05 DIAGNOSIS — F10239 Alcohol dependence with withdrawal, unspecified: Secondary | ICD-10-CM | POA: Diagnosis present

## 2018-03-05 DIAGNOSIS — Z91040 Latex allergy status: Secondary | ICD-10-CM | POA: Diagnosis not present

## 2018-03-05 DIAGNOSIS — T1491XA Suicide attempt, initial encounter: Secondary | ICD-10-CM | POA: Insufficient documentation

## 2018-03-05 DIAGNOSIS — Z818 Family history of other mental and behavioral disorders: Secondary | ICD-10-CM | POA: Diagnosis not present

## 2018-03-05 MED ORDER — GABAPENTIN 300 MG PO CAPS: 300.0000 mg | ORAL_CAPSULE | Freq: Three times a day (TID) | ORAL | Status: DC

## 2018-03-05 MED ORDER — NAPROXEN 500 MG PO TABS
500.0000 mg | ORAL_TABLET | Freq: Two times a day (BID) | ORAL | Status: DC | PRN
  Administered 2018-03-05: 500 mg via ORAL
  Filled 2018-03-05: qty 1

## 2018-03-05 MED ORDER — ALUM & MAG HYDROXIDE-SIMETH 200-200-20 MG/5ML PO SUSP: 30.0000 mL | ORAL | Status: DC | PRN

## 2018-03-05 MED ORDER — LORAZEPAM 2 MG/ML IJ SOLN: 1.0000 mg | Freq: Once | INTRAMUSCULAR | Status: DC | PRN

## 2018-03-05 MED ORDER — NICOTINE 21 MG/24HR TD PT24
21.0000 mg | MEDICATED_PATCH | Freq: Every day | TRANSDERMAL | Status: DC
  Administered 2018-03-06 – 2018-03-10 (×5): 21 mg via TRANSDERMAL
  Filled 2018-03-05 (×9): qty 1

## 2018-03-05 MED ORDER — ONDANSETRON HCL 4 MG PO TABS: 4.0000 mg | ORAL_TABLET | Freq: Three times a day (TID) | ORAL | Status: DC | PRN

## 2018-03-05 MED ORDER — ACETAMINOPHEN 325 MG PO TABS
650.0000 mg | ORAL_TABLET | Freq: Four times a day (QID) | ORAL | Status: DC | PRN
  Administered 2018-03-07 (×2): 650 mg via ORAL
  Filled 2018-03-05 (×2): qty 2

## 2018-03-05 MED ORDER — GABAPENTIN 300 MG PO CAPS
300.0000 mg | ORAL_CAPSULE | Freq: Three times a day (TID) | ORAL | Status: DC
  Administered 2018-03-05 – 2018-03-10 (×14): 300 mg via ORAL
  Filled 2018-03-05 (×18): qty 1

## 2018-03-05 MED ORDER — HYDROXYZINE HCL 25 MG PO TABS
25.0000 mg | ORAL_TABLET | Freq: Three times a day (TID) | ORAL | Status: DC | PRN
  Administered 2018-03-05 – 2018-03-06 (×2): 25 mg via ORAL
  Filled 2018-03-05 (×3): qty 1

## 2018-03-05 MED ORDER — NAPROXEN 500 MG PO TABS
500.0000 mg | ORAL_TABLET | Freq: Two times a day (BID) | ORAL | Status: DC | PRN
  Administered 2018-03-06: 500 mg via ORAL
  Filled 2018-03-05: qty 1

## 2018-03-05 MED ORDER — ACETAMINOPHEN 325 MG PO TABS: 650.0000 mg | ORAL_TABLET | ORAL | Status: DC | PRN

## 2018-03-05 MED ORDER — NICOTINE 21 MG/24HR TD PT24
21.0000 mg | MEDICATED_PATCH | Freq: Every day | TRANSDERMAL | Status: DC
  Administered 2018-03-05 (×2): 21 mg via TRANSDERMAL

## 2018-03-05 MED ORDER — LORAZEPAM 1 MG PO TABS
1.0000 mg | ORAL_TABLET | Freq: Once | ORAL | Status: AC
  Administered 2018-03-05: 1 mg via ORAL
  Filled 2018-03-05: qty 1

## 2018-03-05 MED ORDER — TRAZODONE HCL 50 MG PO TABS
50.0000 mg | ORAL_TABLET | Freq: Every evening | ORAL | Status: DC | PRN
  Administered 2018-03-06 – 2018-03-11 (×7): 50 mg via ORAL
  Filled 2018-03-05 (×7): qty 1

## 2018-03-05 MED ORDER — MAGNESIUM HYDROXIDE 400 MG/5ML PO SUSP
30.0000 mL | Freq: Every day | ORAL | Status: DC | PRN
Start: 2018-03-05 — End: 2018-03-12
  Administered 2018-03-08 – 2018-03-10 (×3): 30 mL via ORAL
  Filled 2018-03-05 (×3): qty 30

## 2018-03-05 MED ORDER — NICOTINE 21 MG/24HR TD PT24
21.0000 mg | MEDICATED_PATCH | Freq: Once | TRANSDERMAL | Status: DC
Start: 2018-03-05 — End: 2018-03-05
  Administered 2018-03-05: 21 mg via TRANSDERMAL
  Filled 2018-03-05: qty 1

## 2018-03-05 MED ORDER — ZOLPIDEM TARTRATE 5 MG PO TABS: 5.0000 mg | ORAL_TABLET | Freq: Every evening | ORAL | Status: DC | PRN

## 2018-03-05 NOTE — ED Notes (Addendum)
Report called to Engineer, maintenanceAlissa [facility].

## 2018-03-05 NOTE — [facility] Group Notes (Signed)
Adult Psychoeducational Group Note  Date:  03/05/2018  Time: 4:00 PM  Group Topic/Focus: Music as a Coping Skill Patients choose a song of significance to and explain to the group how the song has helped with their recovery.  Participation Level:  Active  Participation Quality:  Appropriate and Attentive  Affect:  Appropriate  Cognitive:  Alert and Oriented  Insight: Improving  Engagement in Group:  Developing/Improving  Modes of Intervention:  Discussion, Activity, and Education  Additional Comments:  Patient was attentive in group, supported of peers and contributed to the discussion.  Cynthia Odonnell A Cynthia Odonnell 03/05/2018 5:00 PM  

## 2018-03-05 NOTE — ED Notes (Signed)
[facility]

## 2018-03-05 NOTE — ED Notes (Signed)
Patient visiting with mother. 

## 2018-03-05 NOTE — ED Provider Notes (Signed)
[facility]     Cathren LaineSteinl, Abdoulie Tierce, MD 03/05/18 [phone]

## 2018-03-05 NOTE — Patient Outreach (Signed)
ED Peer Support Specialist Patient Intake (Complete at intake & 30-60 Day Follow-up)  Name: Cynthia Odonnell  MRN: [id number]  Age: 32 y.o.   Date of Admission: 03/05/2018  Intake: Initial Comments:      Primary Reason Admitted: The pt stated she came in so she can get back on medication for her schizoaffective disorder and to get off of cocaine.  The pt stated she tried to slit her wrist a week ago.  She denies SI currently.  She later stated she took 40 2 mg pills of Tizanidine 30 minutes prior to coming to behavioral health.  She denies this was a suicide attempt.  The pt stated she is using about 1 mg of cocaine a day with her last usage being yesterday.  The pt is also using alcohol daily and drinks about 6 12 oz beers a day.  She last had some alcohol yesterday.      POH: Negative Positive UDS? Yes Amphetamines: No Barbiturates: No Benzodiazepines: No Cocaine: Yes Opiates: No Cannabinoids: Yes  Demographic information: Gender: Female Ethnicity: White Marital Status: Married Insurance underwriter Status: Uninsured/Self-pay Ecologist (Work Neurosurgeon, Physicist, medical, etc.: Yes(Food stamps ) Lives with: Partner/Spouse Living situation: House/Apartment  Reported Patient History: Patient reported health conditions: Depression, Schizoaffective disorder, Anxiety disorders Patient aware of HIV and hepatitis status: No  In past year, has patient visited ED for any reason? Yes  Number of ED visits: 2  Reason(s) for visit: Flu  In past year, has patient been hospitalized for any reason? No  Number of hospitalizations:    Reason(s) for hospitalization:    In past year, has patient been arrested? No  Number of arrests:    Reason(s) for arrest:    In past year, has patient been incarcerated? No  Number of incarcerations:    Reason(s) for incarceration:    In past year, has patient received medication-assisted treatment? No  In past year, patient  received the following treatments: (just talk with members in [place])  In past year, has patient received any harm reduction services? No  Did this include any of the following?    In past year, has patient received care from a mental health provider for diagnosis other than SUD? No  In past year, is this first time patient has overdosed? No  Number of past overdoses:    In past year, is this first time patient has been hospitalized for an overdose? No  Number of hospitalizations for overdose(s):    Is patient currently receiving treatment for a mental health diagnosis? No  Patient reports experiencing difficulty participating in SUD treatment: No    Most important reason(s) for this difficulty?    Has patient received prior services for treatment? No  In past, patient has received services from following agencies:    Plan of Care:  Suggested follow up at these agencies/treatment centers: (Seeking Medications Management )  Other information: CPSS met with Pt and was able gain information to better assist Pt. CPSS was made aware that Pt was in a bad place mentally because of the substance and situations that has occurred in her life. CPSS also was informed that Pt wants to get stable with her medications and has been off for sometime now. CPSS was able to use [facility] to help process wiih Pt and to offer positive support. CPSS was made aware that Pt will be going across to [facility] and CPSS will follow up with Pt about continuing services from  there.     Aaron Edelman Deoni Cosey, CPSS  03/05/2018 12:49 PM

## 2018-03-05 NOTE — ED Notes (Signed)
Pt c/o blurred vision that quickly resolved no other symptoms reported

## 2018-03-05 NOTE — Tx Team (Signed)
Initial Treatment Plan 03/05/2018 5:01 PM Cynthia Odonnell YQM:[id number]    PATIENT STRESSORS: Marital or family conflict Substance abuse   PATIENT STRENGTHS: Ability for insight Average or above average intelligence Communication skills Motivation for treatment/growth Physical Health Supportive family/friends   PATIENT IDENTIFIED PROBLEMS:     "need to learn new coping skills for not using"    "want to express my feelings"             DISCHARGE CRITERIA:  Improved stabilization in mood, thinking, and/or behavior Verbal commitment to aftercare and medication compliance  PRELIMINARY DISCHARGE PLAN: Attend aftercare/continuing care group Return to previous living arrangement  PATIENT/FAMILY INVOLVEMENT: This treatment plan has been presented to and reviewed with the patient   The patient has been given the opportunity to ask questions and make suggestions.  Shela NevinValerie S Graviel Payeur, RN 03/05/2018, 5:01 PM

## 2018-03-05 NOTE — ED Provider Notes (Addendum)
  Physical Exam  BP 103/75 (BP Location: Right Arm)   Pulse (!) 44   Temp 98.1 F (36.7 C) (Oral)   Resp 12   SpO2 100%   Physical Exam  ED Course/Procedures   Clinical Course as of Mar 05 16  Thu Mar 04, 2018  2354 At this time, on saline bolus blood pressure 103/75.   [EW]    Clinical Course User Index [EW] Mancel BaleWentz, Elliott, MD    Procedures  MDM   Assuming care of patient from Dr. Effie ShyWentz.   Patient in the ED for overdose. Pt has taken 50 x 2mg  zanaflex. Pt was sequentially taking the medication over 24 hour period. Pt came her voluntarily.  Workup thus far shows bradycardia and low BP.  Concerning findings are as following hypotension  Important pending results are: none  According to Dr. Effie ShyWentz, plan is to monitor until 5 am, then she will be medically cleared.   Patient had no complains, no concerns from the nursing side. Will continue to monitor.   Derwood KaplanNanavati, Patterson Hollenbaugh, MD 03/05/18 0019   6:14 AM  EKG Interpretation  Date/Time:  Friday March 05 2018 04:55:12 EDT Ventricular Rate:  66 PR Interval:    QRS Duration: 86 QT Interval:  393 QTC Calculation: 412 R Axis:   73 Text Interpretation:  Sinus rhythm Short PR interval No acute changes bradycardia resolved Confirmed by Derwood KaplanNanavati, Adan Baehr [phone]([id number]) on 03/05/2018 6:14:44 AM       Patient is not medically cleared for psychiatric evaluation.        Derwood KaplanNanavati, [place] Moyd, MD 03/05/18 [phone]

## 2018-03-05 NOTE — BH Assessment (Signed)
[facility]  Per Juanetta BeetsJacqueline Norman, DO, this pt requires psychiatric hospitalization at this time.  Malva LimesLinsey Strader, RN, [facility] Rm [facility]; [facility] will be ready to receive pt at 14:00.  Pt has signed Voluntary Admission and Consent for Treatment, as well as Consent to Release Information to pt's mother and husband, and signed forms [facility].  Pt's nurse, Aram BeechamCynthia, has been notified, and agrees to send original paperwork along with pt via [facility], and to call report to [phone].  Doylene Canninghomas Alyn Riedinger, [place] Behavioral Health Coordinator [phone]

## 2018-03-05 NOTE — Consult Note (Addendum)
[facility] Consult   Reason for Consult:  Suicidal ideation Referring Physician:  EDP Patient Identification: Cynthia Odonnell MRN:  [id number] Principal Diagnosis: Bipolar affective disorder [facility]) Diagnosis:   Patient Active Problem List   Diagnosis Date Noted  . Bipolar affective disorder ([place]) [F31.9]   . Bradycardia [R00.1]   . Cocaine abuse ([place]) [F14.10]   . Hypotension [I95.9]   . Suicide attempt ([place]) [T14.91XA]   . H/O cesarean section [Z98.891] 11/04/2014  . [redacted] weeks gestation of pregnancy [Z3A.37]   . Maternal morbid obesity in third trimester, antepartum ([place]) [I14.431, E66.01]   . Excessive weight gain in pregnancy in second trimester [O26.02]   . [redacted] weeks gestation of pregnancy [Z3A.33]   . Smoking [F17.200]   . Poor fetal growth affecting management of mother in third trimester, antepartum [O36.5930]   . [redacted] weeks gestation of pregnancy [Z3A.30]   . Maternal morbid obesity in second trimester, antepartum ([place]) [V40.086, E66.01]   . [redacted] weeks gestation of pregnancy [Z3A.24]   . Evaluate anatomy not seen on prior sonogram [Z04.89]   . Tobacco smoking affecting pregnancy in second trimester, antepartum [O99.332]   . Obesity affecting pregnancy in second trimester [O99.212]   . Uterine scar from previous cesarean delivery, antepartum complication [P61.950]   . Encounter for fetal anatomic survey [Z36.89]   . [redacted] weeks gestation of pregnancy [Z3A.20]     Total Time spent with patient: 45 minutes  Subjective:   Cynthia Odonnell is a 32 y.o. female patient admitted with suicidal ideation.   HPI:  Pt was seen and chart reviewed with treatment team and Dr Mariea Clonts. Pt stated she has been using cocaine every other day since age 72, she uses to numb her feelings so she won't have to talk about them. Pt's UDS positive for cocaine, BAL negative. Pt stated her longest period of sobriety was 9 months and this was when she was pregnant. She lives with her husband and two  children ages 47 & 82. Pt was tearful when talking about not wanting to die because she loves her children and she wants to quit using drugs. Pt was taking Tinazidine 2 mg pills, 12 at a time in an effort to come down from cocaine so she wouldn't use anymore. Pt went to [facility] as a walk-in for help and was sent to [facility] for medical clearance. Pt's heart rate dropped once at the hospital due to the muscle relaxer's she took. Pt's EKG is within normal limits, lab work is reassuring. Urine pregnancy is negative. Pt would benefit from an inpatient psychiatric admission for crisis stabilization and medication management.   Past Psychiatric History: As above  Risk to Self:  Yes  Risk to Others:  No Prior Inpatient Therapy:  She was hospitalized at [facility] 2 years ago for suicide attempt.  Prior Outpatient Therapy:  She was previously seen at [facility] for medication management.   Past Medical History:  Past Medical History:  Diagnosis Date  . Abnormal Pap smear of cervix   . Anxiety   . Arthritis   . Bronchitis    Hx - uses albuterol inhaler prn  . Cocaine abuse, in remission ([place])   . Depression   . GERD (gastroesophageal reflux disease)    zantac prn  . Headache(784.0)    otc prn  . Optic neuritis    left eye  . Smoker   . Termination of pregnancy (fetus)    x 2    Past Surgical  History:  Procedure Laterality Date  . CESAREAN SECTION  07/31/2008   [place]  . CESAREAN SECTION N/A 11/04/2014   Procedure: CESAREAN SECTION;  Surgeon: Newton Pigg, MD;  Location: [facility];  Service: Obstetrics;  Laterality: N/A;  . COLPOSCOPY    . WISDOM TOOTH EXTRACTION     Family History:  Family History  Problem Relation Age of Onset  . Other Neg Hx    Family Psychiatric  History: Mother-drug abuse. Social History:  Social History   Substance and Sexual Activity  Alcohol Use No     Social History   Substance and Sexual Activity  Drug Use Yes  . Frequency: 3.0 times per week  . Types: Cocaine    Comment: states "usually 2 grams at at time"    Social History   Socioeconomic History  . Marital status: Married    Spouse name: Not on file  . Number of children: Not on file  . Years of education: Not on file  . Highest education level: Not on file  Occupational History  . Not on file  Social Needs  . Financial resource strain: Not on file  . Food insecurity:    Worry: Not on file    Inability: Not on file  . Transportation needs:    Medical: Not on file    Non-medical: Not on file  Tobacco Use  . Smoking status: Current Every Day Smoker    Packs/day: 1.00    Years: 16.00    Pack years: 16.00    Types: Cigarettes  . Smokeless tobacco: Never Used  Substance and Sexual Activity  . Alcohol use: No  . Drug use: Yes    Frequency: 3.0 times per week    Types: Cocaine    Comment: states "usually 2 grams at at time"  . Sexual activity: Not on file  Lifestyle  . Physical activity:    Days per week: Not on file    Minutes per session: Not on file  . Stress: Not on file  Relationships  . Social connections:    Talks on phone: Not on file    Gets together: Not on file    Attends religious service: Not on file    Active member of club or organization: Not on file    Attends meetings of clubs or organizations: Not on file    Relationship status: Not on file  Other Topics Concern  . Not on file  Social History Narrative  . Not on file   Additional Social History: N/A    Allergies:   Allergies  Allergen Reactions  . Amoxicillin Hives  . Codeine Itching  . Latex Itching  . Penicillins Hives    Has patient had a PCN reaction causing immediate rash, facial/tongue/throat swelling, SOB or lightheadedness with hypotension: No Has patient had a PCN reaction causing severe rash involving mucus membranes or skin necrosis: No Has patient had a PCN reaction that required hospitalization: No Has patient had a PCN reaction occurring within the last 10 years: No If all of the  above answers are "NO", then may proceed with Cephalosporin use.  . Prednisone Itching  . Augmentin [Amoxicillin-Pot Clavulanate] Hives and Rash  . Lamictal [Lamotrigine] Rash  . Tramadol Itching and Rash    Labs:  Results for orders placed or performed during the hospital encounter of 03/04/18 (from the past 48 hour(s))  Comprehensive metabolic panel     Status: Abnormal   Collection Time: 03/04/18  9:01 PM  Result  Value Ref Range   Sodium 137 135 - 145 mmol/L   Potassium 4.1 3.5 - 5.1 mmol/L   Chloride 108 98 - 111 mmol/L   CO2 24 22 - 32 mmol/L   Glucose, Bld 110 (H) 70 - 99 mg/dL   BUN 7 6 - 20 mg/dL   Creatinine, Ser 0.84 0.44 - 1.00 mg/dL   Calcium 8.6 (L) 8.9 - 10.3 mg/dL   Total Protein 6.2 (L) 6.5 - 8.1 g/dL   Albumin 3.3 (L) 3.5 - 5.0 g/dL   AST 14 (L) 15 - 41 U/L   ALT 13 0 - 44 U/L   Alkaline Phosphatase 59 38 - 126 U/L   Total Bilirubin 0.5 0.3 - 1.2 mg/dL   GFR calc non Af Amer >60 >60 mL/min   GFR calc Af Amer >60 >60 mL/min    Comment: (NOTE) The eGFR has been calculated using the CKD EPI equation. This calculation has not been validated in all clinical situations. eGFR's persistently <60 mL/min signify possible Chronic Kidney Disease.    Anion gap 5 5 - 15    Comment: Performed at [facility], [place] [address] [place], [place] [address]  Ethanol     Status: None   Collection Time: 03/04/18  9:01 PM  Result Value Ref Range   Alcohol, Ethyl (B) <10 <10 mg/dL    Comment: (NOTE) Lowest detectable limit for serum alcohol is 10 mg/dL. For medical purposes only. Performed at [facility], [place] [address], [place], [place] [address]   Salicylate level     Status: None   Collection Time: 03/04/18  9:01 PM  Result Value Ref Range   Salicylate Lvl <1.9 2.8 - 30.0 mg/dL    Comment: Performed at [facility], [place] [address], [place], [place] [address]  Acetaminophen level     Status: Abnormal   Collection  Time: 03/04/18  9:01 PM  Result Value Ref Range   Acetaminophen (Tylenol), Serum <10 (L) 10 - 30 ug/mL    Comment: (NOTE) Therapeutic concentrations vary significantly. A range of 10-30 ug/mL  may be an effective concentration for many patients. However, some  are best treated at concentrations outside of this range. Acetaminophen concentrations >150 ug/mL at 4 hours after ingestion  and >50 ug/mL at 12 hours after ingestion are often associated with  toxic reactions. Performed at [facility], [place] [address], [place], [place] [address]   cbc     Status: Abnormal   Collection Time: 03/04/18  9:01 PM  Result Value Ref Range   WBC 7.7 4.0 - 10.5 K/uL   RBC 4.77 3.87 - 5.11 MIL/uL   Hemoglobin 12.9 12.0 - 15.0 g/dL   HCT 38.6 36.0 - 46.0 %   MCV 80.9 78.0 - 100.0 fL   MCH 27.0 26.0 - 34.0 pg   MCHC 33.4 30.0 - 36.0 g/dL   RDW 17.7 (H) 11.5 - 15.5 %   Platelets 267 150 - 400 K/uL    Comment: Performed at [facility], [place] [address], [place], [place] [address]  Rapid urine drug screen (hospital performed)     Status: Abnormal   Collection Time: 03/04/18  9:05 PM  Result Value Ref Range   Opiates NONE DETECTED NONE DETECTED   Cocaine POSITIVE (A) NONE DETECTED   Benzodiazepines NONE DETECTED NONE DETECTED   Amphetamines NONE DETECTED NONE DETECTED   Tetrahydrocannabinol POSITIVE (A) NONE DETECTED   Barbiturates NONE DETECTED NONE DETECTED  Comment: (NOTE) DRUG SCREEN FOR MEDICAL PURPOSES ONLY.  IF CONFIRMATION IS NEEDED FOR ANY PURPOSE, NOTIFY LAB WITHIN 5 DAYS. LOWEST DETECTABLE LIMITS FOR URINE DRUG SCREEN Drug Class                     Cutoff (ng/mL) Amphetamine and metabolites    1000 Barbiturate and metabolites    200 Benzodiazepine                 381 Tricyclics and metabolites     300 Opiates and metabolites        300 Cocaine and metabolites        300 THC                            50 Performed at [facility], [place] [address], [place], [place] [address]   Pregnancy, urine     Status: None   Collection Time: 03/04/18  9:05 PM  Result Value Ref Range   Preg Test, Ur NEGATIVE NEGATIVE    Comment:        THE SENSITIVITY OF THIS METHODOLOGY IS >20 mIU/mL. Performed at [facility], [place] [address], [place], [place] [address]     Current Facility-Administered Medications  Medication Dose Route Frequency Provider Last Rate Last Dose  . 0.9 %  sodium chloride infusion   Intravenous Continuous Daleen Bo, MD   Stopped at 03/05/18 0022  . acetaminophen (TYLENOL) tablet 650 mg  650 mg Oral Q4H PRN Nanavati, Ankit, MD      . naproxen (NAPROSYN) tablet 500 mg  500 mg Oral BID PRN Kathrynn Humble, Ankit, MD   500 mg at 03/05/18 0737  . nicotine (NICODERM CQ - dosed in mg/24 hours) patch 21 mg  21 mg Transdermal Once Varney Biles, MD   21 mg at 03/05/18 0338  . nicotine (NICODERM CQ - dosed in mg/24 hours) patch 21 mg  21 mg Transdermal Daily Kathrynn Humble, Ankit, MD   21 mg at 03/05/18 1019  . ondansetron (ZOFRAN) tablet 4 mg  4 mg Oral Q8H PRN Nanavati, Ankit, MD      . zolpidem (AMBIEN) tablet 5 mg  5 mg Oral QHS PRN Varney Biles, MD       Current Outpatient Medications  Medication Sig Dispense Refill  . etonogestrel (NEXPLANON) 68 MG IMPL implant Inject 66 mg into the skin.      Musculoskeletal: Strength & Muscle Tone: within normal limits Gait & Station: not tested  Patient leans: N/A  Psychiatric Specialty Exam: Physical Exam  Nursing note and vitals reviewed. Constitutional: She is oriented to person, place, and time. She appears well-developed and well-nourished.  HENT:  Head: Normocephalic and atraumatic.  Neck: Normal range of motion.  Respiratory: Effort normal.  Musculoskeletal: Normal range of motion.  Neurological: She is alert and oriented to person, place, and time.  Psychiatric: Her speech is normal and behavior is normal. Her mood appears anxious.  Cognition and memory are normal. She expresses impulsivity. She exhibits a depressed mood. She expresses suicidal ideation.    Review of Systems  Psychiatric/Behavioral: Positive for depression, substance abuse and suicidal ideas. Negative for hallucinations and memory loss. The patient is not nervous/anxious and does not have insomnia.   All other systems reviewed and are negative.   Blood pressure (!) 132/118, pulse 75, temperature 98.3 F (36.8 C), resp. rate 15, SpO2 100 %, unknown if currently breastfeeding.There is  no height or weight on file to calculate BMI.  General Appearance: Casual  Eye Contact:  Good  Speech:  Clear and Coherent and Pressured  Volume:  Normal  Mood:  Anxious, Depressed, Hopeless and Worthless  Affect:  Congruent and Depressed  Thought Process:  Coherent, Linear and Descriptions of Associations: Intact  Orientation:  Full (Time, Place, and Person)  Thought Content:  Logical  Suicidal Thoughts:  Yes.  without intent/plan  Homicidal Thoughts:  No  Memory:  Immediate;   Good Recent;   Good Remote;   Fair  Judgement:  Poor  Insight:  Fair  Psychomotor Activity:  Normal  Concentration:  Concentration: Good and Attention Span: Good  Recall:  Good  Fund of Knowledge:  Good  Language:  Good  Akathisia:  No  Handed:  Right  AIMS (if indicated):   N/A  Assets:  Agricultural consultant Housing Social Support  ADL's:  Intact  Cognition:  WNL  Sleep:   N/A     Treatment Plan Summary: Daily contact with patient to assess and evaluate symptoms and progress in treatment and Medication management -Crisis stabilization Medications: -Gabapentin 300 mg three times daily for withdrawal symptoms  Disposition: Recommend psychiatric Inpatient admission when medically cleared. TTS to seek placement  Ethelene Hal, NP 03/05/2018 11:58 AM   Patient seen face-to-face for psychiatric evaluation, chart reviewed and case discussed  with the physician extender and developed treatment plan. Reviewed the information documented and agree with the treatment plan.  Buford Dresser, DO 03/05/18 5:52 PM

## 2018-03-05 NOTE — ED Notes (Addendum)
Patient calm and cooperative.  Patient visibly shaky from doing "too much cocaine."  "I almost died and I don't want to die."  "I am a good mother and don't want to do that to my children."  Patient asking for something for her "nerves".  Reports she has been having panic attacks from the withdrawals.  Patient also reports she has been in rehab before and "knows what that is all about."  She lives with her husband, children, and parents.  Patient denies SI, HI, and AVH.

## 2018-03-05 NOTE — Progress Notes (Signed)
Patient is a 32 year old female [facility]. Pt reports having OD on 'muscle relaxers', states that "I didn't want to die", reports that she just wants to get help for her cocaine addiction and start medication for Bipolar/schizoaffective disorder- Pt reports that she hasn't taken medication in several years. Pt reports daily cocaine use for 17 years, drinks 6 beers daily and/or smokes pot "to come down after cocaine".  Pt friendly and talkative during admission interview- answered questions appropriately. VS obtained. Skin assessment completed- revealing superficial cutting to left forearm- pt reports self cutting last week due to "coming off of cocaine". Pt currently denies SI/HI -but endorses"seeing shadows" and hearing voices putting her down at times- pt states that voices aren't audible, but "just in my head". Admission paperwork completed and signed. Verbal understanding expressed. Pt oriented to unit. Belongings searched and secured in locker. Q 15 min checks initiated for safety.

## 2018-03-05 NOTE — ED Notes (Addendum)
Pt belongings locked in locker #41.  Belongings: Black leggings, orange shirt, gray hoodie, pink sandals, pink bra, orange hair tie, black sunglasses. Pt belongings itemized on belongings sheet located in pts chart at nurses station.  Pt signed belongings sheet acknowledging her understanding that belongings would remain locked up until discharge.

## 2018-03-06 DIAGNOSIS — F3163 Bipolar disorder, current episode mixed, severe, without psychotic features: Secondary | ICD-10-CM | POA: Diagnosis not present

## 2018-03-06 DIAGNOSIS — Z811 Family history of alcohol abuse and dependence: Secondary | ICD-10-CM | POA: Diagnosis not present

## 2018-03-06 DIAGNOSIS — F17210 Nicotine dependence, cigarettes, uncomplicated: Secondary | ICD-10-CM

## 2018-03-06 DIAGNOSIS — F1994 Other psychoactive substance use, unspecified with psychoactive substance-induced mood disorder: Secondary | ICD-10-CM | POA: Diagnosis not present

## 2018-03-06 DIAGNOSIS — Z818 Family history of other mental and behavioral disorders: Secondary | ICD-10-CM

## 2018-03-06 DIAGNOSIS — Z813 Family history of other psychoactive substance abuse and dependence: Secondary | ICD-10-CM

## 2018-03-06 MED ORDER — ONDANSETRON 4 MG PO TBDP: 4.0000 mg | ORAL_TABLET | Freq: Four times a day (QID) | ORAL | Status: AC | PRN

## 2018-03-06 MED ORDER — ADULT MULTIVITAMIN W/MINERALS CH
1.0000 | ORAL_TABLET | Freq: Every day | ORAL | Status: DC
  Administered 2018-03-06 – 2018-03-12 (×7): 1 via ORAL
  Filled 2018-03-06 (×9): qty 1

## 2018-03-06 MED ORDER — CHLORDIAZEPOXIDE HCL 25 MG PO CAPS
25.0000 mg | ORAL_CAPSULE | Freq: Four times a day (QID) | ORAL | Status: DC | PRN
  Administered 2018-03-06 – 2018-03-07 (×2): 25 mg via ORAL
  Filled 2018-03-06 (×2): qty 1

## 2018-03-06 MED ORDER — VITAMIN B-1 100 MG PO TABS
100.0000 mg | ORAL_TABLET | Freq: Every day | ORAL | Status: DC
  Administered 2018-03-07 – 2018-03-12 (×6): 100 mg via ORAL
  Filled 2018-03-06 (×8): qty 1

## 2018-03-06 MED ORDER — LOPERAMIDE HCL 2 MG PO CAPS: 2.0000 mg | ORAL_CAPSULE | ORAL | Status: AC | PRN

## 2018-03-06 MED ORDER — THIAMINE HCL 100 MG/ML IJ SOLN: 100.0000 mg | Freq: Once | INTRAMUSCULAR | Status: DC

## 2018-03-06 MED ORDER — HYDROXYZINE HCL 25 MG PO TABS
25.0000 mg | ORAL_TABLET | Freq: Four times a day (QID) | ORAL | Status: AC | PRN
  Administered 2018-03-06 – 2018-03-08 (×6): 25 mg via ORAL
  Filled 2018-03-06 (×6): qty 1

## 2018-03-06 NOTE — H&P (Signed)
Psychiatric Admission Assessment Adult  Patient Identification: Cynthia Odonnell MRN:  [id number] Date of Evaluation:  03/06/2018 Chief Complaint:  SCHIZOAFFECTIVE DISORDER COCAINE USE DISORDER ALCOHOL USE DISORDER Principal Diagnosis: Substance induced mood disorder ([place]) Diagnosis:   Patient Active Problem List   Diagnosis Date Noted  . Substance induced mood disorder ([place]) [F19.94] 03/06/2018  . Bipolar 1 disorder, mixed, severe ([place]) [F31.63] 03/05/2018  . Bipolar affective disorder ([place]) [F31.9]   . Bradycardia [R00.1]   . Cocaine abuse ([place]) [F14.10]   . Hypotension [I95.9]   . Suicide attempt ([place]) [T14.91XA]   . H/O cesarean section [Z98.891] 11/04/2014  . [redacted] weeks gestation of pregnancy [Z3A.37]   . Maternal morbid obesity in third trimester, antepartum ([place]) [E75.170, E66.01]   . Excessive weight gain in pregnancy in second trimester [O26.02]   . [redacted] weeks gestation of pregnancy [Z3A.33]   . Smoking [F17.200]   . Poor fetal growth affecting management of mother in third trimester, antepartum [O36.5930]   . [redacted] weeks gestation of pregnancy [Z3A.30]   . Maternal morbid obesity in second trimester, antepartum ([place]) [Y17.494, E66.01]   . [redacted] weeks gestation of pregnancy [Z3A.24]   . Evaluate anatomy not seen on prior sonogram [Z04.89]   . Tobacco smoking affecting pregnancy in second trimester, antepartum [O99.332]   . Obesity affecting pregnancy in second trimester [O99.212]   . Uterine scar from previous cesarean delivery, antepartum complication [W96.759]   . Encounter for fetal anatomic survey [Z36.89]   . [redacted] weeks gestation of pregnancy [Z3A.20]    History of Present Illness:   On evaluation today: Patient is very pleasant and cooperative today she is talkative.  Upon discussing her symptoms and her diagnosis from the past, she reports that she has been using marijuana and cocaine since the age of 19 and that was when she was diagnosed.  She states that she is not even sure  of who she is anymore without drugs.  She reports that the only time that she has been sober was approximately 10 years ago for 9 months during the pregnancy and the only medication she remained on at that time was Prozac and she remained stable for that time.  Patient's mother was contacted with patient in the room and mom confirms this information.  Mom also states that she is concerned that patient may be taken medication she does not need, due to the fact that there was no hallucinations, bizarre behavior, or manic episodes while she was sober for 9 months.  Patient agrees to hold on any medications for schizoaffective or bipolar at this time and we will monitor for the next few days.  Associated Signs/Symptoms: Depression Symptoms:  depressed mood, feelings of worthlessness/guilt, hopelessness, suicidal thoughts without plan, anxiety, loss of energy/fatigue, (Hypo) Manic Symptoms:  Denies when sober Anxiety Symptoms:  Excessive Worry, Psychotic Symptoms:  Denies when sober PTSD Symptoms: NA Total Time spent with patient: 45 minutes  Past Psychiatric History: Patient reports a history of schizoaffective disorder, bipolar disorder, and depression.  Patient reported diagnosed at age 43 while using substances and all diagnoses have been done while she has been using a type of substance.  Is the patient at risk to self? No.  Has the patient been a risk to self in the past 6 months? Yes.    Has the patient been a risk to self within the distant past? Yes.    Is the patient a risk to others? No.  Has the patient been a risk  to others in the past 6 months? No.  Has the patient been a risk to others within the distant past? No.   Prior Inpatient Therapy:   Prior Outpatient Therapy:    Alcohol Screening: Patient refused Alcohol Screening Tool: Yes Substance Abuse History in the last 12 months:  Yes.   Consequences of Substance Abuse: Medical Consequences:  Reviewed Legal Consequences:   Reviewed Family Consequences:  Reviewed Previous Psychotropic Medications: Yes  Psychological Evaluations: Yes  Past Medical History:  Past Medical History:  Diagnosis Date  . Abnormal Pap smear of cervix   . Anxiety   . Arthritis   . Bronchitis    Hx - uses albuterol inhaler prn  . Cocaine abuse, in remission ([place])   . Depression   . GERD (gastroesophageal reflux disease)    zantac prn  . Headache(784.0)    otc prn  . Optic neuritis    left eye  . Smoker   . Termination of pregnancy (fetus)    x 2    Past Surgical History:  Procedure Laterality Date  . CESAREAN SECTION  07/31/2008   [place]  . CESAREAN SECTION N/A 11/04/2014   Procedure: CESAREAN SECTION;  Surgeon: Newton Pigg, MD;  Location: [facility];  Service: Obstetrics;  Laterality: N/A;  . COLPOSCOPY    . WISDOM TOOTH EXTRACTION     Family History:  Family History  Problem Relation Age of Onset  . Other Neg Hx    Family Psychiatric  History: Patient is adopted but adoptive mother reports that her biological sister is diagnosed with bipolar, biological brother was diagnosed with ADHD and bipolar, maternal grandmother was an alcoholic, biological mother was chronic substance abuse and was potentially using ketamine while pregnant with patient  Tobacco Screening: Have you used any form of tobacco in the last 30 days? (Cigarettes, Smokeless Tobacco, Cigars, and/or Pipes): Yes Tobacco use, Select all that apply: 5 or more cigarettes per day Are you interested in Tobacco Cessation Medications?: No, patient refused Counseled patient on smoking cessation including recognizing danger situations, developing coping skills and basic information about quitting provided: Refused/Declined practical counseling Social History:  Social History   Substance and Sexual Activity  Alcohol Use No     Social History   Substance and Sexual Activity  Drug Use Yes  . Frequency: 3.0 times per week  . Types: Cocaine   Comment: states  "usually 2 grams at at time"    Additional Social History:                           Allergies:   Allergies  Allergen Reactions  . Amoxicillin Hives  . Codeine Itching  . Latex Itching  . Penicillins Hives    Has patient had a PCN reaction causing immediate rash, facial/tongue/throat swelling, SOB or lightheadedness with hypotension: No Has patient had a PCN reaction causing severe rash involving mucus membranes or skin necrosis: No Has patient had a PCN reaction that required hospitalization: No Has patient had a PCN reaction occurring within the last 10 years: No If all of the above answers are "NO", then may proceed with Cephalosporin use.  . Prednisone Itching  . Augmentin [Amoxicillin-Pot Clavulanate] Hives and Rash  . Lamictal [Lamotrigine] Rash  . Tramadol Itching and Rash   Lab Results:  Results for orders placed or performed during the hospital encounter of 03/04/18 (from the past 48 hour(s))  Comprehensive metabolic panel     Status: Abnormal  Collection Time: 03/04/18  9:01 PM  Result Value Ref Range   Sodium 137 135 - 145 mmol/L   Potassium 4.1 3.5 - 5.1 mmol/L   Chloride 108 98 - 111 mmol/L   CO2 24 22 - 32 mmol/L   Glucose, Bld 110 (H) 70 - 99 mg/dL   BUN 7 6 - 20 mg/dL   Creatinine, Ser 0.84 0.44 - 1.00 mg/dL   Calcium 8.6 (L) 8.9 - 10.3 mg/dL   Total Protein 6.2 (L) 6.5 - 8.1 g/dL   Albumin 3.3 (L) 3.5 - 5.0 g/dL   AST 14 (L) 15 - 41 U/L   ALT 13 0 - 44 U/L   Alkaline Phosphatase 59 38 - 126 U/L   Total Bilirubin 0.5 0.3 - 1.2 mg/dL   GFR calc non Af Amer >60 >60 mL/min   GFR calc Af Amer >60 >60 mL/min    Comment: (NOTE) The eGFR has been calculated using the CKD EPI equation. This calculation has not been validated in all clinical situations. eGFR's persistently <60 mL/min signify possible Chronic Kidney Disease.    Anion gap 5 5 - 15    Comment: Performed at [facility], [place] [address], [place], [place] [address]   Ethanol     Status: None   Collection Time: 03/04/18  9:01 PM  Result Value Ref Range   Alcohol, Ethyl (B) <10 <10 mg/dL    Comment: (NOTE) Lowest detectable limit for serum alcohol is 10 mg/dL. For medical purposes only. Performed at [facility], [place] [address], [place], [place] [address]   Salicylate level     Status: None   Collection Time: 03/04/18  9:01 PM  Result Value Ref Range   Salicylate Lvl <4.6 2.8 - 30.0 mg/dL    Comment: Performed at [facility], [place] [address] [place], [place] [address]  Acetaminophen level     Status: Abnormal   Collection Time: 03/04/18  9:01 PM  Result Value Ref Range   Acetaminophen (Tylenol), Serum <10 (L) 10 - 30 ug/mL    Comment: (NOTE) Therapeutic concentrations vary significantly. A range of 10-30 ug/mL  may be an effective concentration for many patients. However, some  are best treated at concentrations outside of this range. Acetaminophen concentrations >150 ug/mL at 4 hours after ingestion  and >50 ug/mL at 12 hours after ingestion are often associated with  toxic reactions. Performed at [facility], [place] [address], [place], [place] [address]   cbc     Status: Abnormal   Collection Time: 03/04/18  9:01 PM  Result Value Ref Range   WBC 7.7 4.0 - 10.5 K/uL   RBC 4.77 3.87 - 5.11 MIL/uL   Hemoglobin 12.9 12.0 - 15.0 g/dL   HCT 38.6 36.0 - 46.0 %   MCV 80.9 78.0 - 100.0 fL   MCH 27.0 26.0 - 34.0 pg   MCHC 33.4 30.0 - 36.0 g/dL   RDW 17.7 (H) 11.5 - 15.5 %   Platelets 267 150 - 400 K/uL    Comment: Performed at [facility], [place] [address], [place], [place] [address]  Rapid urine drug screen (hospital performed)     Status: Abnormal   Collection Time: 03/04/18  9:05 PM  Result Value Ref Range   Opiates NONE DETECTED NONE DETECTED   Cocaine POSITIVE (A) NONE DETECTED   Benzodiazepines NONE DETECTED NONE DETECTED   Amphetamines NONE DETECTED NONE  DETECTED   Tetrahydrocannabinol POSITIVE (A)  NONE DETECTED   Barbiturates NONE DETECTED NONE DETECTED    Comment: (NOTE) DRUG SCREEN FOR MEDICAL PURPOSES ONLY.  IF CONFIRMATION IS NEEDED FOR ANY PURPOSE, NOTIFY LAB WITHIN 5 DAYS. LOWEST DETECTABLE LIMITS FOR URINE DRUG SCREEN Drug Class                     Cutoff (ng/mL) Amphetamine and metabolites    1000 Barbiturate and metabolites    200 Benzodiazepine                 053 Tricyclics and metabolites     300 Opiates and metabolites        300 Cocaine and metabolites        300 THC                            50 Performed at [facility], [place] [address] [place], [place] [address]   Pregnancy, urine     Status: None   Collection Time: 03/04/18  9:05 PM  Result Value Ref Range   Preg Test, Ur NEGATIVE NEGATIVE    Comment:        THE SENSITIVITY OF THIS METHODOLOGY IS >20 mIU/mL. Performed at [facility], [place] [address] [place], [place] [address]     Blood Alcohol level:  Lab Results  Component Value Date   ETH <10 03/04/2018   [facility]  01/28/2009    <5        LOWEST DETECTABLE LIMIT FOR SERUM ALCOHOL IS 5 mg/dL FOR MEDICAL PURPOSES ONLY    Metabolic Disorder Labs:  No results found for: HGBA1C, MPG No results found for: PROLACTIN No results found for: CHOL, TRIG, HDL, CHOLHDL, VLDL, LDLCALC  Current Medications: Current Facility-Administered Medications  Medication Dose Route Frequency Provider Last Rate Last Dose  . acetaminophen (TYLENOL) tablet 650 mg  650 mg Oral Q6H PRN Ethelene Hal, NP      . alum & mag hydroxide-simeth (MAALOX/MYLANTA) 200-200-20 MG/5ML suspension 30 mL  30 mL Oral Q4H PRN Ethelene Hal, NP      . chlordiazePOXIDE (LIBRIUM) capsule 25 mg  25 mg Oral Q6H PRN Rhaelyn Giron, Lowry Ram, FNP      . gabapentin (NEURONTIN) capsule 300 mg  300 mg Oral TID Ethelene Hal, NP   300 mg at 03/06/18 1006  . hydrOXYzine (ATARAX/VISTARIL) tablet 25 mg  25  mg Oral TID PRN Ethelene Hal, NP   25 mg at 03/06/18 9024  . hydrOXYzine (ATARAX/VISTARIL) tablet 25 mg  25 mg Oral Q6H PRN Alder Murri, Lowry Ram, FNP      . loperamide (IMODIUM) capsule 2-4 mg  2-4 mg Oral PRN Tangala Wiegert, Lowry Ram, FNP      . magnesium hydroxide (MILK OF MAGNESIA) suspension 30 mL  30 mL Oral Daily PRN Ethelene Hal, NP      . multivitamin with minerals tablet 1 tablet  1 tablet Oral Daily Jeriel Vivanco, Lowry Ram, FNP      . naproxen (NAPROSYN) tablet 500 mg  500 mg Oral BID PRN Ethelene Hal, NP      . nicotine (NICODERM CQ - dosed in mg/24 hours) patch 21 mg  21 mg Transdermal Daily Ethelene Hal, NP   21 mg at 03/06/18 1006  . ondansetron (ZOFRAN-ODT) disintegrating tablet 4 mg  4 mg Oral Q6H PRN Leshon Armistead, Lowry Ram, FNP      . thiamine (B-1)  injection 100 mg  100 mg Intramuscular Once Mamoudou Mulvehill, Lowry Ram, FNP      . [START ON 03/07/2018] thiamine (VITAMIN B-1) tablet 100 mg  100 mg Oral Daily Zarian Colpitts B, FNP      . traZODone (DESYREL) tablet 50 mg  50 mg Oral QHS PRN Ethelene Hal, NP   50 mg at 03/06/18 0043   PTA Medications: Medications Prior to Admission  Medication Sig Dispense Refill Last Dose  . etonogestrel (NEXPLANON) 68 MG IMPL implant Inject 66 mg into the skin.       Musculoskeletal: Strength & Muscle Tone: within normal limits Gait & Station: normal Patient leans: N/A  Psychiatric Specialty Exam: Physical Exam  Nursing note and vitals reviewed. Constitutional: She is oriented to person, place, and time. She appears well-developed and well-nourished.  Cardiovascular: Normal rate.  Respiratory: Effort normal.  Musculoskeletal: Normal range of motion.  Neurological: She is alert and oriented to person, place, and time.  Skin: Skin is warm.    Review of Systems  Constitutional: Negative.   HENT: Negative.   Eyes: Negative.   Respiratory: Negative.   Cardiovascular: Negative.   Gastrointestinal: Negative.   Genitourinary:  Negative.   Musculoskeletal: Negative.   Skin: Negative.   Neurological: Negative.   Endo/Heme/Allergies: Negative.   Psychiatric/Behavioral: Positive for depression, hallucinations, substance abuse and suicidal ideas. The patient is nervous/anxious.        All symptoms present while using drugs    Blood pressure (!) 121/91, pulse 79, temperature 97.7 F (36.5 C), temperature source Oral, resp. rate 20, height _0  (1.676 m), weight 103.9 kg (229 lb), SpO2 100 %, unknown if currently breastfeeding.Body mass index is 36.96 kg/m.  General Appearance: Disheveled  Eye Contact:  Fair  Speech:  Clear and Coherent and Normal Rate  Volume:  Normal  Mood:  Euthymic  Affect:  Congruent  Thought Process:  Goal Directed and Descriptions of Associations: Intact  Orientation:  Full (Time, Place, and Person)  Thought Content:  WDL  Suicidal Thoughts:  No  Homicidal Thoughts:  No  Memory:  Immediate;   Good Recent;   Good Remote;   Good  Judgement:  Fair  Insight:  Fair  Psychomotor Activity:  Normal  Concentration:  Concentration: Good and Attention Span: Good  Recall:  Good  Fund of Knowledge:  Good  Language:  Good  Akathisia:  No  Handed:  Right  AIMS (if indicated):     Assets:  Communication Skills Desire for Improvement Financial Resources/Insurance Housing Physical Health Social Support Transportation  ADL's:  Intact  Cognition:  WNL  Sleep:  Number of Hours: 4.5    Treatment Plan Summary: Daily contact with patient to assess and evaluate symptoms and progress in treatment, Medication management and Plan is to: Encourage group therapy participation See MAR and SRA for medication management  Observation Level/Precautions:  15 minute checks  Laboratory:  Reviewed  Psychotherapy: Group therapy  Medications: See [facility]  Consultations: As needed  Discharge Concerns: Relapse  Estimated LOS: 3-5 days  Other: Admit to [place] for Primary  Diagnosis: Substance induced mood disorder ([place]) Long Term Goal(s): Improvement in symptoms so as ready for discharge  Short Term Goals: Ability to identify changes in lifestyle to reduce recurrence of condition will improve, Ability to verbalize feelings will improve, Ability to disclose and discuss suicidal ideas and Ability to demonstrate self-control will improve  Physician Treatment Plan for Secondary Diagnosis: Principal Problem:  Substance induced mood disorder ([facility]) Active Problems:   Bipolar 1 disorder, mixed, severe ([place])  Long Term Goal(s): Improvement in symptoms so as ready for discharge  Short Term Goals: Ability to identify and develop effective coping behaviors will improve, Ability to maintain clinical measurements within normal limits will improve, Compliance with prescribed medications will improve and Ability to identify triggers associated with substance abuse/mental health issues will improve  I certify that inpatient services furnished can reasonably be expected to improve the patient's condition.    Lewis Shock, FNP 8/3/201911:57 AM

## 2018-03-06 NOTE — Progress Notes (Signed)
Patient ID: Cynthia Odonnell, female   DOB: 08/17/1985, 32 y.o.   MRN: [id number]  Nursing Progress Note [id number]  Data: Patient presents with anxious mood and rapid, tangential speech. Patient slept late this morning and complained of anxiety/withdrawal symptoms. Patient complaint with scheduled medications. Patient completed self-inventory sheet and rates depression, hopelessness, and anxiety 5,5,5 respectively. Patient rates their sleep and appetite as poor/good respectively. Patient states goal for today is to "see the doctor, talk about how I'm feeling, attend all groups and stay awake all day". Patient is seen attending groups and visible in the milieu. Patient currently denies SI/HI/AVH. Patient reports she is having "horrible cravings" and that this is her longest period of sobriety. 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  Action: Patient educated about and provided medication per provider's orders. Patient safety maintained with q15 min safety checks and frequent rounding. Low fall risk precautions in place. Emotional support given. 1:1 interaction and active listening provided. Patient encouraged to attend meals and groups. Patient encouraged to work on treatment plan and goals. Labs, vital signs and patient behavior monitored throughout shift.   Response: Patient agrees to come to staff if any thoughts of SI/HI develop or if patient develops intention of acting on thoughts. Patient remains safe on the unit at this time. Patient is interacting with peers appropriately on the unit. Will continue to support and monitor.
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
## 2018-03-06 NOTE — [facility] Suicide Risk Assessment (Signed)
[facility]   Nursing information obtained from:  Patient Demographic factors:  Caucasian, Unemployed, Low socioeconomic status Current Mental Status:  Suicidal ideation indicated by others, Self-harm behaviors Loss Factors:  Financial problems / change in socioeconomic status Historical Factors:  Impulsivity Risk Reduction Factors:  Sense of responsibility to family, Positive social support, Living with another person, especially a relative, Responsible for children under 32 years of age  Total Time spent with patient: 30 minutes Principal Problem: Substance induced mood disorder (HCC) Diagnosis:   Patient Active Problem List   Diagnosis Date Noted  . Substance induced mood disorder (HCC) [F19.94] 03/06/2018  . Bipolar 1 disorder, mixed, severe (HCC) [F31.63] 03/05/2018  . Bipolar affective disorder (HCC) [F31.9]   . Bradycardia [R00.1]   . Cocaine abuse (HCC) [F14.10]   . Hypotension [I95.9]   . Suicide attempt (HCC) [T14.91XA]   . H/O cesarean section [Z98.891] 11/04/2014  . [redacted] weeks gestation of pregnancy [Z3A.37]   . Maternal morbid obesity in third trimester, antepartum (HCC) [M84.132[O99.213, E66.01]   . Excessive weight gain in pregnancy in second trimester [O26.02]   . [redacted] weeks gestation of pregnancy [Z3A.33]   . Smoking [F17.200]   . Poor fetal growth affecting management of mother in third trimester, antepartum [O36.5930]   . [redacted] weeks gestation of pregnancy [Z3A.30]   . Maternal morbid obesity in second trimester, antepartum (HCC) [G40.102[O99.212, E66.01]   . [redacted] weeks gestation of pregnancy [Z3A.24]   . Evaluate anatomy not seen on prior sonogram [Z04.89]   . Tobacco smoking affecting pregnancy in second trimester, antepartum [O99.332]   . Obesity affecting pregnancy in second trimester [O99.212]   . Uterine scar from previous cesarean delivery, antepartum complication [O34.219]   . Encounter for fetal anatomic survey [Z36.89]   . [redacted] weeks gestation of  pregnancy [Z3A.20]    Subjective Data: Patient is seen and examined.  Patient is a 32 year old female with a reported past psychiatric history significant for schizoaffective disorder, cocaine dependence, alcohol use disorder and marijuana use disorder who [facility] with suicidal ideation.  The patient stated that she had been using cocaine excessively for the last 17 years.  She stated the longest period of sobriety that she had was approximately 9 months when she was pregnant.  She lives with her husband and 2 children.  She began taking type tizanidine 2 mg tablets 12 at a time.  She kept repeating this.  She became so sedated and lethargic that she originally went to the behavioral health hospital, but then was referred to the medical hospital because of bradycardia.  Apparently her heart rate did drop down to the 30s.  She stated that she wanted to get better, she wanted to get off drugs, and for now was frightened by the prospect that she might die because of her low heart rate.  She was cleared medically and then sent to the behavioral hospital for evaluation and stabilization.  She has been treated with several medications in the past including lithium, but she stated that all the time that she had been treated she was continuing to use substances.  She was admitted to the hospital for evaluation and stabilization.  Continued Clinical Symptoms:    The "Alcohol Use Disorders Identification Test", Guidelines for Use in Primary Care, Second Edition.  World Science writerHealth Organization [facility](WHO). Score between 0-7:  no or low risk or alcohol related problems. Score between 8-15:  moderate risk  of alcohol related problems. Score between 16-19:  high risk of alcohol related problems. Score 20 or above:  warrants further diagnostic evaluation for alcohol dependence and treatment.   CLINICAL FACTORS:   Depression:   Anhedonia Comorbid alcohol  abuse/dependence Hopelessness Impulsivity Insomnia Alcohol/Substance Abuse/Dependencies   Musculoskeletal: Strength & Muscle Tone: within normal limits Gait & Station: normal Patient leans: N/A  Psychiatric Specialty Exam: Physical Exam  Nursing note and vitals reviewed. Constitutional: She is oriented to person, place, and time. She appears well-developed and well-nourished.  HENT:  Head: Normocephalic and atraumatic.  Respiratory: Effort normal.  Neurological: She is alert and oriented to person, place, and time.    ROS  Blood pressure 138/63, pulse 71, temperature 97.7 F (36.5 C), temperature source Oral, resp. rate 20, height 5\' 6"  (1.676 m), weight 103.9 kg (229 lb), SpO2 100 %, unknown if currently breastfeeding.Body mass index is 36.96 kg/m.  General Appearance: Disheveled  Eye Contact:  Fair  Speech:  Normal Rate  Volume:  Increased  Mood:  Anxious and Depressed  Affect:  Congruent  Thought Process:  Coherent  Orientation:  Full (Time, Place, and Person)  Thought Content:  Logical  Suicidal Thoughts:  No  Homicidal Thoughts:  No  Memory:  Immediate;   Fair Recent;   Fair Remote;   Fair  Judgement:  Intact  Insight:  Fair  Psychomotor Activity:  Increased  Concentration:  Concentration: Poor and Attention Span: Fair  Recall:  [employer] of Knowledge:  Fair  Language:  Fair  Akathisia:  Negative  Handed:  Right  AIMS (if indicated):     Assets:  Communication Skills Desire for Improvement Financial Resources/Insurance Housing Physical Health Resilience Social Support  ADL's:  Intact  Cognition:  WNL  Sleep:  Number of Hours: 4.5      COGNITIVE FEATURES THAT CONTRIBUTE TO RISK:  None    SUICIDE RISK:   Mild:  Suicidal ideation of limited frequency, intensity, duration, and specificity.  There are no identifiable plans, no associated intent, mild dysphoria and related symptoms, good self-control (both objective and subjective assessment), few  other risk factors, and identifiable protective factors, including available and accessible social support.  PLAN OF CARE: Patient is seen and examined.  Patient is a 32 year old female with the above-stated past psychiatric history was seen on admission with suicidal ideation, depression, cocaine dependence, and a reported history of schizoaffective disorder versus bipolar disorder.  She did admit that all the time she had been treated for these disorders in the past she had continued to use substances.  Her drug screen was positive for cocaine and marijuana, her blood alcohol was negative.  I agree that at this point we should hold off on standing medications for mania until we see what she looks like off substances.  She will be admitted to the hospital.  She will be integrated into the milieu.  She will be encouraged to attend groups.  She will be placed on the Librium detox protocol.  She will be placed on 15-minute checks for detox as well as for safety.  We will collect collateral information from her family.  We will monitor her EKG and vital signs closely given her bradycardia in the emergency room.  I certify that inpatient services furnished can reasonably be expected to improve the patient's condition.   Antonieta Pert, MD 03/06/2018, 2:28 PM

## 2018-03-06 NOTE — BHH Counselor (Signed)
Patient was on the telephone twice that CSW attempted to meet with pt for PSA.

## 2018-03-06 NOTE — [facility] Group Notes (Signed)
[facility] Group Notes:  (Nursing/MHT/Case Management/Adjunct)  Date:  03/06/2018  Time:  2:41 PM  Type of Therapy:  Psychoeducational Skills  Participation Level:  Active  Participation Quality:  Appropriate  Affect:  Appropriate  Cognitive:  Appropriate  Insight:  Appropriate  Engagement in Group:  Engaged  Modes of Intervention:  Problem-solving  Summary of Progress/Problems: Pt attended Psychoeducational group with top topic anger management.   Jesi Jurgens Shanta 03/06/2018, 2:41 PM 

## 2018-03-06 NOTE — Progress Notes (Signed)
Writer has observed patient up in the dayroom interacting with peers. She attended group and later came and requested her medications. She reported that she was 3 days sober and plans to attend a treatment facility once discharged. Support given and safety maintained on unit with 15 min checks.

## 2018-03-07 DIAGNOSIS — F1490 Cocaine use, unspecified, uncomplicated: Secondary | ICD-10-CM

## 2018-03-07 DIAGNOSIS — F1994 Other psychoactive substance use, unspecified with psychoactive substance-induced mood disorder: Secondary | ICD-10-CM | POA: Diagnosis not present

## 2018-03-07 DIAGNOSIS — G4700 Insomnia, unspecified: Secondary | ICD-10-CM | POA: Diagnosis not present

## 2018-03-07 DIAGNOSIS — F17210 Nicotine dependence, cigarettes, uncomplicated: Secondary | ICD-10-CM | POA: Diagnosis not present

## 2018-03-07 NOTE — Plan of Care (Signed)
  Problem: Education: Goal: Knowledge of [facility] Education information/materials will improve Outcome: Progressing   Problem: Activity: Goal: Interest or engagement in activities will improve Outcome: Progressing   Problem: Safety: Goal: Periods of time without injury will increase Outcome: Progressing   

## 2018-03-07 NOTE — Progress Notes (Signed)
Writer listened to patient as she vented about her visit with her husband on tonight. She became very upset with their conversation about her going to a treatment facility once discharged and his suggestion for who would look after her child while she would be gone.  Writer suggested that she journal about how she felt and the discussion she would have with her husband concerning her decison. She was medicated to help calm her for tonight.

## 2018-03-07 NOTE — Progress Notes (Signed)
DAR NOTE: Patient presents with bright affect and calm mood.  Pt has been observed in the milieu interacting with peers. Pt stated she feel better and hoping to go for long term treatment after she leave here Reports good night sleep, good appetite, hyper energy and poor concentration. Complained of anxiety, vistaril 25 mg PO given with good relief.  Denies pain, auditory and visual hallucinations.  Rates depression at 5, hopelessness at 3, and anxiety at 6.  Maintained on routine safety checks.  Medications given as prescribed.  Support and encouragement offered as needed.  Attended group and participated.  States goal for today is " finding out options for additional treatments."  Patient observed socializing with peers in the dayroom.  Offered no complaint.

## 2018-03-07 NOTE — [facility] Group Notes (Signed)
[facility] LCSW Group Therapy Note  03/07/2018  10:00-11:00AM  Type of Therapy and Topic:  Group Therapy:  Building Supports  Participation Level: Active  Description of Group:  Patients in this group were introduced to the idea of adding a variety of healthy supports to address the various needs in their lives.  Different types of support were defined and described, and patients were asked to act out what each type could be.  Patients discussed what additional healthy supports could be helpful in their recovery and wellness after discharge in order to prevent future hospitalizations.   An emphasis was placed on following up with the discharge plan when they leave the hospital in order to continue becoming healthier and happier.  Therapeutic Goals: 1)  demonstrate the importance of adding supports  2)  discuss 4 definitions of support  3)  identify the patient's current level of healthy support and   4)  elicit commitments to add one healthy support   Summary of Patient Progress:  The patient expressed her current supports are her family, God and meetings. Patient reports she plans to add an inpatient rehab, then therapy and more family supports upon discharge from the hospital. Patient was able to acknowledge there are some unhealthy supports such as friends that she needs to distance herself from.   Therapeutic Modalities:   Motivational Interviewing Brief Solution-Focused Therapy  Cynthia CleverlyStephanie N Jamie Belger

## 2018-03-07 NOTE — Progress Notes (Signed)
[facility] MD Progress Note  03/07/2018 9:55 AM Cynthia Odonnell  MRN:  [id number]   Subjective: Patient reports today that she is slept very well last night and she has been having a good appetite.  She did report that she has been feeling a little more energetic last night and this morning and feels it may be due to the caffeine she has been drinking.  She states that she is still unsure if this is due to her coming off the cocaine or if this is part of some mental health that she may have.  Patient reports that she has been on mood stabilizers in the past and included Depakote, lithium, and Lamictal.  She reports that she has a rash from the Lamictal.  She reports that she stayed on lithium for years and she feels it may have worked for her, however she was continuing to use cocaine during that time.  She denies any SI/HI/AVH and contracts for safety today.  Patient also wanting to inform me that she feels that she lost her sense of smell approximately 1 year ago.  Patient also informed me that she has chronic feelings of finding a way to get her next "high"and finds herself wondering if any of the medications that we give her she can get high off of.  She states that she has no control over these thoughts and is not trying to find something to get high off of but these are the thoughts that is going through her mind and she wanted to inform me.  Objective: Patient presents sitting in the group room with another patient reading the Bible.  Patient is pleasant and cooperative and talkative today.  She has been attending groups and has been interacting with peers and staff appropriately.  She will continue the current medications that she is on.  Feel that she needs to be monitored for 1-2 more days to ensure that she is not going to have a manic episode as she reports feeling that hyper today.  She does not appear to be fidgety or restless or showing any manic symptoms to me.  Principal Problem: Substance induced  mood disorder (HCC) Diagnosis:   Patient Active Problem List   Diagnosis Date Noted  . Substance induced mood disorder (HCC) [F19.94] 03/06/2018  . Bipolar 1 disorder, mixed, severe (HCC) [F31.63] 03/05/2018  . Bipolar affective disorder (HCC) [F31.9]   . Bradycardia [R00.1]   . Cocaine abuse (HCC) [F14.10]   . Hypotension [I95.9]   . Suicide attempt (HCC) [T14.91XA]   . H/O cesarean section [Z98.891] 11/04/2014  . [redacted] weeks gestation of pregnancy [Z3A.37]   . Maternal morbid obesity in third trimester, antepartum (HCC) [W09.811, E66.01]   . Excessive weight gain in pregnancy in second trimester [O26.02]   . [redacted] weeks gestation of pregnancy [Z3A.33]   . Smoking [F17.200]   . Poor fetal growth affecting management of mother in third trimester, antepartum [O36.5930]   . [redacted] weeks gestation of pregnancy [Z3A.30]   . Maternal morbid obesity in second trimester, antepartum (HCC) [B14.782, E66.01]   . [redacted] weeks gestation of pregnancy [Z3A.24]   . Evaluate anatomy not seen on prior sonogram [Z04.89]   . Tobacco smoking affecting pregnancy in second trimester, antepartum [O99.332]   . Obesity affecting pregnancy in second trimester [O99.212]   . Uterine scar from previous cesarean delivery, antepartum complication [O34.219]   . Encounter for fetal anatomic survey [Z36.89]   . [redacted] weeks gestation of pregnancy [Z3A.20]  Total Time spent with patient: 20 minutes  Past Psychiatric History: See H&P  Past Medical History:  Past Medical History:  Diagnosis Date  . Abnormal Pap smear of cervix   . Anxiety   . Arthritis   . Bronchitis    Hx - uses albuterol inhaler prn  . Cocaine abuse, in remission (HCC)   . Depression   . GERD (gastroesophageal reflux disease)    zantac prn  . Headache(784.0)    otc prn  . Optic neuritis    left eye  . Smoker   . Termination of pregnancy (fetus)    x 2    Past Surgical History:  Procedure Laterality Date  . CESAREAN SECTION  07/31/2008   [facility]  .  CESAREAN SECTION N/A 11/04/2014   Procedure: CESAREAN SECTION;  Surgeon: Tracey Harries, MD;  Location: [facility] ORS;  Service: Obstetrics;  Laterality: N/A;  . COLPOSCOPY    . WISDOM TOOTH EXTRACTION     Family History:  Family History  Problem Relation Age of Onset  . Other Neg Hx    Family Psychiatric  History: See H&P Social History:  Social History   Substance and Sexual Activity  Alcohol Use No     Social History   Substance and Sexual Activity  Drug Use Yes  . Frequency: 3.0 times per week  . Types: Cocaine   Comment: states "usually 2 grams at at time"    Social History   Socioeconomic History  . Marital status: Married    Spouse name: Not on file  . Number of children: Not on file  . Years of education: Not on file  . Highest education level: Not on file  Occupational History  . Not on file  Social Needs  . Financial resource strain: Not on file  . Food insecurity:    Worry: Not on file    Inability: Not on file  . Transportation needs:    Medical: Not on file    Non-medical: Not on file  Tobacco Use  . Smoking status: Current Every Day Smoker    Packs/day: 1.00    Years: 16.00    Pack years: 16.00    Types: Cigarettes  . Smokeless tobacco: Never Used  Substance and Sexual Activity  . Alcohol use: No  . Drug use: Yes    Frequency: 3.0 times per week    Types: Cocaine    Comment: states "usually 2 grams at at time"  . Sexual activity: Not on file  Lifestyle  . Physical activity:    Days per week: Not on file    Minutes per session: Not on file  . Stress: Not on file  Relationships  . Social connections:    Talks on phone: Not on file    Gets together: Not on file    Attends religious service: Not on file    Active member of club or organization: Not on file    Attends meetings of clubs or organizations: Not on file    Relationship status: Not on file  Other Topics Concern  . Not on file  Social History Narrative  . Not on file   Additional  Social History:                         Sleep: Good  Appetite:  Good  Current Medications: Current Facility-Administered Medications  Medication Dose Route Frequency Provider Last Rate Last Dose  . acetaminophen (TYLENOL) tablet 650 mg  650 mg Oral Q6H PRN Laveda AbbeParks, Laurie Britton, NP   650 mg at 03/07/18 [id number]  . alum & mag hydroxide-simeth (MAALOX/MYLANTA) 200-200-20 MG/5ML suspension 30 mL  30 mL Oral Q4H PRN Laveda AbbeParks, Laurie Britton, NP      . chlordiazePOXIDE (LIBRIUM) capsule 25 mg  25 mg Oral Q6H PRN Toshio Slusher, Gerlene Burdockravis B, FNP   25 mg at 03/06/18 1211  . gabapentin (NEURONTIN) capsule 300 mg  300 mg Oral TID Laveda AbbeParks, Laurie Britton, NP   300 mg at 03/07/18 0810  . hydrOXYzine (ATARAX/VISTARIL) tablet 25 mg  25 mg Oral Q6H PRN Dairon Procter, Gerlene Burdockravis B, FNP   25 mg at 03/07/18 [id number]  . loperamide (IMODIUM) capsule 2-4 mg  2-4 mg Oral PRN Favio Moder, Gerlene Burdockravis B, FNP      . magnesium hydroxide (MILK OF MAGNESIA) suspension 30 mL  30 mL Oral Daily PRN Laveda AbbeParks, Laurie Britton, NP      . multivitamin with minerals tablet 1 tablet  1 tablet Oral Daily Altagracia Rone, Gerlene Burdockravis B, FNP   1 tablet at 03/07/18 [id number]  . naproxen (NAPROSYN) tablet 500 mg  500 mg Oral BID PRN Laveda AbbeParks, Laurie Britton, NP   500 mg at 03/06/18 1311  . nicotine (NICODERM CQ - dosed in mg/24 hours) patch 21 mg  21 mg Transdermal Daily Laveda AbbeParks, Laurie Britton, NP   21 mg at 03/07/18 0813  . ondansetron (ZOFRAN-ODT) disintegrating tablet 4 mg  4 mg Oral Q6H PRN Marybel Alcott, Gerlene Burdockravis B, FNP      . thiamine (VITAMIN B-1) tablet 100 mg  100 mg Oral Daily Randon Somera B, FNP   100 mg at 03/07/18 0810  . traZODone (DESYREL) tablet 50 mg  50 mg Oral QHS PRN Laveda AbbeParks, Laurie Britton, NP   50 mg at 03/06/18 2131    Lab Results: No results found for this or any previous visit (from the past 48 hour(s)).  Blood Alcohol level:  Lab Results  Component Value Date   ETH <10 03/04/2018   [facility]    <5        LOWEST DETECTABLE LIMIT FOR SERUM ALCOHOL IS 5 mg/dL FOR  MEDICAL PURPOSES ONLY    Metabolic Disorder Labs: No results found for: HGBA1C, MPG No results found for: PROLACTIN No results found for: CHOL, TRIG, HDL, CHOLHDL, VLDL, LDLCALC  Physical Findings: AIMS: Facial and Oral Movements Muscles of Facial Expression: None, normal Lips and Perioral Area: None, normal Jaw: None, normal Tongue: None, normal,Extremity Movements Upper (arms, wrists, hands, fingers): None, normal Lower (legs, knees, ankles, toes): None, normal, Trunk Movements Neck, shoulders, hips: None, normal, Overall Severity Severity of abnormal movements (highest score from questions above): None, normal Incapacitation due to abnormal movements: None, normal Patient's awareness of abnormal movements (rate only patient's report): No Awareness, Dental Status Current problems with teeth and/or dentures?: No Does patient usually wear dentures?: No  CIWA:  CIWA-Ar Total: 0 COWS:     Musculoskeletal: Strength & Muscle Tone: within normal limits Gait & Station: normal Patient leans: N/A  Psychiatric Specialty Exam: Physical Exam  Nursing note and vitals reviewed. Constitutional: She is oriented to person, place, and time. She appears well-developed and well-nourished.  Cardiovascular: Normal rate.  Respiratory: Effort normal.  Musculoskeletal: Normal range of motion.  Neurological: She is alert and oriented to person, place, and time.  Skin: Skin is warm.    Review of Systems  Constitutional: Negative.   HENT: Negative.   Eyes: Negative.   Respiratory: Negative.   Cardiovascular: Negative.  Gastrointestinal: Negative.   Genitourinary: Negative.   Musculoskeletal: Negative.   Skin: Negative.   Neurological: Negative.   Endo/Heme/Allergies: Negative.   Psychiatric/Behavioral: Positive for substance abuse.    Blood pressure 118/79, pulse 77, temperature 97.7 F (36.5 C), temperature source Oral, resp. rate 20, height 5\' 6"  (1.676 m), weight 103.9 kg (229 lb),  SpO2 100 %, unknown if currently breastfeeding.Body mass index is 36.96 kg/m.  General Appearance: Casual  Eye Contact:  Good  Speech:  Clear and Coherent and Normal Rate  Volume:  Normal  Mood:  Euthymic  Affect:  Congruent  Thought Process:  Goal Directed and Descriptions of Associations: Intact  Orientation:  Full (Time, Place, and Person)  Thought Content:  WDL  Suicidal Thoughts:  No  Homicidal Thoughts:  No  Memory:  Immediate;   Good Recent;   Good Remote;   Good  Judgement:  Fair  Insight:  Fair  Psychomotor Activity:  Normal  Concentration:  Concentration: Good and Attention Span: Good  Recall:  Good  Fund of Knowledge:  Good  Language:  Good  Akathisia:  No  Handed:  Right  AIMS (if indicated):     Assets:  Communication Skills Desire for Improvement Financial Resources/Insurance Housing Physical Health Social Support Transportation  ADL's:  Intact  Cognition:  WNL  Sleep:  Number of Hours: 6   Problems addressed Substance induced mood disorder Rule out bipolar 1 disorder  Treatment Plan Summary: Daily contact with patient to assess and evaluate symptoms and progress in treatment, Medication management and Plan is to: -Continue Neurontin 300 mg p.o. 3 times daily for withdrawal symptoms -Continue Vistaril 25 mg p.o. every 6 hours as needed for anxiety or she will score greater than 10 -Continue Librium as needed detox protocol -Continue trazodone 50 mg p.o. nightly as needed for insomnia -Encourage group therapy participation -Monitor patient for manic symptoms.  Gerlene Burdock Lawayne Hartig, FNP 03/07/2018, 9:55 AM
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
## 2018-03-07 NOTE — [facility] Group Notes (Signed)
[facility] Group Notes:  (Nursing/MHT/Case Management/Adjunct)  Date:  03/07/2018  Time:  2:04 PM Type of Therapy:  Psychoeducational Skills  Participation Level:  Active  Participation Quality:  Appropriate  Affect:  Appropriate  Cognitive:  Appropriate  Insight:  Appropriate  Engagement in Group:  Engaged  Modes of Intervention:  Problem-solving  Summary of Progress/Problems: Topic was on therapeutic relaxation.   Bethann PunchesJane O Atheena Spano 03/07/2018, 2:04 PM

## 2018-03-08 DIAGNOSIS — F17210 Nicotine dependence, cigarettes, uncomplicated: Secondary | ICD-10-CM | POA: Diagnosis not present

## 2018-03-08 DIAGNOSIS — F1994 Other psychoactive substance use, unspecified with psychoactive substance-induced mood disorder: Secondary | ICD-10-CM | POA: Diagnosis not present

## 2018-03-08 NOTE — Tx Team (Signed)
Interdisciplinary Treatment and Diagnostic Plan Update  03/08/2018 Time of Session: 0830AM Cynthia Odonnell MRN: [id number]  Principal Diagnosis: Substance induced mood disorder (HCC)  Secondary Diagnoses: Principal Problem:   Substance induced mood disorder (HCC) Active Problems:   Bipolar 1 disorder, mixed, severe (HCC)   Current Medications:  Current Facility-Administered Medications  Medication Dose Route Frequency Provider Last Rate Last Dose  . acetaminophen (TYLENOL) tablet 650 mg  650 mg Oral Q6H PRN Laveda AbbeParks, Laurie Britton, NP   650 mg at 03/07/18 1816  . alum & mag hydroxide-simeth (MAALOX/MYLANTA) 200-200-20 MG/5ML suspension 30 mL  30 mL Oral Q4H PRN Laveda AbbeParks, Laurie Britton, NP      . chlordiazePOXIDE (LIBRIUM) capsule 25 mg  25 mg Oral Q6H PRN Money, Gerlene Burdockravis B, FNP   25 mg at 03/07/18 2130  . gabapentin (NEURONTIN) capsule 300 mg  300 mg Oral TID Laveda AbbeParks, Laurie Britton, NP   300 mg at 03/08/18 0815  . hydrOXYzine (ATARAX/VISTARIL) tablet 25 mg  25 mg Oral Q6H PRN Money, Gerlene Burdockravis B, FNP   25 mg at 03/07/18 1816  . loperamide (IMODIUM) capsule 2-4 mg  2-4 mg Oral PRN Money, Gerlene Burdockravis B, FNP      . magnesium hydroxide (MILK OF MAGNESIA) suspension 30 mL  30 mL Oral Daily PRN Laveda AbbeParks, Laurie Britton, NP   30 mL at 03/08/18 0818  . multivitamin with minerals tablet 1 tablet  1 tablet Oral Daily Money, Gerlene Burdockravis B, FNP   1 tablet at 03/08/18 0815  . naproxen (NAPROSYN) tablet 500 mg  500 mg Oral BID PRN Laveda AbbeParks, Laurie Britton, NP   500 mg at 03/06/18 1311  . nicotine (NICODERM CQ - dosed in mg/24 hours) patch 21 mg  21 mg Transdermal Daily Laveda AbbeParks, Laurie Britton, NP   21 mg at 03/08/18 0815  . ondansetron (ZOFRAN-ODT) disintegrating tablet 4 mg  4 mg Oral Q6H PRN Money, Gerlene Burdockravis B, FNP      . thiamine (VITAMIN B-1) tablet 100 mg  100 mg Oral Daily Money, Gerlene Burdockravis B, FNP   100 mg at 03/08/18 0815  . traZODone (DESYREL) tablet 50 mg  50 mg Oral QHS PRN Laveda AbbeParks, Laurie Britton, NP   50 mg at 03/07/18 2130    PTA Medications: Medications Prior to Admission  Medication Sig Dispense Refill Last Dose  . etonogestrel (NEXPLANON) 68 MG IMPL implant Inject 66 mg into the skin.       Patient Stressors: Marital or family conflict Substance abuse  Patient Strengths: Ability for insight Average or above average intelligence Communication skills Motivation for treatment/growth Physical Health Supportive family/friends  Treatment Modalities: Medication Management, Group therapy, Case management,  1 to 1 session with clinician, Psychoeducation, Recreational therapy.   Physician Treatment Plan for Primary Diagnosis: Substance induced mood disorder (HCC) Long Term Goal(s): Improvement in symptoms so as ready for discharge Improvement in symptoms so as ready for discharge   Short Term Goals: Ability to identify changes in lifestyle to reduce recurrence of condition will improve Ability to verbalize feelings will improve Ability to disclose and discuss suicidal ideas Ability to demonstrate self-control will improve Ability to identify and develop effective coping behaviors will improve Ability to maintain clinical measurements within normal limits will improve Compliance with prescribed medications will improve Ability to identify triggers associated with substance abuse/mental health issues will improve  Medication Management: Evaluate patient's response, side effects, and tolerance of medication regimen.  Therapeutic Interventions: 1 to 1 sessions, Unit Group sessions and Medication administration.  Evaluation of Outcomes:  Progressing  Physician Treatment Plan for Secondary Diagnosis: Principal Problem:   Substance induced mood disorder (HCC) Active Problems:   Bipolar 1 disorder, mixed, severe (HCC)  Long Term Goal(s): Improvement in symptoms so as ready for discharge Improvement in symptoms so as ready for discharge   Short Term Goals: Ability to identify changes in lifestyle to  reduce recurrence of condition will improve Ability to verbalize feelings will improve Ability to disclose and discuss suicidal ideas Ability to demonstrate self-control will improve Ability to identify and develop effective coping behaviors will improve Ability to maintain clinical measurements within normal limits will improve Compliance with prescribed medications will improve Ability to identify triggers associated with substance abuse/mental health issues will improve     Medication Management: Evaluate patient's response, side effects, and tolerance of medication regimen.  Therapeutic Interventions: 1 to 1 sessions, Unit Group sessions and Medication administration.  Evaluation of Outcomes: Progressing   RN Treatment Plan for Primary Diagnosis: Substance induced mood disorder (HCC) Long Term Goal(s): Knowledge of disease and therapeutic regimen to maintain health will improve  Short Term Goals: Ability to remain free from injury will improve, Ability to verbalize feelings will improve, Ability to disclose and discuss suicidal ideas and Ability to identify and develop effective coping behaviors will improve  Medication Management: RN will administer medications as ordered by provider, will assess and evaluate patient's response and provide education to patient for prescribed medication. RN will report any adverse and/or side effects to prescribing provider.  Therapeutic Interventions: 1 on 1 counseling sessions, Psychoeducation, Medication administration, Evaluate responses to treatment, Monitor vital signs and CBGs as ordered, Perform/monitor CIWA, COWS, AIMS and Fall Risk screenings as ordered, Perform wound care treatments as ordered.  Evaluation of Outcomes: Progressing   LCSW Treatment Plan for Primary Diagnosis: Substance induced mood disorder (HCC) Long Term Goal(s): Safe transition to appropriate next level of care at discharge, Engage patient in therapeutic group addressing  interpersonal concerns.  Short Term Goals: Engage patient in aftercare planning with referrals and resources, Facilitate patient progression through stages of change regarding substance use diagnoses and concerns and Identify triggers associated with mental health/substance abuse issues  Therapeutic Interventions: Assess for all discharge needs, 1 to 1 time with Social worker, Explore available resources and support systems, Assess for adequacy in community support network, Educate family and significant other(s) on suicide prevention, Complete Psychosocial Assessment, Interpersonal group therapy.  Evaluation of Outcomes: Progressing   Progress in Treatment: Attending groups: Yes. Participating in groups: Yes. Taking medication as prescribed: Yes. Toleration medication: Yes. Family/Significant other contact made: No, will contact:  pt's husband with pt's consent Patient understands diagnosis: Yes. Discussing patient identified problems/goals with staff: Yes. Medical problems stabilized or resolved: Yes. Denies suicidal/homicidal ideation: Yes. Issues/concerns per patient self-inventory: No. Other: n/a   New problem(s) identified: No, Describe:  n/a  New Short Term/Long Term Goal(s): detox, medication management for mood stabilization; elimination of SI thoughts; development of comprehensive mental wellness/sobriety plan.   Patient Goals:  "I need to learn better coping skills."   Discharge Plan or Barriers: CSW assessing for appropriate referrals. Pt interested in [facility] referral. [facility] pamphlet, Mobile Crisis information, and AA/NA information provided to patient for additional community support and resources.   Reason for Continuation of Hospitalization: Anxiety Depression Medication stabilization Withdrawal symptoms  Estimated Length of Stay: Thursday, 03/11/18  Attendees: Patient: 03/08/2018 8:57 AM  Physician:Dr. Jola Babinski MD; Dr. Altamese Hoyt Lakes MD  03/08/2018 8:57 AM  Nursing: Meriam Sprague RN;  Casimiro Needle RN 03/08/2018 8:57 AM  RN Care Manager:x 03/08/2018 8:57 AM  Social Worker: Corrie Mckusick LCSW 03/08/2018 8:57 AM  Recreational Therapist:  03/08/2018 8:57 AM  Other: Renold Don NP; Hillery Jacks NP 03/08/2018 8:57 AM  Other:  03/08/2018 8:57 AM  Other: 03/08/2018 8:57 AM    Scribe for Treatment Team: Rona Ravens, LCSW 03/08/2018 8:57 AM

## 2018-03-08 NOTE — Progress Notes (Signed)
[facility] MD Progress Note  03/08/2018 1:13 PM Cynthia Odonnell  MRN:  [id number] [facility]ive[facility]  Patient is a 32 year old female with a past psychiatric history significant for either schizoaffective disorder versus bipolar disorder, cocaine dependence, alcohol use disorder and marijuana use disorder who was admitted on 03/06/2018.  She is significantly sedated today.  It looks like she must of received some Librium.  She is having to hold onto the handrail because of the oversedation.  She is very lethargic.  Her vital signs are stable.  We discussed stopping the Librium.  I reviewed the progress note from yesterday, no new medications were added.  She denied any suicidal ideation.  She was seen by social work this morning. An [facility] referral was faxed.  Patient was also given information about [facility].  The patient stated she was able to return home [facility] not [facility].  Her other medications include gabapentin 300 p.o. 3 times daily, hydroxyzine, thiamine and trazodone. Principal Problem: Substance induced mood disorder (HCC) Diagnosis:   Patient Active Problem List   Diagnosis Date Noted  . Substance induced mood disorder (HCC) [F19.94] 03/06/2018  . Bipolar 1 disorder, mixed, severe (HCC) [F31.63] 03/05/2018  . Bipolar affective disorder (HCC) [F31.9]   . Bradycardia [R00.1]   . Cocaine abuse (HCC) [F14.10]   . Hypotension [I95.9]   . Suicide attempt (HCC) [T14.91XA]   . H/O cesarean section [Z98.891] 11/04/2014  . [redacted] weeks gestation of pregnancy [Z3A.37]   . Maternal morbid obesity in third trimester, antepartum (HCC) [W09.811[O99.213, E66.01]   . Excessive weight gain in pregnancy in second trimester [O26.02]   . [redacted] weeks gestation of pregnancy [Z3A.33]   . Smoking [F17.200]   . Poor fetal growth affecting management of mother in third trimester, antepartum [O36.5930]   . [redacted] weeks gestation of pregnancy [Z3A.30]   . Maternal morbid obesity in second  trimester, antepartum (HCC) [B14.782[O99.212, E66.01]   . [redacted] weeks gestation of pregnancy [Z3A.24]   . Evaluate anatomy not seen on prior sonogram [Z04.89]   . Tobacco smoking affecting pregnancy in second trimester, antepartum [O99.332]   . Obesity affecting pregnancy in second trimester [O99.212]   . Uterine scar from previous cesarean delivery, antepartum complication [O34.219]   . Encounter for fetal anatomic survey [Z36.89]   . [redacted] weeks gestation of pregnancy [Z3A.20]    Total Time spent with patient: 15 minutes  Past Psychiatric History: See admission H&P  Past Medical History:  Past Medical History:  Diagnosis Date  . Abnormal Pap smear of cervix   . Anxiety   . Arthritis   . Bronchitis    Hx - uses albuterol inhaler prn  . Cocaine abuse, in remission (HCC)   . Depression   . GERD (gastroesophageal reflux disease)    zantac prn  . Headache(784.0)    otc prn  . Optic neuritis    left eye  . Smoker   . Termination of pregnancy (fetus)    x 2    Past Surgical History:  Procedure Laterality Date  . CESAREAN SECTION  07/31/2008   [facility]  . CESAREAN SECTION N/A 11/04/2014   Procedure: CESAREAN SECTION;  Surgeon: Tracey Harrieshomas Henley, MD;  Location: [facility];  Service: Obstetrics;  Laterality: N/A;  . COLPOSCOPY    . WISDOM TOOTH EXTRACTION     Family History:  Family History  Problem Relation Age of Onset  . Other Neg Hx    Family Psychiatric  History: See admission H&P Social History:  Social History   Substance and Sexual Activity  Alcohol Use No     Social History   Substance and Sexual Activity  Drug Use Yes  . Frequency: 3.0 times per week  . Types: Cocaine   Comment: states "usually 2 grams at at time"    Social History   Socioeconomic History  . Marital status: Married    Spouse name: Not on file  . Number of children: Not on file  . Years of education: Not on file  . Highest education level: Not on file  Occupational History  . Not on file  Social Needs  .  Financial resource strain: Not on file  . Food insecurity:    Worry: Not on file    Inability: Not on file  . Transportation needs:    Medical: Not on file    Non-medical: Not on file  Tobacco Use  . Smoking status: Current Every Day Smoker    Packs/day: 1.00    Years: 16.00    Pack years: 16.00    Types: Cigarettes  . Smokeless tobacco: Never Used  Substance and Sexual Activity  . Alcohol use: No  . Drug use: Yes    Frequency: 3.0 times per week    Types: Cocaine    Comment: states "usually 2 grams at at time"  . Sexual activity: Not on file  Lifestyle  . Physical activity:    Days per week: Not on file    Minutes per session: Not on file  . Stress: Not on file  Relationships  . Social connections:    Talks on phone: Not on file    Gets together: Not on file    Attends religious service: Not on file    Active member of club or organization: Not on file    Attends meetings of clubs or organizations: Not on file    Relationship status: Not on file  Other Topics Concern  . Not on file  Social History Narrative  . Not on file   Additional Social History:                         Sleep: Good  Appetite:  Fair  Current Medications: Current Facility-Administered Medications  Medication Dose Route Frequency Provider Last Rate Last Dose  . acetaminophen (TYLENOL) tablet 650 mg  650 mg Oral Q6H PRN Laveda Abbe, NP   650 mg at 03/07/18 1816  . alum & mag hydroxide-simeth (MAALOX/MYLANTA) 200-200-20 MG/5ML suspension 30 mL  30 mL Oral Q4H PRN Laveda Abbe, NP      . chlordiazePOXIDE (LIBRIUM) capsule 25 mg  25 mg Oral Q6H PRN Money, Gerlene Burdock, FNP   25 mg at 03/07/18 2130  . gabapentin (NEURONTIN) capsule 300 mg  300 mg Oral TID Laveda Abbe, NP   Stopped at 03/08/18 1214  . hydrOXYzine (ATARAX/VISTARIL) tablet 25 mg  25 mg Oral Q6H PRN Money, Gerlene Burdock, FNP   25 mg at 03/07/18 1816  . loperamide (IMODIUM) capsule 2-4 mg  2-4 mg Oral PRN  Money, Gerlene Burdock, FNP      . magnesium hydroxide (MILK OF MAGNESIA) suspension 30 mL  30 mL Oral Daily PRN Laveda Abbe, NP   30 mL at 03/08/18 0818  . multivitamin with minerals tablet 1 tablet  1 tablet Oral Daily Money, Gerlene Burdock, FNP   1 tablet at 03/08/18 0815  . naproxen (  NAPROSYN) tablet 500 mg  500 mg Oral BID PRN Laveda Abbe, NP   500 mg at 03/06/18 1311  . nicotine (NICODERM CQ - dosed in mg/24 hours) patch 21 mg  21 mg Transdermal Daily Laveda Abbe, NP   21 mg at 03/08/18 0815  . ondansetron (ZOFRAN-ODT) disintegrating tablet 4 mg  4 mg Oral Q6H PRN Money, Gerlene Burdock, FNP      . thiamine (VITAMIN B-1) tablet 100 mg  100 mg Oral Daily Money, Gerlene Burdock, FNP   100 mg at 03/08/18 0815  . traZODone (DESYREL) tablet 50 mg  50 mg Oral QHS PRN Laveda Abbe, NP   50 mg at 03/07/18 2130    Lab Results: No results found for this or any previous visit (from the past 48 hour(s)).  Blood Alcohol level:  Lab Results  Component Value Date   ETH <10 03/04/2018   [facility]  01/28/2009    <5        LOWEST DETECTABLE LIMIT FOR SERUM ALCOHOL IS 5 mg/dL FOR MEDICAL PURPOSES ONLY    Metabolic Disorder Labs: No results found for: HGBA1C, MPG No results found for: PROLACTIN No results found for: CHOL, TRIG, HDL, CHOLHDL, VLDL, LDLCALC  Physical Findings: AIMS: Facial and Oral Movements Muscles of Facial Expression: None, normal Lips and Perioral Area: None, normal Jaw: None, normal Tongue: None, normal,Extremity Movements Upper (arms, wrists, hands, fingers): None, normal Lower (legs, knees, ankles, toes): None, normal, Trunk Movements Neck, shoulders, hips: None, normal, Overall Severity Severity of abnormal movements (highest score from questions above): None, normal Incapacitation due to abnormal movements: None, normal Patient's awareness of abnormal movements (rate only patient's report): No Awareness, Dental Status Current problems with teeth and/or  dentures?: No Does patient usually wear dentures?: No  CIWA:  CIWA-Ar Total: 10 COWS:     Musculoskeletal: Strength & Muscle Tone: within normal limits Gait & Station: normal Patient leans: Right  Psychiatric Specialty Exam: Physical Exam  Nursing note and vitals reviewed. Constitutional: She is oriented to person, place, and time. She appears well-developed and well-nourished.  HENT:  Head: Normocephalic and atraumatic.  Respiratory: Effort normal.  Neurological: She is alert and oriented to person, place, and time.    ROS  Blood pressure 103/73, pulse 87, temperature 98.3 F (36.8 C), temperature source Oral, resp. rate 20, height 5\' 6"  (1.676 m), weight 103.9 kg (229 lb), SpO2 100 %, unknown if currently breastfeeding.Body mass index is 36.96 kg/m.  General Appearance: Disheveled  Eye Contact:  Fair  Speech:  Slow  Volume:  Decreased  Mood:  Sedated  Affect:  Congruent  Thought Process:  Coherent  Orientation:  Full (Time, Place, and Person)  Thought Content:  Logical  Suicidal Thoughts:  No  Homicidal Thoughts:  No  Memory:  Immediate;   Fair Recent;   Fair Remote;   Fair  Judgement:  Impaired  Insight:  Fair  Psychomotor Activity:  Decreased and Psychomotor Retardation  Concentration:  Concentration: Poor and Attention Span: Poor  Recall:  [employer] of Knowledge:  Fair  Language:  Fair  Akathisia:  Negative  Handed:  Right  AIMS (if indicated):     Assets:  Housing Physical Health Resilience  ADL's:  Intact  Cognition:  WNL  Sleep:  Number of Hours: 5.25     Treatment Plan Summary: Daily contact with patient to assess and evaluate symptoms and progress in treatment, Medication management and Plan Patient is seen and examined.  Patient is  a 32 year old female with the above-stated past psychiatric history seen in follow-up.  She is significantly sedated today and lethargic.  I am going to stop the Librium today.  We will continue the gabapentin,  hydroxyzine, thiamine and trazodone.  If she remains this sedated we need to remove other medications.  She has been seen by social work and a [facility] furl has been made.  We will have to wait to see what happens with that.  No other changes in her medications today.  Antonieta Pert, MD 03/08/2018, 1:13 PM

## 2018-03-08 NOTE — Progress Notes (Signed)
D   Pt has been hyper this evening   She is intrusive and talkative    Flitting from one conversation and interaction to another   She is overly  bright and pleasant  A   Verbal support and redirection given   Medications administered and effectiveness monitored    Q 15 min checks  R   Pt remains safe and is receptive to redirection

## 2018-03-08 NOTE — Progress Notes (Signed)
Adult Psychoeducational Group Note  Date:  03/08/2018 Time:  10:09 AM  Group Topic/Focus:  Goals Group:   The focus of this group is to help patients establish daily goals to achieve during treatment and discuss how the patient can incorporate goal setting into their daily lives to aide in recovery.  Participation Level:  Active  Participation Quality:  Appropriate  Affect:  Appropriate  Cognitive:  Alert  Insight: Appropriate  Engagement in Group:  Engaged  Modes of Intervention:  Discussion  Additional Comments:  Pt did participate in morning goals group today.  Pt states that she would like to talk to the social worker about treatment options.  Selden Noteboom R Natlie Asfour 03/08/2018, 10:09 AM

## 2018-03-08 NOTE — Progress Notes (Signed)
Recreation Therapy Notes  Date: 8.5.19 Time: 0930 Location: [address] Dayroom  Group Topic: Stress Management  Goal Area(s) Addresses:  Patient will verbalize importance of using healthy stress management.  Patient will identify positive emotions associated with healthy stress management.   Intervention: Stress Management  Activity : Guided Imagery.  LRT introduced the stress management technique of guided imagery.  LRT read a script to lead patients on a journey through a meadow.  Patients were to follow along as script was read.  Education: Stress Management, Discharge Planning.   Education Outcome: Acknowledges edcuation/In group clarification offered/Needs additional education  Clinical Observations/Feedback: Pt did not attend group.    Cynthia Odonnell, LRT/CTRS         Cynthia Odonnell A 03/08/2018 11:20 AM 

## 2018-03-08 NOTE — Progress Notes (Signed)
[facility] referral faxed this morning per pt request. CSW informed pt that it is [facility] due to waitlist. Pt given [facility]. Pt is able to return home if [facility], but was hoping not to go home until she completes treatment. "My kids are young and don't understand that I have to go again to treatment if I return home first."  MidlothianHeather S. Alan RipperHolloway, MSW, LCSW Clinical Social Worker 03/08/2018 11:00 AM

## 2018-03-09 ENCOUNTER — Encounter (HOSPITAL_COMMUNITY): Payer: Self-pay | Admitting: Behavioral Health

## 2018-03-09 DIAGNOSIS — F419 Anxiety disorder, unspecified: Secondary | ICD-10-CM | POA: Diagnosis not present

## 2018-03-09 DIAGNOSIS — F1994 Other psychoactive substance use, unspecified with psychoactive substance-induced mood disorder: Secondary | ICD-10-CM | POA: Diagnosis not present

## 2018-03-09 DIAGNOSIS — F17210 Nicotine dependence, cigarettes, uncomplicated: Secondary | ICD-10-CM | POA: Diagnosis not present

## 2018-03-09 MED ORDER — HYDROXYZINE HCL 50 MG PO TABS
50.0000 mg | ORAL_TABLET | Freq: Four times a day (QID) | ORAL | Status: DC | PRN
  Administered 2018-03-09 – 2018-03-12 (×8): 50 mg via ORAL
  Filled 2018-03-09 (×8): qty 1

## 2018-03-09 NOTE — Plan of Care (Signed)
Pt progressing in the following metrics  D: pt presented to the medication window; pleasant, talkative, compliant with medications. Pt states she slept ok last night. Pt rates her depression/hopelessness/anxiety a 5/0/3 out of 10 respectively. Pt complains of pain at a 5/10 and being extremely hyperactive. Pt requested medication for this. Pt informed to speak to the provider today. Pt denies any si/hi/ah/vh and verbally agrees to approach staff if these become apparent. Pt states her goal for today is to not cuss and she will achieve this by trying not to cuss. Pt is also requesting to discharge.  A: pt provided support and encouragement. Pt given medications per protocol and standing orders. [id number] safety checks implemented and continued. R: pt safe on the unit will continue to monitor.   Problem: Education: Goal: Emotional status will improve Outcome: Progressing Goal: Mental status will improve Outcome: Progressing Goal: Verbalization of understanding the information provided will improve Outcome: Progressing   Problem: Activity: Goal: Sleeping patterns will improve Outcome: Progressing   Problem: Coping: Goal: Ability to demonstrate self-control will improve Outcome: Progressing   Problem: Health Behavior/Discharge Planning: Goal: Compliance with treatment plan for underlying cause of condition will improve Outcome: Progressing   Problem: Physical Regulation: Goal: Ability to maintain clinical measurements within normal limits will improve Outcome: Progressing   Problem: Education: Goal: Knowledge of [place] [facility] Education information/materials will improve Outcome: Progressing   Problem: Activity: Goal: Interest or engagement in activities will improve Outcome: Progressing Goal: Sleeping patterns will improve Outcome: Progressing   Problem: Health Behavior/Discharge Planning: Goal: Compliance with treatment plan for underlying cause of condition will  improve Outcome: Progressing   Problem: Safety: Goal: Periods of time without injury will increase Outcome: Progressing

## 2018-03-09 NOTE — Progress Notes (Signed)
D   Pt has been hyper this evening   She is intrusive and talkative    Flitting from one conversation and interaction to another   She is overly  bright and pleasant   She expresses worry over when she is discharged if she cant go straight to rehab she feels like she will start using again A   Verbal support and redirection given   Medications administered and effectiveness monitored    Q 15 min checks   Encourage pt to continue to [facility]  R   Pt remains safe and is receptive to redirection

## 2018-03-09 NOTE — Progress Notes (Addendum)
[facility] MD Progress Note  03/09/2018 10:01 AM Cynthia Odonnell  MRN:  [id number]  Subjective: " I would like to talk to the doctor. I am doing better but feel really hyper. I think I may have ADHD."    Objective: Patient is seen and examined, case discussed with treatment team and chart reviewed. Patient is a 32 year old female with a past psychiatric history significant for either schizoaffective disorder versus bipolar disorder, cocaine dependence, alcohol use disorder and marijuana use disorder who was admitted on 03/06/2018.   On assessment patient is alert and oriented x4, very pleasant and cooperative. She is very talkative and as per nursing, intrusive at times. Endorse she is feeling hyper/anxious and has requested to be started on medication such as Concerta. She was advised that no stimulant medications would be started and her ADHD has not been documented. She denies any active or passive SI, HI or AVH and does not appear internally preoccupied. She denies depressed mood with associated symptoms. Denies homicidal thoughts. She has been attending groups and has been interacting with peers and staff appropriately. Reports she would like to start medication for Bipolar although it is difficult to determine if this is substance induced mood disorder vs Bipolar 1 disorder. She endorses withdrawal symptoms and describes symptoms as diarrhea, runny nose, headache although she has denied these symptoms to staff. She does not appear to be in a withdrawal state. She continues to endorse the desire to follow up treatment for her substance use and CSW continues to work with patient on this disposition. To note, patient advised that it is unlikely to get admitted to [facility] directly from the hospital due to waitlist. Pt given information about ADS for [facility] and [facility]. Pt is able to return home if she is not accepted to [facility]. She denies concerns with appetite, resting pattern or current medications. At  this time, she is contracting for safety on the unit.     Principal Problem: Substance induced mood disorder (HCC) Diagnosis:   Patient Active Problem List   Diagnosis Date Noted  . Substance induced mood disorder (HCC) [F19.94] 03/06/2018  . Bipolar 1 disorder, mixed, severe (HCC) [F31.63] 03/05/2018  . Bipolar affective disorder (HCC) [F31.9]   . Bradycardia [R00.1]   . Cocaine abuse (HCC) [F14.10]   . Hypotension [I95.9]   . Suicide attempt (HCC) [T14.91XA]   . H/O cesarean section [Z98.891] 11/04/2014  . [redacted] weeks gestation of pregnancy [Z3A.37]   . Maternal morbid obesity in third trimester, antepartum (HCC) [A21.308, E66.01]   . Excessive weight gain in pregnancy in second trimester [O26.02]   . [redacted] weeks gestation of pregnancy [Z3A.33]   . Smoking [F17.200]   . Poor fetal growth affecting management of mother in third trimester, antepartum [O36.5930]   . [redacted] weeks gestation of pregnancy [Z3A.30]   . Maternal morbid obesity in second trimester, antepartum (HCC) [M57.846, E66.01]   . [redacted] weeks gestation of pregnancy [Z3A.24]   . Evaluate anatomy not seen on prior sonogram [Z04.89]   . Tobacco smoking affecting pregnancy in second trimester, antepartum [O99.332]   . Obesity affecting pregnancy in second trimester [O99.212]   . Uterine scar from previous cesarean delivery, antepartum complication [O34.219]   . Encounter for fetal anatomic survey [Z36.89]   . [redacted] weeks gestation of pregnancy [Z3A.20]    Total Time spent with patient: 20 minutes  Past Psychiatric History: See admission H&P  Past Medical History:  Past Medical History:  Diagnosis Date  .  Abnormal Pap smear of cervix   . Anxiety   . Arthritis   . Bronchitis    Hx - uses albuterol inhaler prn  . Cocaine abuse, in remission (HCC)   . Depression   . GERD (gastroesophageal reflux disease)    zantac prn  . Headache(784.0)    otc prn  . Optic neuritis    left eye  . Smoker   . Termination of pregnancy (fetus)     x 2    Past Surgical History:  Procedure Laterality Date  . CESAREAN SECTION  07/31/2008   [facility]  . CESAREAN SECTION N/A 11/04/2014   Procedure: CESAREAN SECTION;  Surgeon: Tracey Harrieshomas Henley, MD;  Location: [facility]S;  Service: Obstetrics;  Laterality: N/A;  . COLPOSCOPY    . WISDOM TOOTH EXTRACTION     Family History:  Family History  Problem Relation Age of Onset  . Other Neg Hx    Family Psychiatric  History: See admission H&P Social History:  Social History   Substance and Sexual Activity  Alcohol Use No     Social History   Substance and Sexual Activity  Drug Use Yes  . Frequency: 3.0 times per week  . Types: Cocaine   Comment: states "usually 2 grams at at time"    Social History   Socioeconomic History  . Marital status: Married    Spouse name: Not on file  . Number of children: Not on file  . Years of education: Not on file  . Highest education level: Not on file  Occupational History  . Not on file  Social Needs  . Financial resource strain: Not on file  . Food insecurity:    Worry: Not on file    Inability: Not on file  . Transportation needs:    Medical: Not on file    Non-medical: Not on file  Tobacco Use  . Smoking status: Current Every Day Smoker    Packs/day: 1.00    Years: 16.00    Pack years: 16.00    Types: Cigarettes  . Smokeless tobacco: Never Used  Substance and Sexual Activity  . Alcohol use: No  . Drug use: Yes    Frequency: 3.0 times per week    Types: Cocaine    Comment: states "usually 2 grams at at time"  . Sexual activity: Not on file  Lifestyle  . Physical activity:    Days per week: Not on file    Minutes per session: Not on file  . Stress: Not on file  Relationships  . Social connections:    Talks on phone: Not on file    Gets together: Not on file    Attends religious service: Not on file    Active member of club or organization: Not on file    Attends meetings of clubs or organizations: Not on file    Relationship  status: Not on file  Other Topics Concern  . Not on file  Social History Narrative  . Not on file   Additional Social History:                         Sleep: Good  Appetite:  Fair  Current Medications: Current Facility-Administered Medications  Medication Dose Route Frequency Provider Last Rate Last Dose  . acetaminophen (TYLENOL) tablet 650 mg  650 mg Oral Q6H PRN Laveda AbbeParks, Laurie Britton, NP   650 mg at 03/07/18 1816  . alum & mag hydroxide-simeth (MAALOX/MYLANTA) 200-200-20  MG/5ML suspension 30 mL  30 mL Oral Q4H PRN Laveda Abbe, NP      . gabapentin (NEURONTIN) capsule 300 mg  300 mg Oral TID Laveda Abbe, NP   300 mg at 03/09/18 0735  . hydrOXYzine (ATARAX/VISTARIL) tablet 25 mg  25 mg Oral Q6H PRN Money, Gerlene Burdock, FNP   25 mg at 03/08/18 2216  . loperamide (IMODIUM) capsule 2-4 mg  2-4 mg Oral PRN Money, Gerlene Burdock, FNP      . magnesium hydroxide (MILK OF MAGNESIA) suspension 30 mL  30 mL Oral Daily PRN Laveda Abbe, NP   30 mL at 03/09/18 0748  . multivitamin with minerals tablet 1 tablet  1 tablet Oral Daily Money, Gerlene Burdock, FNP   1 tablet at 03/09/18 7829  . naproxen (NAPROSYN) tablet 500 mg  500 mg Oral BID PRN Laveda Abbe, NP   500 mg at 03/06/18 1311  . nicotine (NICODERM CQ - dosed in mg/24 hours) patch 21 mg  21 mg Transdermal Daily Laveda Abbe, NP   21 mg at 03/09/18 0735  . ondansetron (ZOFRAN-ODT) disintegrating tablet 4 mg  4 mg Oral Q6H PRN Money, Gerlene Burdock, FNP      . thiamine (VITAMIN B-1) tablet 100 mg  100 mg Oral Daily Money, Gerlene Burdock, FNP   100 mg at 03/09/18 0735  . traZODone (DESYREL) tablet 50 mg  50 mg Oral QHS PRN Laveda Abbe, NP   50 mg at 03/08/18 2215    Lab Results: No results found for this or any previous visit (from the past 48 hour(s)).  Blood Alcohol level:  Lab Results  Component Value Date   ETH <10 03/04/2018   [facility]  01/28/2009    <5        LOWEST DETECTABLE LIMIT FOR SERUM  ALCOHOL IS 5 mg/dL FOR MEDICAL PURPOSES ONLY    Metabolic Disorder Labs: No results found for: HGBA1C, MPG No results found for: PROLACTIN No results found for: CHOL, TRIG, HDL, CHOLHDL, VLDL, LDLCALC  Physical Findings: AIMS: Facial and Oral Movements Muscles of Facial Expression: None, normal Lips and Perioral Area: None, normal Jaw: None, normal Tongue: None, normal,Extremity Movements Upper (arms, wrists, hands, fingers): None, normal Lower (legs, knees, ankles, toes): None, normal, Trunk Movements Neck, shoulders, hips: None, normal, Overall Severity Severity of abnormal movements (highest score from questions above): None, normal Incapacitation due to abnormal movements: None, normal Patient's awareness of abnormal movements (rate only patient's report): No Awareness, Dental Status Current problems with teeth and/or dentures?: No Does patient usually wear dentures?: No  CIWA:  CIWA-Ar Total: 10 COWS:     Musculoskeletal: Strength & Muscle Tone: within normal limits Gait & Station: normal Patient leans: Right  Psychiatric Specialty Exam: Physical Exam  Nursing note and vitals reviewed. Constitutional: She is oriented to person, place, and time. She appears well-developed and well-nourished.  HENT:  Head: Normocephalic and atraumatic.  Respiratory: Effort normal.  Neurological: She is alert and oriented to person, place, and time.    Review of Systems  Psychiatric/Behavioral: Positive for substance abuse. Negative for depression, hallucinations, memory loss and suicidal ideas. The patient is nervous/anxious. The patient does not have insomnia.     Blood pressure (!) 119/97, pulse (!) 106, temperature 98.1 F (36.7 C), temperature source Oral, resp. rate 20, height 5\' 6"  (1.676 m), weight 103.9 kg (229 lb), SpO2 100 %, unknown if currently breastfeeding.Body mass index is 36.96 kg/m.  General Appearance: Disheveled  Eye Contact:  Fair  Speech:  Clear and Coherent  and Normal Rate  Volume:  Normal  Mood:  Anxious  Affect:  Congruent  Thought Process:  Coherent  Orientation:  Full (Time, Place, and Person)  Thought Content:  Logical. No AVH, ruminations or preoccupations   Suicidal Thoughts:  No  Homicidal Thoughts:  No  Memory:  Immediate;   Fair Recent;   Fair Remote;   Fair  Judgement:  Impaired  Insight:  Fair  Psychomotor Activity:  Decreased and Psychomotor Retardation  Concentration:  Concentration: Poor and Attention Span: Poor  Recall:  [employer] of Knowledge:  Fair  Language:  Fair  Akathisia:  Negative  Handed:  Right  AIMS (if indicated):     Assets:  Housing Physical Health Resilience  ADL's:  Intact  Cognition:  WNL  Sleep:  Number of Hours: 4.25     Treatment Plan Summary: Reviewed current treatment plan. Will continue the following plan with adjustments where noted;  Daily contact with patient to assess and evaluate symptoms and progress in treatment.  Medication management and Plan Patient is seen and examined.  Patient is a 32 year old female with the above-stated past psychiatric history seen in follow-up. She denies feelings or symptoms of depression although notes increased anxiety and hyperactivity. She reports she would like to start medication for ADHD although she has no previous diagnosis of ADHD and none are observed. She denies SI, HI or AVH and does not appear internally preoccupied.  Continued  Gabapentin 300 mg po TID for alcohol dependence and anxiety, Increased Hydroxyzine to 50 mg po TID as needed for anxiety, thiamine 100 mg po daily for alcohol dependence,  and trazodone.50 mg po daily at bedtime as needed for insomnia.  Patient denies feeling over sedated and endorses feeling more energetic.CSW will continue to work on discharge disposition.     Denzil Magnuson, NP 03/09/2018, 10:01 AM   Patient ID: Cynthia Odonnell, female   DOB: [birth year], 32 y.o.   MRN: [id number] .Marland KitchenMarland KitchenAgree with NP Progress Note

## 2018-03-09 NOTE — [facility] Suicide Risk Assessment (Signed)
[facility] INPATIENT:  Family/Significant Other Suicide Prevention Education  Suicide Prevention Education:  Contact Attempts: Carlyon ShadowDianne Hennigan (pt's mother) [phone] and pt's husband Arsenio KatzDarryl Lowe [phone]) have been identified by the patient as the family member/significant other with whom the patient will be residing, and identified as the person(s) who will aid the patient in the event of a mental health crisis.  With written consent from the patient, two attempts were made to provide suicide prevention education, prior to and/or following the patient's discharge.  We were unsuccessful in providing suicide prevention education.  A suicide education pamphlet was given to the patient to share with family/significant other.  Date and time of first attempt: 03/09/18 at 9:40AM (unable to leave voicemail) Date and time of second attempt: 03/09/18 at 2:03PM (busy tone x2/unable to leave voicemail on mother's phone).   Rona RavensHeather S Meera Vasco LCSW 03/09/2018, 2:02 PM

## 2018-03-09 NOTE — [facility] Group Notes (Signed)
[facility] [employer] Group Therapy 03/09/2018 1:15pm  Type of Therapy: Mental [employer] Presentation  Participation Level: Active  Participation Quality: Attentive  Affect: Appropriate  Cognitive: Oriented  Insight: Developing/Improving  Engagement in Therapy: Engaged  Modes of Intervention: Discussion, Education and Socialization  Summary of Progress/Problems: Mental [employer] Association (MHA) Speaker came to talk about his personal journey with mental health. The pt processed ways by which to relate to the speaker. MHA speaker provided handouts and educational information pertaining to groups and services offered by the MHA. Pt was engaged in speaker's presentation and was receptive to resources provided.    Cynthia Odonnell S Cynthia Guzek, LCSW 03/09/2018 3:03 PM  

## 2018-03-10 ENCOUNTER — Encounter (HOSPITAL_COMMUNITY): Payer: Self-pay | Admitting: Behavioral Health

## 2018-03-10 MED ORDER — GABAPENTIN 400 MG PO CAPS
400.0000 mg | ORAL_CAPSULE | Freq: Three times a day (TID) | ORAL | Status: DC
  Administered 2018-03-10 – 2018-03-12 (×7): 400 mg via ORAL
  Filled 2018-03-10 (×12): qty 1

## 2018-03-10 NOTE — Plan of Care (Signed)
Patient verbalizes understanding of information, education provided. 

## 2018-03-10 NOTE — [facility] Suicide Risk Assessment (Signed)
[facility] INPATIENT:  Family/Significant Other Suicide Prevention Education  Suicide Prevention Education:  Education Completed; Cynthia Odonnell, mother [phone]) has been identified by the patient as the family member/significant other with whom the patient will be residing, and identified as the person(s) who will aid the patient in the event of a mental health crisis (suicidal ideations/suicide attempt).  With written consent from the patient, the family member/significant other has been provided the following suicide prevention education, prior to the and/or following the discharge of the patient.  The suicide prevention education provided includes the following:  Suicide risk factors  Suicide prevention and interventions  National Suicide Hotline telephone number  [facility] assessment telephone number  [facility] 911  [facility] and/or Residential Mobile Crisis Unit telephone number  Request made of family/significant other to:  Remove weapons (e.g., guns, rifles, knives), all items previously/currently identified as safety concern.    Remove drugs/medications (over-the-counter, prescriptions, illicit drugs), all items previously/currently identified as a safety concern.  The family member/significant other verbalizes understanding of the suicide prevention education information provided.  The family member/significant other agrees to remove the items of safety concern listed above.  Per mom, the patient does not need to return home without going to a residential substance abuse program. She does not feel that her daughter can remain sober while waiting for residential treatment placement.     Cynthia Odonnell 03/10/2018, 10:05 AM

## 2018-03-10 NOTE — Progress Notes (Signed)
Pt attended NA group this evening.  

## 2018-03-10 NOTE — Plan of Care (Signed)
Problem: Activity: Goal: Interest or engagement in activities will improve Intervention: Patient has been provided a daily scheduled and is encouraged by staff to attend groups. Outcome: Patient is visible on the unit, interactive with peers/staff and attending groups. 03/10/2018 11:09 AM - Progressing by Ferrel Loganollazo, Coda Filler A, RN   Problem: Health Behavior/Discharge Planning: Goal: Compliance with treatment plan for underlying cause of condition will improve Intervention: Patient encouraged to take medications as prescribed and attend groups. Patient encouraged to be active in their recovery. Outcome: Patient is attending groups, taking medications as prescribed and complying with goals of treatment. 03/10/2018 11:09 AM - Progressing by Ferrel Loganollazo, Kamara Allan A, RN

## 2018-03-10 NOTE — Discharge Summary (Addendum)
Physician Discharge Summary Note  Patient:  Cynthia Odonnell is an 32 y.o., female MRN:  [id number] DOB:  [birth year] Patient phone:  [phone] (home)  Patient address:   [address] [place] [address],  Total Time spent with patient: 30 minutes  Date of Admission:  03/05/2018 Date of Discharge: 03/12/2018  Reason for Admission:  Suicidal ideation   Principal Problem: Substance induced mood disorder [facility]) Discharge Diagnoses: Patient Active Problem List   Diagnosis Date Noted  . Substance induced mood disorder (HCC) [F19.94] 03/06/2018  . Bipolar 1 disorder, mixed, severe (HCC) [F31.63] 03/05/2018  . Bipolar affective disorder (HCC) [F31.9]   . Bradycardia [R00.1]   . Cocaine abuse (HCC) [F14.10]   . Hypotension [I95.9]   . Suicide attempt (HCC) [T14.91XA]   . H/O cesarean section [Z98.891] 11/04/2014  . [redacted] weeks gestation of pregnancy [Z3A.37]   . Maternal morbid obesity in third trimester, antepartum (HCC) [O13.086, E66.01]   . Excessive weight gain in pregnancy in second trimester [O26.02]   . [redacted] weeks gestation of pregnancy [Z3A.33]   . Smoking [F17.200]   . Poor fetal growth affecting management of mother in third trimester, antepartum [O36.5930]   . [redacted] weeks gestation of pregnancy [Z3A.30]   . Maternal morbid obesity in second trimester, antepartum (HCC) [V78.469, E66.01]   . [redacted] weeks gestation of pregnancy [Z3A.24]   . Evaluate anatomy not seen on prior sonogram [Z04.89]   . Tobacco smoking affecting pregnancy in second trimester, antepartum [O99.332]   . Obesity affecting pregnancy in second trimester [O99.212]   . Uterine scar from previous cesarean delivery, antepartum complication [O34.219]   . Encounter for fetal anatomic survey [Z36.89]   . [redacted] weeks gestation of pregnancy [Z3A.20]     Past Psychiatric History: Patient reports a history of schizoaffective disorder, bipolar disorder, and depression.  Patient reported diagnosed at age 16 while using  substances and all diagnoses have been done while she has been using a type of substance.  Past Medical History:  Past Medical History:  Diagnosis Date  . Abnormal Pap smear of cervix   . Anxiety   . Arthritis   . Bronchitis    Hx - uses albuterol inhaler prn  . Cocaine abuse, in remission (HCC)   . Depression   . GERD (gastroesophageal reflux disease)    zantac prn  . Headache(784.0)    otc prn  . Optic neuritis    left eye  . Smoker   . Termination of pregnancy (fetus)    x 2    Past Surgical History:  Procedure Laterality Date  . CESAREAN SECTION  07/31/2008   [facility]  . CESAREAN SECTION N/A 11/04/2014   Procedure: CESAREAN SECTION;  Surgeon: Tracey Harries, MD;  Location: [facility];  Service: Obstetrics;  Laterality: N/A;  . COLPOSCOPY    . WISDOM TOOTH EXTRACTION     Family History:  Family History  Problem Relation Age of Onset  . Other Neg Hx    Family Psychiatric  History: Patient is adopted but adoptive mother reports that her biological sister is diagnosed with bipolar, biological brother was diagnosed with ADHD and bipolar, maternal grandmother was an alcoholic, biological mother was chronic substance abuse and was potentially using ketamine while pregnant with patient  Social History:  Social History   Substance and Sexual Activity  Alcohol Use No     Social History   Substance and Sexual Activity  Drug Use Yes  . Frequency: 3.0 times per week  .  Types: Cocaine   Comment: states "usually 2 grams at at time"    Social History   Socioeconomic History  . Marital status: Married    Spouse name: Not on file  . Number of children: Not on file  . Years of education: Not on file  . Highest education level: Not on file  Occupational History  . Not on file  Social Needs  . Financial resource strain: Not on file  . Food insecurity:    Worry: Not on file    Inability: Not on file  . Transportation needs:    Medical: Not on file    Non-medical: Not on file   Tobacco Use  . Smoking status: Current Every Day Smoker    Packs/day: 1.00    Years: 16.00    Pack years: 16.00    Types: Cigarettes  . Smokeless tobacco: Never Used  Substance and Sexual Activity  . Alcohol use: No  . Drug use: Yes    Frequency: 3.0 times per week    Types: Cocaine    Comment: states "usually 2 grams at at time"  . Sexual activity: Not on file  Lifestyle  . Physical activity:    Days per week: Not on file    Minutes per session: Not on file  . Stress: Not on file  Relationships  . Social connections:    Talks on phone: Not on file    Gets together: Not on file    Attends religious service: Not on file    Active member of club or organization: Not on file    Attends meetings of clubs or organizations: Not on file    Relationship status: Not on file  Other Topics Concern  . Not on file  Social History Narrative  . Not on file    Hospital Course: Patient is seen and examined.  Patient is a 32 year old female with a reported past psychiatric history significant for schizoaffective disorder, cocaine dependence, alcohol use disorder and marijuana use disorder who presented to the [facility] emergency department on 03/05/2018 with suicidal ideation.  The patient stated that she had been using cocaine excessively for the last 17 years.  She stated the longest period of sobriety that she had was approximately 9 months when she was pregnant.  She lives with her husband and 2 children.  She began taking type tizanidine 2 mg tablets 12 at a time.  She kept repeating this.  She became so sedated and lethargic that she originally went to the behavioral health hospital, but then was referred to the medical hospital because of bradycardia.  Apparently her heart rate did drop down to the 30s.  She stated that she wanted to get better, she wanted to get off drugs, and for now was frightened by the prospect that she might die because of her low heart rate.  She was  cleared medically and then sent to the behavioral hospital for evaluation and stabilization.  She has been treated with several medications in the past including lithium, but she stated that all the time that she had been treated she was continuing to use substances.  She was admitted to the hospital for evaluation and stabilization.  Cynthia Odonnell was started on medication regimen for presenting symptoms. She was medicated & discharged on; Gabapentin 300 mg po TID for alcohol dependence and anxiety,  Hydroxyzine to 50 mg po TID as needed for anxiety, thiamine 100 mg po daily for alcohol dependence,  and trazodone.50  mg po daily at bedtime as needed for insomnia. . Patient has been adherent with treatment recommendations. Patient tolerated the medications without any reported side effects are adverse reactions.  Patient was enrolled & participated in the group counseling sessions being offerred & held on this unit. Patient learned coping skills.  Cynthia Odonnell is seen today by the attending psychiatrist for discharge. Patient denies any delusions, no hallucinations or other psychotic process. Patient denies active or passive suicidal thoughts. No thoughts of violence. No craving for drugs. Endorses overall improvement in mood emotional state.    Nursing staff reports that patient has been appropriate on the unit. Patient has been interacting well with peers. No behavioral issues. Patient has not voiced any suicidal thoughts. Prior to discharge. Patient was discussed at the treatment team meeting this morning. Team members feels that patient is back to her baseline level of functioning. Team agrees with plan to discharge patient today. Patient was provided with all follow-up information to resume mental health treatment following discharge as noted below. Cynthia Odonnell was provided with a prescription for her [facility] discharge medications as well as a 7 day supply of samples. Patient left [facility] with all personal belongings in no apparent  distress. Transportation per patient/ family arrangement.    Labs: pregnancy negative. UDS positive for cocaine and THC. Ethanol negative. CBC normal besides RDW of 17.7. CMP showed glucose of 110, calcium 8.4, total protein 6.2, albumin 3.3 otherwise normal.  Physical Findings: AIMS: Facial and Oral Movements Muscles of Facial Expression: None, normal Lips and Perioral Area: None, normal Jaw: None, normal Tongue: None, normal,Extremity Movements Upper (arms, wrists, hands, fingers): None, normal Lower (legs, knees, ankles, toes): None, normal, Trunk Movements Neck, shoulders, hips: None, normal, Overall Severity Severity of abnormal movements (highest score from questions above): None, normal Incapacitation due to abnormal movements: None, normal Patient's awareness of abnormal movements (rate only patient's report): No Awareness, Dental Status Current problems with teeth and/or dentures?: No Does patient usually wear dentures?: No  CIWA:  CIWA-Ar Total: 10 COWS:     Musculoskeletal: Strength & Muscle Tone: within normal limits Gait & Station: normal Patient leans: N/A  Psychiatric Specialty Exam: SEE SRA BY MD  Physical Exam  Nursing note and vitals reviewed. Constitutional: She is oriented to person, place, and time.  Neurological: She is alert and oriented to person, place, and time.    Review of Systems  Psychiatric/Behavioral: Positive for substance abuse. Negative for hallucinations, memory loss and suicidal ideas. Depression: stable. Nervous/anxious: stable. Insomnia: stable.   All other systems reviewed and are negative.   Blood pressure 113/89, pulse 78, temperature 97.8 F (36.6 C), temperature source Oral, resp. rate 20, height 5\' 6"  (1.676 m), weight 103.9 kg (229 lb), SpO2 100 %, unknown if currently breastfeeding.Body mass index is 36.96 kg/m.    Have you used any form of tobacco in the last 30 days? (Cigarettes, Smokeless Tobacco, Cigars, and/or Pipes): Yes   Has this patient used any form of tobacco in the last 30 days? (Cigarettes, Smokeless Tobacco, Cigars, and/or Pipes)  Yes, A prescription for an FDA-approved tobacco cessation medication was offered at discharge and the patient refused  Blood Alcohol level:  Lab Results  Component Value Date   ETH <10 03/04/2018   [facility]  01/28/2009    <5        LOWEST DETECTABLE LIMIT FOR SERUM ALCOHOL IS 5 mg/dL FOR MEDICAL PURPOSES ONLY    Metabolic Disorder Labs:  No results found for: HGBA1C, MPG No  results found for: PROLACTIN No results found for: CHOL, TRIG, HDL, CHOLHDL, VLDL, LDLCALC  See Psychiatric Specialty Exam and Suicide Risk Assessment completed by Attending Physician prior to discharge.  Discharge destination:  Home  Is patient on multiple antipsychotic therapies at discharge:  No   Has Patient had three or more failed trials of antipsychotic monotherapy by history:  No  Recommended Plan for Multiple Antipsychotic Therapies: NA   Allergies as of 03/12/2018      Reactions   Amoxicillin Hives   Codeine Itching   Latex Itching   Penicillins Hives   Has patient had a PCN reaction causing immediate rash, facial/tongue/throat swelling, SOB or lightheadedness with hypotension: No Has patient had a PCN reaction causing severe rash involving mucus membranes or skin necrosis: No Has patient had a PCN reaction that required hospitalization: No Has patient had a PCN reaction occurring within the last 10 years: No If all of the above answers are "NO", then may proceed with Cephalosporin use.   Prednisone Itching   Augmentin [amoxicillin-pot Clavulanate] Hives, Rash   Lamictal [lamotrigine] Rash   Tramadol Itching, Rash      Medication List    TAKE these medications     Indication  gabapentin 400 MG capsule Commonly known as:  NEURONTIN Take 1 capsule (400 mg total) by mouth 3 (three) times daily.  Indication:  Alcohol Withdrawal Syndrome   hydrOXYzine 50 MG tablet Commonly  known as:  ATARAX/VISTARIL Take 1 tablet (50 mg total) by mouth every 6 (six) hours as needed for anxiety.  Indication:  Feeling Anxious, Tension   NEXPLANON 68 MG Impl implant Generic drug:  etonogestrel Inject 66 mg into the skin.  Indication:  Birth Control Treatment   nicotine polacrilex 2 MG gum Commonly known as:  NICORETTE Take 1 each (2 mg total) by mouth as needed for smoking cessation.  Indication:  Nicotine Addiction   traZODone 50 MG tablet Commonly known as:  DESYREL Take 1 tablet (50 mg total) by mouth at bedtime as needed for sleep.  Indication:  Trouble Sleeping      Follow-up Information    [facility] Follow up.   Specialty:  Addiction Medicine Why:  Referral faxed: 8/5 Contact information: [address] [place] [address] [phone]        [facility] Follow up.   Why:  Walk in within 3 days of hospital discharge [facility]. Hospital discharge walk in times: Monday, Wednesday, Fridays between 11am-12:30pm. Thank you.  Contact information: [address]  [place], [place] [address] Phone: [phone] Fax: [phone]        [facility] Follow up on 03/17/2018.   Specialty:  Behavioral Health Why:  Hospital follow-up/medication management on Wednesday 8/14 at 8:00AM. Please bring photo ID, medicaid card, and hospital discharge paperwork to this appt. Thank you.  Contact informationElpidio Eric: [address] [place] [address] [phone]           Follow-up recommendations:  Follow up with your outpatient provided for any medical issues. Activity & diet as recommended by your primary care provider.  Comments:  Patient is instructed prior to discharge to: Take all medications as prescribed by his/her mental healthcare provider. Report any adverse effects and or reactions from the medicines to his/her outpatient provider promptly. Patient has been instructed & cautioned: To not  engage in alcohol and or illegal drug use while on prescription medicines. In the event of worsening symptoms, patient is  instructed to call the crisis hotline, 911 and or go to the nearest ED for appropriate evaluation and treatment of symptoms. To follow-up with his/her primary care provider for your other medical issues, concerns and or health care needs.  Signed: Truman Hayward, FNP 03/12/2018, 8:45 AM   Patient seen, Suicide Assessment Completed.  Disposition Plan Reviewed

## 2018-03-10 NOTE — Progress Notes (Addendum)
[facility] MD Progress Note  03/10/2018 10:09 AM Cynthia Odonnell  MRN:  [id number]  Subjective: " I dont want to go home now. My plans was to live with my sister until I was accepted into [facility] but I am afraid that I will relapse."    Objective: Patient is seen and examined, case discussed with treatment team and chart reviewed. Patient is a 32 year old female with a past psychiatric history significant for either schizoaffective disorder versus bipolar disorder, cocaine dependence, alcohol use disorder and marijuana use disorder who was admitted on 03/06/2018.   On assessment patient is alert and oriented x4, very pleasant and cooperative. Patient is doing well on the unit and presenting without any thoughts of wanting to harm herself or other, negative for AVH and does not appear internally preoccupied. She has a history of substance abuse although is not in a withdrawal state. She endorses overall improvement in depression although does note some anxiety. She reports she is feeling anxious because she does not want to go home as she will be going to live at her sisters home and she is afraid that she will relapse. She is currently on the waiting list for [facility] and has requested that she remains in the hospital until a bed is available. She was advised that this was not an option. [facility] was discussed as an option by MD and she was told that this information would be passed on to the CSW. She was also encouraged to find other living arrangements. She is focused on her anxiety as well as Neurontin being increased. Will adjust this medication as noted below. She denies concern with appetite, sleeping pattern or current medications. He is less manic today and less hyper. At this time, she is contracting for safety on the unit.   To note; CSW did speak with patient mother and Per mom, the patient does not need to return home without going to a residential substance abuse program. She does not feel that  her daughter can remain sober while waiting for residential treatment placement.    Principal Problem: Substance induced mood disorder (HCC) Diagnosis:   Patient Active Problem List   Diagnosis Date Noted  . Substance induced mood disorder (HCC) [F19.94] 03/06/2018  . Bipolar 1 disorder, mixed, severe (HCC) [F31.63] 03/05/2018  . Bipolar affective disorder (HCC) [F31.9]   . Bradycardia [R00.1]   . Cocaine abuse (HCC) [F14.10]   . Hypotension [I95.9]   . Suicide attempt (HCC) [T14.91XA]   . H/O cesarean section [Z98.891] 11/04/2014  . [redacted] weeks gestation of pregnancy [Z3A.37]   . Maternal morbid obesity in third trimester, antepartum (HCC) [W09.811, E66.01]   . Excessive weight gain in pregnancy in second trimester [O26.02]   . [redacted] weeks gestation of pregnancy [Z3A.33]   . Smoking [F17.200]   . Poor fetal growth affecting management of mother in third trimester, antepartum [O36.5930]   . [redacted] weeks gestation of pregnancy [Z3A.30]   . Maternal morbid obesity in second trimester, antepartum (HCC) [B14.782, E66.01]   . [redacted] weeks gestation of pregnancy [Z3A.24]   . Evaluate anatomy not seen on prior sonogram [Z04.89]   . Tobacco smoking affecting pregnancy in second trimester, antepartum [O99.332]   . Obesity affecting pregnancy in second trimester [O99.212]   . Uterine scar from previous cesarean delivery, antepartum complication [O34.219]   . Encounter for fetal anatomic survey [Z36.89]   . [redacted] weeks gestation of pregnancy [Z3A.20]    Total Time spent with patient: 56  minutes  Past Psychiatric History: See admission H&P  Past Medical History:  Past Medical History:  Diagnosis Date  . Abnormal Pap smear of cervix   . Anxiety   . Arthritis   . Bronchitis    Hx - uses albuterol inhaler prn  . Cocaine abuse, in remission (HCC)   . Depression   . GERD (gastroesophageal reflux disease)    zantac prn  . Headache(784.0)    otc prn  . Optic neuritis    left eye  . Smoker   .  Termination of pregnancy (fetus)    x 2    Past Surgical History:  Procedure Laterality Date  . CESAREAN SECTION  07/31/2008   [facility]  . CESAREAN SECTION N/A 11/04/2014   Procedure: CESAREAN SECTION;  Surgeon: Tracey Harrieshomas Henley, MD;  Location: [facility];  Service: Obstetrics;  Laterality: N/A;  . COLPOSCOPY    . WISDOM TOOTH EXTRACTION     Family History:  Family History  Problem Relation Age of Onset  . Other Neg Hx    Family Psychiatric  History: See admission H&P Social History:  Social History   Substance and Sexual Activity  Alcohol Use No     Social History   Substance and Sexual Activity  Drug Use Yes  . Frequency: 3.0 times per week  . Types: Cocaine   Comment: states "usually 2 grams at at time"    Social History   Socioeconomic History  . Marital status: Married    Spouse name: Not on file  . Number of children: Not on file  . Years of education: Not on file  . Highest education level: Not on file  Occupational History  . Not on file  Social Needs  . Financial resource strain: Not on file  . Food insecurity:    Worry: Not on file    Inability: Not on file  . Transportation needs:    Medical: Not on file    Non-medical: Not on file  Tobacco Use  . Smoking status: Current Every Day Smoker    Packs/day: 1.00    Years: 16.00    Pack years: 16.00    Types: Cigarettes  . Smokeless tobacco: Never Used  Substance and Sexual Activity  . Alcohol use: No  . Drug use: Yes    Frequency: 3.0 times per week    Types: Cocaine    Comment: states "usually 2 grams at at time"  . Sexual activity: Not on file  Lifestyle  . Physical activity:    Days per week: Not on file    Minutes per session: Not on file  . Stress: Not on file  Relationships  . Social connections:    Talks on phone: Not on file    Gets together: Not on file    Attends religious service: Not on file    Active member of club or organization: Not on file    Attends meetings of clubs or organizations:  Not on file    Relationship status: Not on file  Other Topics Concern  . Not on file  Social History Narrative  . Not on file   Additional Social History:                         Sleep: Good  Appetite:  Fair  Current Medications: Current Facility-Administered Medications  Medication Dose Route Frequency Provider Last Rate Last Dose  . acetaminophen (TYLENOL) tablet 650 mg  650 mg Oral  Q6H PRN Laveda Abbe, NP   650 mg at 03/07/18 1816  . alum & mag hydroxide-simeth (MAALOX/MYLANTA) 200-200-20 MG/5ML suspension 30 mL  30 mL Oral Q4H PRN Laveda Abbe, NP      . gabapentin (NEURONTIN) capsule 300 mg  300 mg Oral TID Laveda Abbe, NP   300 mg at 03/10/18 0759  . hydrOXYzine (ATARAX/VISTARIL) tablet 50 mg  50 mg Oral Q6H PRN Denzil Magnuson, NP   50 mg at 03/09/18 2139  . magnesium hydroxide (MILK OF MAGNESIA) suspension 30 mL  30 mL Oral Daily PRN Laveda Abbe, NP   30 mL at 03/09/18 0748  . multivitamin with minerals tablet 1 tablet  1 tablet Oral Daily Money, Gerlene Burdock, FNP   1 tablet at 03/10/18 0759  . naproxen (NAPROSYN) tablet 500 mg  500 mg Oral BID PRN Laveda Abbe, NP   500 mg at 03/06/18 1311  . nicotine (NICODERM CQ - dosed in mg/24 hours) patch 21 mg  21 mg Transdermal Daily Laveda Abbe, NP   21 mg at 03/10/18 0800  . thiamine (VITAMIN B-1) tablet 100 mg  100 mg Oral Daily Money, Gerlene Burdock, FNP   100 mg at 03/10/18 0759  . traZODone (DESYREL) tablet 50 mg  50 mg Oral QHS PRN Laveda Abbe, NP   50 mg at 03/09/18 2139    Lab Results: No results found for this or any previous visit (from the past 48 hour(s)).  Blood Alcohol level:  Lab Results  Component Value Date   ETH <10 03/04/2018   [facility]  01/28/2009    <5        LOWEST DETECTABLE LIMIT FOR SERUM ALCOHOL IS 5 mg/dL FOR MEDICAL PURPOSES ONLY    Metabolic Disorder Labs: No results found for: HGBA1C, MPG No results found for: PROLACTIN No  results found for: CHOL, TRIG, HDL, CHOLHDL, VLDL, LDLCALC  Physical Findings: AIMS: Facial and Oral Movements Muscles of Facial Expression: None, normal Lips and Perioral Area: None, normal Jaw: None, normal Tongue: None, normal,Extremity Movements Upper (arms, wrists, hands, fingers): None, normal Lower (legs, knees, ankles, toes): None, normal, Trunk Movements Neck, shoulders, hips: None, normal, Overall Severity Severity of abnormal movements (highest score from questions above): None, normal Incapacitation due to abnormal movements: None, normal Patient's awareness of abnormal movements (rate only patient's report): No Awareness, Dental Status Current problems with teeth and/or dentures?: No Does patient usually wear dentures?: No  CIWA:  CIWA-Ar Total: 10 COWS:     Musculoskeletal: Strength & Muscle Tone: within normal limits Gait & Station: normal Patient leans: Right  Psychiatric Specialty Exam: Physical Exam  Nursing note and vitals reviewed. Constitutional: She is oriented to person, place, and time. She appears well-developed and well-nourished.  HENT:  Head: Normocephalic and atraumatic.  Respiratory: Effort normal.  Neurological: She is alert and oriented to person, place, and time.    Review of Systems  Psychiatric/Behavioral: Positive for substance abuse. Negative for depression, hallucinations, memory loss and suicidal ideas. The patient is nervous/anxious. The patient does not have insomnia.     Blood pressure 113/89, pulse 78, temperature 97.8 F (36.6 C), temperature source Oral, resp. rate 20, height 5\' 6"  (1.676 m), weight 103.9 kg (229 lb), SpO2 100 %, unknown if currently breastfeeding.Body mass index is 36.96 kg/m.  General Appearance: Disheveled  Eye Contact:  Fair  Speech:  Clear and Coherent and Normal Rate  Volume:  Normal  Mood:  Anxious  Affect:  Congruent  Thought Process:  Coherent  Orientation:  Full (Time, Place, and Person)  Thought  Content:  Logical. No AVH, ruminations or preoccupations   Suicidal Thoughts:  No  Homicidal Thoughts:  No  Memory:  Immediate;   Fair Recent;   Fair Remote;   Fair  Judgement:  Impaired  Insight:  Fair  Psychomotor Activity:  Decreased and Psychomotor Retardation  Concentration:  Concentration: Poor and Attention Span: Poor  Recall:  [employer] of Knowledge:  Fair  Language:  Fair  Akathisia:  Negative  Handed:  Right  AIMS (if indicated):     Assets:  Housing Physical Health Resilience  ADL's:  Intact  Cognition:  WNL  Sleep:  Number of Hours: 6.25     Treatment Plan Summary: Reviewed current treatment plan. Will continue the following plan with adjustments where noted;  Daily contact with patient to assess and evaluate symptoms and progress in treatment.  Medication management and Plan Patient is seen and examined.  Patient is a 32 year old female with the above-stated past psychiatric history seen in follow-up. She endorses overall improvement in depression although continues to endorse feeling anxious.She denies SI, HI or AVH and does not appear internally preoccupied. Per MD, will increase Gabapentin to 400 mg po TID for alcohol dependence and anxiety. Continued Hydroxyzine to 50 mg po TID as needed for anxiety, thiamine 100 mg po daily for alcohol dependence,  and trazodone.50 mg po daily at bedtime as needed for insomnia. Patient denies feeling over sedated.CSW will continue to work on discharge disposition.     Denzil Magnuson, NP 03/10/2018, 10:09 AM   Patient ID: Cynthia Odonnell, female   DOB: 17-Apr-1986, 32 y.o.   MRN: [id number] .Marland KitchenAgree with NP Progress Note

## 2018-03-10 NOTE — Progress Notes (Signed)
D: Patient observed up and visible in the milieu. [facility]. Patient states she had an "overall good day" however continues to experience anxiety. Patient's affect animated, mood anxious, restless. Speech pressured and can be rapid at times.  Denies pain however complained of constipation.   A: Medicated per orders, prn MOM given for complaints and trazadone for sleep given. Medication education provided. Level III obs in place for safety. Emotional support offered. Patient encouraged to complete Suicide Safety Plan before discharge. Encouraged to attend and participate in unit programming.   R: Patient verbalizes understanding of POC. On reassess, patient reports BM from MOM and reports some decrease in wakefulness. Patient denies SI/HI/AVH and remains safe on level III obs. Will continue to monitor throughout the night.

## 2018-03-10 NOTE — Progress Notes (Signed)
Patient ID: Cynthia Odonnell, female   DOB: 05/30/1986, 32 y.o.   MRN: [id number]  Nursing Progress Note [id number]  Data: Patient presents with anxious mood and rapid, tangential speech. Patient is pleasant during interactions but reports poor sleep. Patient complaint with scheduled medications. Patient denies pain/physical complaints. Patient completed self-inventory sheet and rates depression, hopelessness, and anxiety 3,0,7 respectively. Patient rates their sleep and appetite as poor/fair respectively. Patient states goal for today is to "get into [facility]". Patient reports she does not want to go home for fear of relapse. Patient is seen attending groups and visible in the milieu. Patient currently denies SI/HI/AVH.   Action: Patient educated about and provided medication per provider's orders. Patient safety maintained with q15 min safety checks and frequent rounding. Low fall risk precautions in place. Emotional support given. 1:1 interaction and active listening provided. Patient encouraged to attend meals and groups. Patient encouraged to work on treatment plan and goals. Labs, vital signs and patient behavior monitored throughout shift.   Response: Patient agrees to come to staff if any thoughts of SI/HI develop or if patient develops intention of acting on thoughts. Patient remains safe on the unit at this time. Patient is interacting with peers appropriately on the unit. Will continue to support and monitor.

## 2018-03-10 NOTE — Progress Notes (Addendum)
CSW followed up with the following regarding possible placement for residential substance abuse treatment:     [facility] - no answer. CSW left message requesting an update on bed availability.   [facility] - no admission screenings until early September.   [facility] -  per admissions, no female beds at this time.    CSW will continue to follow for potential placement.     Baldo DaubJolan Brave Dack, MSW, LCSWA Clinical Social Worker [facility]  Phone: [phone]

## 2018-03-10 NOTE — [facility] Group Notes (Signed)
LCSW Group Therapy Note 03/10/2018 3:18 PM  Type of Therapy and Topic: Group Therapy: Overcoming Obstacles  Participation Level: Active  Description of Group:  In this group patients will be encouraged to explore what they see as obstacles to their own wellness and recovery. They will be guided to discuss their thoughts, feelings, and behaviors related to these obstacles. The group will process together ways to cope with barriers, with attention given to specific choices patients can make. Each patient will be challenged to identify changes they are motivated to make in order to overcome their obstacles. This group will be process-oriented, with patients participating in exploration of their own experiences as well as giving and receiving support and challenge from other group members.  Therapeutic Goals: 1. Patient will identify personal and current obstacles as they relate to admission. 2. Patient will identify barriers that currently interfere with their wellness or overcoming obstacles.  3. Patient will identify feelings, thought process and behaviors related to these barriers. 4. Patient will identify two changes they are willing to make to overcome these obstacles:   Summary of Patient Progress  Cynthia Odonnell was engaged and participated throughout the group session. Cynthia Odonnell reports that her current obstacle is "I don't have a safe place to go to if I am discharged". Cynthia Odonnell [facility], however there are no beds available at this time. Cynthia Odonnell reports that she will continue to explore alternative options for treatment while she is in the hospital.    Therapeutic Modalities:  Cognitive Behavioral Therapy Solution Focused Therapy Motivational Interviewing Relapse Prevention Therapy   Alcario DroughtJolan Kaijah Abts LCSWA Clinical Social Worker

## 2018-03-11 ENCOUNTER — Encounter (HOSPITAL_COMMUNITY): Payer: Self-pay | Admitting: Behavioral Health

## 2018-03-11 DIAGNOSIS — F419 Anxiety disorder, unspecified: Secondary | ICD-10-CM | POA: Diagnosis not present

## 2018-03-11 DIAGNOSIS — F17210 Nicotine dependence, cigarettes, uncomplicated: Secondary | ICD-10-CM | POA: Diagnosis not present

## 2018-03-11 DIAGNOSIS — F1994 Other psychoactive substance use, unspecified with psychoactive substance-induced mood disorder: Secondary | ICD-10-CM | POA: Diagnosis not present

## 2018-03-11 MED ORDER — NICOTINE POLACRILEX 2 MG MT GUM
2.0000 mg | CHEWING_GUM | OROMUCOSAL | Status: DC | PRN
  Administered 2018-03-11 – 2018-03-12 (×4): 2 mg via ORAL
  Filled 2018-03-11 (×2): qty 1
  Filled 2018-03-11: qty 20

## 2018-03-11 NOTE — [facility] Group Notes (Signed)
LCSW Group Therapy Note 03/11/2018 3:11 PM  Type of Therapy and Topic: Group Therapy: Avoiding Self-Sabotaging and Enabling Behaviors  Participation Level: Active  Description of Group:  In this group, patients will learn how to identify obstacles, self-sabotaging and enabling behaviors, as well as: what are they, why do we do them and what needs these behaviors meet. Discuss unhealthy relationships and how to have positive healthy boundaries with those that sabotage and enable. Explore aspects of self-sabotage and enabling in yourself and how to limit these self-destructive behaviors in everyday life.  Therapeutic Goals: 1. Patient will identify one obstacle that relates to self-sabotage and enabling behaviors 2. Patient will identify one personal self-sabotaging or enabling behavior they did prior to admission 3. Patient will state a plan to change the above identified behavior 4. Patient will demonstrate ability to communicate their needs through discussion and/or role play.   Summary of Patient Progress:  Cynthia Odonnell was engaged and participated throughout the group session. Cynthia Odonnell states that "a negative environment and negative thinking" are her primary self sabotaging behaviors.     Therapeutic Modalities:  Cognitive Behavioral Therapy Person-Centered Therapy Motivational Interviewing   Baldo DaubJolan Lowry Bala LCSWA Clinical Social Worker

## 2018-03-11 NOTE — Progress Notes (Signed)
D: Patient observed up and active in the milieu. Observed and overheard frequently on phone calls to family where she becomes agitated, loud, and angry. Appropriate interactions however on unit with staff and peers. Patient states when her family visited tonight she shared with them her plan for discharge. Patient plans to discharge [facility]. Patient states, "2 women in recovery can support each other and go to meetings together. My family, they are triggers for me and the environment at my sisters isn't good." Patient's affect animated, mood anxious but pleasant.  Denies pain, physical complaints.   A: Medicated per orders, prn vistaril, trazadone and nicorette given per her request. Medication education provided. Level III obs in place for safety. Emotional support offered. Patient encouraged to complete Suicide Safety Plan before discharge. Encouraged to attend and participate in unit programming.  Spoke with patient 1:1 about the potential risk of discharging to stay with a peer whom she does not know. Encouraged her to give this more thought and to speak with tx team tomorrow.   R: Patient verbalizes understanding of POC, concerns expressed by this Clinical research associatewriter. On reassess, patient reports some decrease in complaints. Patient denies SI/HI/AVH and remains safe on level III obs. Will continue to monitor throughout the night.

## 2018-03-11 NOTE — Progress Notes (Signed)
Pt stated that today was been good, bad, and indirect. [facility]. 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

## 2018-03-11 NOTE — Progress Notes (Signed)
Patient ID: Cynthia Odonnell, female   DOB: 09/06/1985, 32 y.o.   MRN: [id number]  Nursing Progress Note [id number]  Data: Patient presents pleasant and cooperative but reports anxiety to writer at times. Patient complaint with scheduled medications and frequently requests her PRN Vistaril. Patient denies pain/physical complaints. Patient completed self-inventory sheet and rates depression, hopelessness, and anxiety 3,0,0 respectively. Patient rates their sleep and appetite as poor/good respectively. Patient states goal for today is to "find placement and talk to the social worker". Patient is seen attending groups and visible in the milieu. Patient is seen interacting with peers in the dayroom. Patient currently denies SI/HI/AVH.   Action: Patient educated about and provided medication per provider's orders. Patient safety maintained with q15 min safety checks and frequent rounding. Low fall risk precautions in place. Emotional support given. 1:1 interaction and active listening provided. Patient encouraged to attend meals and groups. Patient encouraged to work on treatment plan and goals. Labs, vital signs and patient behavior monitored throughout shift.   Response: Patient agrees to come to staff if any thoughts of SI/HI develop or if patient develops intention of acting on thoughts. Patient remains safe on the unit at this time. Patient is interacting with peers appropriately on the unit. Will continue to support and monitor.

## 2018-03-11 NOTE — Progress Notes (Addendum)
[facility] MD Progress Note  03/11/2018 12:30 PM Cynthia Odonnell  MRN:  [id number]  Subjective: " I dont know what I am going to do when I get out of here. There are to many drugs in my sisters home. I called [employer] but I am not going there because they told me I would have to sleep on a mat for the weekend and I am not going do that.."    Objective: Patient is seen and examined, case discussed with treatment team and chart reviewed. Patient is a 32 year old female with a past psychiatric history significant for either schizoaffective disorder versus bipolar disorder, cocaine dependence, alcohol use disorder and marijuana use disorder who was admitted on 03/06/2018.   On assessment patient is alert and oriented x4, very pleasant and cooperative. Cynthia Odonnell remains complaint with unit milieu presenting without significant behavioral concerns. She becomes very anxious when discussing discharge plans initially reporting she has no where to go following discharge and she continues to endorse she would like to remains hospitilized until a bed becomes available at [facility]. We have discussed this with patient that this is not an option. She was provided with information for [employer] she is declining to go. We discussed other possible options and although she first stated she did not want to go to her sisters home because of drug activity int he home, she later stated, " I guess I will be alright at my sisters home for the weekend as long as she is there."  She reports she is going to call her sister today to see if she can stay with her following discharge and unit there is a bed available at [facility].  She does know that her expected discharge date is 03/11/2018. She is encouraged to find other living arrangements.Patient has consistently denied any SI and at the time of this evaluation, she does appear stable for discharge. She denies any HI or AVH and does not appear internally preoccupied. She  denies concerns with appetite and reports decreased sleeping pattern. She is very focused on medications and asks for her Neurontin  to be increased although it was increased yesterday. She reports poor sleeping pattern and at first reports she would prefer to be on another medication besides Trazodone because she is afraid that it may cause her to gain weight. She then states that would like for benadryl to be added to help her sleep along with the Trazodone. She was advised that she has Vistaril which can be given up to three times per day and to take one prior to bedtime as oppose to adding benadryl Per nursing, patient is frequently requesting her PRN Vistaril. She remains medication complaint. Denies physical complaints. At this time, she is contracting for safety on the unit.      Principal Problem: Substance induced mood disorder (HCC) Diagnosis:   Patient Active Problem List   Diagnosis Date Noted  . Substance induced mood disorder (HCC) [F19.94] 03/06/2018  . Bipolar 1 disorder, mixed, severe (HCC) [F31.63] 03/05/2018  . Bipolar affective disorder (HCC) [F31.9]   . Bradycardia [R00.1]   . Cocaine abuse (HCC) [F14.10]   . Hypotension [I95.9]   . Suicide attempt (HCC) [T14.91XA]   . H/O cesarean section [Z98.891] 11/04/2014  . [redacted] weeks gestation of pregnancy [Z3A.37]   . Maternal morbid obesity in third trimester, antepartum (HCC) [W09.811, E66.01]   . Excessive weight gain in pregnancy in second trimester [O26.02]   . [redacted] weeks gestation of  pregnancy [Z3A.33]   . Smoking [F17.200]   . Poor fetal growth affecting management of mother in third trimester, antepartum [O36.5930]   . [redacted] weeks gestation of pregnancy [Z3A.30]   . Maternal morbid obesity in second trimester, antepartum (HCC) [Z61.096, E66.01]   . [redacted] weeks gestation of pregnancy [Z3A.24]   . Evaluate anatomy not seen on prior sonogram [Z04.89]   . Tobacco smoking affecting pregnancy in second trimester, antepartum [O99.332]    . Obesity affecting pregnancy in second trimester [O99.212]   . Uterine scar from previous cesarean delivery, antepartum complication [O34.219]   . Encounter for fetal anatomic survey [Z36.89]   . [redacted] weeks gestation of pregnancy [Z3A.20]    Total Time spent with patient: 20 minutes  Past Psychiatric History: See admission H&P  Past Medical History:  Past Medical History:  Diagnosis Date  . Abnormal Pap smear of cervix   . Anxiety   . Arthritis   . Bronchitis    Hx - uses albuterol inhaler prn  . Cocaine abuse, in remission (HCC)   . Depression   . GERD (gastroesophageal reflux disease)    zantac prn  . Headache(784.0)    otc prn  . Optic neuritis    left eye  . Smoker   . Termination of pregnancy (fetus)    x 2    Past Surgical History:  Procedure Laterality Date  . CESAREAN SECTION  07/31/2008   [facility]  . CESAREAN SECTION N/A 11/04/2014   Procedure: CESAREAN SECTION;  Surgeon: Tracey Harries, MD;  Location: [facility];  Service: Obstetrics;  Laterality: N/A;  . COLPOSCOPY    . WISDOM TOOTH EXTRACTION     Family History:  Family History  Problem Relation Age of Onset  . Other Neg Hx    Family Psychiatric  History: See admission H&P Social History:  Social History   Substance and Sexual Activity  Alcohol Use No     Social History   Substance and Sexual Activity  Drug Use Yes  . Frequency: 3.0 times per week  . Types: Cocaine   Comment: states "usually 2 grams at at time"    Social History   Socioeconomic History  . Marital status: Married    Spouse name: Not on file  . Number of children: Not on file  . Years of education: Not on file  . Highest education level: Not on file  Occupational History  . Not on file  Social Needs  . Financial resource strain: Not on file  . Food insecurity:    Worry: Not on file    Inability: Not on file  . Transportation needs:    Medical: Not on file    Non-medical: Not on file  Tobacco Use  . Smoking status: Current  Every Day Smoker    Packs/day: 1.00    Years: 16.00    Pack years: 16.00    Types: Cigarettes  . Smokeless tobacco: Never Used  Substance and Sexual Activity  . Alcohol use: No  . Drug use: Yes    Frequency: 3.0 times per week    Types: Cocaine    Comment: states "usually 2 grams at at time"  . Sexual activity: Not on file  Lifestyle  . Physical activity:    Days per week: Not on file    Minutes per session: Not on file  . Stress: Not on file  Relationships  . Social connections:    Talks on phone: Not on file    Gets  together: Not on file    Attends religious service: Not on file    Active member of club or organization: Not on file    Attends meetings of clubs or organizations: Not on file    Relationship status: Not on file  Other Topics Concern  . Not on file  Social History Narrative  . Not on file   Additional Social History:                         Sleep: decreased  Appetite:  Fair  Current Medications: Current Facility-Administered Medications  Medication Dose Route Frequency Provider Last Rate Last Dose  . acetaminophen (TYLENOL) tablet 650 mg  650 mg Oral Q6H PRN Laveda Abbe, NP   650 mg at 03/07/18 1816  . alum & mag hydroxide-simeth (MAALOX/MYLANTA) 200-200-20 MG/5ML suspension 30 mL  30 mL Oral Q4H PRN Laveda Abbe, NP      . gabapentin (NEURONTIN) capsule 400 mg  400 mg Oral TID Denzil Magnuson, NP   400 mg at 03/11/18 1203  . hydrOXYzine (ATARAX/VISTARIL) tablet 50 mg  50 mg Oral Q6H PRN Denzil Magnuson, NP   50 mg at 03/11/18 0919  . magnesium hydroxide (MILK OF MAGNESIA) suspension 30 mL  30 mL Oral Daily PRN Laveda Abbe, NP   30 mL at 03/10/18 2112  . multivitamin with minerals tablet 1 tablet  1 tablet Oral Daily Money, Gerlene Burdock, FNP   1 tablet at 03/11/18 5284  . naproxen (NAPROSYN) tablet 500 mg  500 mg Oral BID PRN Laveda Abbe, NP   500 mg at 03/06/18 1311  . nicotine polacrilex (NICORETTE) gum 2  mg  2 mg Oral PRN Tamzin Bertling, Rockey Situ, MD      . thiamine (VITAMIN B-1) tablet 100 mg  100 mg Oral Daily Money, Travis B, FNP   100 mg at 03/11/18 0756  . traZODone (DESYREL) tablet 50 mg  50 mg Oral QHS PRN Laveda Abbe, NP   50 mg at 03/10/18 2156    Lab Results: No results found for this or any previous visit (from the past 48 hour(s)).  Blood Alcohol level:  Lab Results  Component Value Date   ETH <10 03/04/2018   [facility]  01/28/2009    <5        LOWEST DETECTABLE LIMIT FOR SERUM ALCOHOL IS 5 mg/dL FOR MEDICAL PURPOSES ONLY    Metabolic Disorder Labs: No results found for: HGBA1C, MPG No results found for: PROLACTIN No results found for: CHOL, TRIG, HDL, CHOLHDL, VLDL, LDLCALC  Physical Findings: AIMS: Facial and Oral Movements Muscles of Facial Expression: None, normal Lips and Perioral Area: None, normal Jaw: None, normal Tongue: None, normal,Extremity Movements Upper (arms, wrists, hands, fingers): None, normal Lower (legs, knees, ankles, toes): None, normal, Trunk Movements Neck, shoulders, hips: None, normal, Overall Severity Severity of abnormal movements (highest score from questions above): None, normal Incapacitation due to abnormal movements: None, normal Patient's awareness of abnormal movements (rate only patient's report): No Awareness, Dental Status Current problems with teeth and/or dentures?: No Does patient usually wear dentures?: No  CIWA:  CIWA-Ar Total: 10 COWS:     Musculoskeletal: Strength & Muscle Tone: within normal limits Gait & Station: normal Patient leans: Right  Psychiatric Specialty Exam: Physical Exam  Nursing note and vitals reviewed. Constitutional: She is oriented to person, place, and time. She appears well-developed and well-nourished.  HENT:  Head: Normocephalic and atraumatic.  Respiratory:  Effort normal.  Neurological: She is alert and oriented to person, place, and time.    Review of Systems  Psychiatric/Behavioral:  Positive for substance abuse. Negative for depression, hallucinations, memory loss and suicidal ideas. The patient is nervous/anxious. The patient does not have insomnia.     Blood pressure 114/80, pulse 65, temperature 97.9 F (36.6 C), resp. rate 20, height 5\' 6"  (1.676 m), weight 103.9 kg, SpO2 100 %, unknown if currently breastfeeding.Body mass index is 36.96 kg/m.  General Appearance: Disheveled  Eye Contact:  Fair  Speech:  Clear and Coherent and Normal Rate  Volume:  Normal  Mood:  Anxious  Affect:  Congruent  Thought Process:  Coherent  Orientation:  Full (Time, Place, and Person)  Thought Content:  Logical. No AVH, ruminations or preoccupations   Suicidal Thoughts:  No  Homicidal Thoughts:  No  Memory:  Immediate;   Fair Recent;   Fair Remote;   Fair  Judgement:  Impaired  Insight:  Fair  Psychomotor Activity:  Decreased and Psychomotor Retardation  Concentration:  Concentration: Poor and Attention Span: Poor  Recall:  [employer]  Fund of Knowledge:  Fair  Language:  Fair  Akathisia:  Negative  Handed:  Right  AIMS (if indicated):     Assets:  Housing Physical Health Resilience  ADL's:  Intact  Cognition:  WNL  Sleep:  Number of Hours: 5.25     Treatment Plan Summary: Reviewed current treatment plan. Will continue the following plan without adjustments at this time;  Daily contact with patient to assess and evaluate symptoms and progress in treatment.  Medication management and Plan Patient is seen and examined.  Patient is a 32 year old female with the above-stated past psychiatric history seen in follow-up. Patient continues to reports improvement in depression. She denies SI, HI or AVH. She endorses that she remains anxious and has trouble sleeping and is very focused on her medications for management of these symptoms. [facility] as noted above. She states now, she will ask her sister if she can come live with her for a  short period of time until [facility]. As of today, no bed was available. There will be no changes in her medications. Continued  Gabapentin to 400 mg po TID for alcohol dependence and anxiety, Hydroxyzine to 50 mg po TID as needed for anxiety, thiamine 100 mg po daily for alcohol dependence,  and trazodone.50 mg po daily at bedtime as needed for insomnia (she was encouraged to use Vistaril for insomnia if needed).Patient is scheduled to be discharged 03/11/2018.    Denzil MagnusonLaShunda Thomas, NP 03/11/2018, 12:30 PM   Patient ID: Kara Diesara E Hinchliffe, female   DOB: 08/20/1985, 32 y.o.   MRN: [id number] .Marland Kitchen.Agree with NP Progress Note

## 2018-03-11 NOTE — Plan of Care (Signed)
Patient remains anxious however reports less depression and increased ability to manage anxiety.  Patient reports quality sleep.

## 2018-03-11 NOTE — Plan of Care (Signed)
Problem: Health Behavior/Discharge Planning: Goal: Compliance with treatment plan for underlying cause of condition will improve Intervention: Patient encouraged to take medications as prescribed and attend groups. Patient encouraged to be active in their recovery. Outcome: Patient is attending groups, taking medications as prescribed and complying with goals of treatment. 03/11/2018 9:34 AM - Progressing by Ferrel Loganollazo, Jovanie Verge A, RN   Problem: Safety: Goal: Periods of time without injury will increase Intervention: Patient contracts for safety on the unit. Low fall risk precautions in place. Safety monitored with q15 minute checks. Outcome: Patient remains safe on the unit at this time. 03/11/2018 9:34 AM - Progressing by Ferrel Loganollazo, Shaquita Fort A, RN

## 2018-03-12 DIAGNOSIS — F1994 Other psychoactive substance use, unspecified with psychoactive substance-induced mood disorder: Secondary | ICD-10-CM | POA: Diagnosis not present

## 2018-03-12 MED ORDER — GABAPENTIN 300 MG PO CAPS: 600.0000 mg | ORAL_CAPSULE | Freq: Three times a day (TID) | ORAL | 0 refills | Status: DC

## 2018-03-12 MED ORDER — HYDROXYZINE HCL 50 MG PO TABS: 50.0000 mg | ORAL_TABLET | Freq: Four times a day (QID) | ORAL | 0 refills | Status: DC | PRN

## 2018-03-12 MED ORDER — GABAPENTIN 400 MG PO CAPS: 400.0000 mg | ORAL_CAPSULE | Freq: Three times a day (TID) | ORAL | 0 refills | Status: DC

## 2018-03-12 MED ORDER — GABAPENTIN 300 MG PO CAPS
600.0000 mg | ORAL_CAPSULE | Freq: Three times a day (TID) | ORAL | Status: DC
  Filled 2018-03-12 (×2): qty 2

## 2018-03-12 MED ORDER — NICOTINE POLACRILEX 2 MG MT GUM: 2.0000 mg | CHEWING_GUM | OROMUCOSAL | 0 refills | Status: DC | PRN

## 2018-03-12 MED ORDER — TRAZODONE HCL 50 MG PO TABS: 50.0000 mg | ORAL_TABLET | Freq: Every evening | ORAL | 0 refills | Status: DC | PRN

## 2018-03-12 NOTE — Tx Team (Signed)
Interdisciplinary Treatment and Diagnostic Plan Update  03/12/2018 Time of Session: 0830AM  Cynthia Odonnell MRN: [id number]  Principal Diagnosis: Substance induced mood disorder (HCC)  Secondary Diagnoses: Principal Problem:   Substance induced mood disorder (HCC) Active Problems:   Bipolar 1 disorder, mixed, severe (HCC)   Current Medications:  Current Facility-Administered Medications  Medication Dose Route Frequency Provider Last Rate Last Dose  . acetaminophen (TYLENOL) tablet 650 mg  650 mg Oral Q6H PRN Laveda Abbe, NP   650 mg at 03/07/18 1816  . alum & mag hydroxide-simeth (MAALOX/MYLANTA) 200-200-20 MG/5ML suspension 30 mL  30 mL Oral Q4H PRN Laveda Abbe, NP      . gabapentin (NEURONTIN) capsule 400 mg  400 mg Oral TID Denzil Magnuson, NP   400 mg at 03/12/18 0752  . hydrOXYzine (ATARAX/VISTARIL) tablet 50 mg  50 mg Oral Q6H PRN Denzil Magnuson, NP   50 mg at 03/11/18 2113  . magnesium hydroxide (MILK OF MAGNESIA) suspension 30 mL  30 mL Oral Daily PRN Laveda Abbe, NP   30 mL at 03/10/18 2112  . multivitamin with minerals tablet 1 tablet  1 tablet Oral Daily Money, Gerlene Burdock, FNP   1 tablet at 03/12/18 4098  . naproxen (NAPROSYN) tablet 500 mg  500 mg Oral BID PRN Laveda Abbe, NP   500 mg at 03/06/18 1311  . nicotine polacrilex (NICORETTE) gum 2 mg  2 mg Oral PRN Cobos, Rockey Situ, MD   2 mg at 03/12/18 0753  . thiamine (VITAMIN B-1) tablet 100 mg  100 mg Oral Daily Money, Gerlene Burdock, FNP   100 mg at 03/12/18 1191  . traZODone (DESYREL) tablet 50 mg  50 mg Oral QHS PRN Laveda Abbe, NP   50 mg at 03/11/18 2114   PTA Medications: Medications Prior to Admission  Medication Sig Dispense Refill Last Dose  . etonogestrel (NEXPLANON) 68 MG IMPL implant Inject 66 mg into the skin.       Patient Stressors: Marital or family conflict Substance abuse  Patient Strengths: Ability for insight Average or above average  intelligence Communication skills Motivation for treatment/growth Physical Health Supportive family/friends  Treatment Modalities: Medication Management, Group therapy, Case management,  1 to 1 session with clinician, Psychoeducation, Recreational therapy.   Physician Treatment Plan for Primary Diagnosis: Substance induced mood disorder (HCC) Long Term Goal(s): Improvement in symptoms so as ready for discharge Improvement in symptoms so as ready for discharge   Short Term Goals: Ability to identify changes in lifestyle to reduce recurrence of condition will improve Ability to verbalize feelings will improve Ability to disclose and discuss suicidal ideas Ability to demonstrate self-control will improve Ability to identify and develop effective coping behaviors will improve Ability to maintain clinical measurements within normal limits will improve Compliance with prescribed medications will improve Ability to identify triggers associated with substance abuse/mental health issues will improve  Medication Management: Evaluate patient's response, side effects, and tolerance of medication regimen.  Therapeutic Interventions: 1 to 1 sessions, Unit Group sessions and Medication administration.  Evaluation of Outcomes: Adequate for Discharge  Physician Treatment Plan for Secondary Diagnosis: Principal Problem:   Substance induced mood disorder (HCC) Active Problems:   Bipolar 1 disorder, mixed, severe (HCC)  Long Term Goal(s): Improvement in symptoms so as ready for discharge Improvement in symptoms so as ready for discharge   Short Term Goals: Ability to identify changes in lifestyle to reduce recurrence of condition will improve Ability to verbalize feelings will  improve Ability to disclose and discuss suicidal ideas Ability to demonstrate self-control will improve Ability to identify and develop effective coping behaviors will improve Ability to maintain clinical measurements  within normal limits will improve Compliance with prescribed medications will improve Ability to identify triggers associated with substance abuse/mental health issues will improve     Medication Management: Evaluate patient's response, side effects, and tolerance of medication regimen.  Therapeutic Interventions: 1 to 1 sessions, Unit Group sessions and Medication administration.  Evaluation of Outcomes: Adequate for Discharge   RN Treatment Plan for Primary Diagnosis: Substance induced mood disorder (HCC) Long Term Goal(s): Knowledge of disease and therapeutic regimen to maintain health will improve  Short Term Goals: Ability to remain free from injury will improve, Ability to verbalize feelings will improve, Ability to disclose and discuss suicidal ideas and Ability to identify and develop effective coping behaviors will improve  Medication Management: RN will administer medications as ordered by provider, will assess and evaluate patient's response and provide education to patient for prescribed medication. RN will report any adverse and/or side effects to prescribing provider.  Therapeutic Interventions: 1 on 1 counseling sessions, Psychoeducation, Medication administration, Evaluate responses to treatment, Monitor vital signs and CBGs as ordered, Perform/monitor CIWA, COWS, AIMS and Fall Risk screenings as ordered, Perform wound care treatments as ordered.  Evaluation of Outcomes: Adequate for Discharge   LCSW Treatment Plan for Primary Diagnosis: Substance induced mood disorder (HCC) Long Term Goal(s): Safe transition to appropriate next level of care at discharge, Engage patient in therapeutic group addressing interpersonal concerns.  Short Term Goals: Engage patient in aftercare planning with referrals and resources, Facilitate patient progression through stages of change regarding substance use diagnoses and concerns and Identify triggers associated with mental health/substance  abuse issues  Therapeutic Interventions: Assess for all discharge needs, 1 to 1 time with Social worker, Explore available resources and support systems, Assess for adequacy in community support network, Educate family and significant other(s) on suicide prevention, Complete Psychosocial Assessment, Interpersonal group therapy.  Evaluation of Outcomes: Adequate for Discharge   Progress in Treatment: Attending groups: Yes. Participating in groups: Yes. Taking medication as prescribed: Yes. Toleration medication: Yes. Family/Significant other contact made: Yes, individual(s) contacted:  the patient's mother Patient understands diagnosis: Yes. Discussing patient identified problems/goals with staff: Yes. Medical problems stabilized or resolved: Yes. Denies suicidal/homicidal ideation: Yes. Issues/concerns per patient self-inventory: No. Other: n/a   New problem(s) identified: No, Describe:  n/a  New Short Term/Long Term Goal(s): detox, medication management for mood stabilization; elimination of SI thoughts; development of comprehensive mental wellness/sobriety plan.   Patient Goals:  "I need to learn better coping skills."   Discharge Plan or Barriers: Patient reports she plans to discharge home with a friend, until she can get accepted to [facility] for substance abuse treatment. Patient has follow up appointments for [facility] and [facility] for additional support until she can be accepted for residential treatment.   Reason for Continuation of Hospitalization: None  Estimated Length of Stay: Discharging, 03/12/18  Attendees: Patient: 03/12/2018 8:35 AM  Physician:Dr. Jola Babinski MD; Dr. Altamese Nibley MD; Dr. Nehemiah Massed, MD  03/12/2018 8:35 AM  Nursing: Meriam Sprague RN; Moshe Salisbury; [place], RN 03/12/2018 8:35 AM  RN Care Manager:x 03/12/2018 8:35 AM  Social Worker: Corrie Mckusick LCSW; Baldo Daub, Theresia Majors 03/12/2018 8:35 AM  Recreational Therapist:  03/12/2018 8:35 AM  Other: Renold Don NP; Hillery Jacks NP  03/12/2018 8:35 AM  Other: X 03/12/2018 8:35 AM  Other:X 03/12/2018 8:35 AM  Scribe for Treatment Team: Mariah MillingJolan E Anslie Spadafora, LCSWA 03/12/2018 8:35 AM

## 2018-03-12 NOTE — Progress Notes (Signed)
Recreation Therapy Notes  Date: 8.9.19 Time: 0930 Location: [address] Dayroom  Group Topic: Stress Management  Goal Area(s) Addresses:  Patient will verbalize importance of using healthy stress management.  Patient will identify positive emotions associated with healthy stress management.   Behavioral Response: Engaged  Intervention: Stress Management  Activity :  Guided Imagery.  LRT introduced the stress management technique of guided imagery.  LRT read a script to guide patients on mental vacation through a wildlife sanctuary.  Patients were to listen and follow along as LRT read script.  Education:  Stress Management, Discharge Planning.   Education Outcome: Acknowledges edcuation/In group clarification offered/Needs additional education  Clinical Observations/Feedback: Pt attended and participated in group.     Cristoval Teall, LRT/CTRS         Katya Rolston A 03/12/2018 11:03 AM 

## 2018-03-12 NOTE — Progress Notes (Signed)
Pt. Discharged per MD orders;  PT. Currently denies any HI/SI or AVH.  Pt. Was given education regarding follow up appointments and medications by RN.  Pt. Denies any questions or concerns about the medications.  Pt. Was escorted to the search room to retrieve her belongings by RN before being discharged to the hospital lobby.  

## 2018-03-12 NOTE — Progress Notes (Signed)
  [facility] :  Will you be returning to the same living situation after discharge:  Yes,  patient is returning home At discharge, do you have transportation home?: Yes,  patient reports her mother is picking her up at discharge Do you have the ability to pay for your medications: No.  Release of information consent forms completed and in the chart;  Patient's signature needed at discharge.  Patient to Follow up at: Follow-up Information    [facility] Follow up.   Specialty:  Addiction Medicine Why:  Referral faxed: 8/5 Contact information: [address] [place] [address] [phone]        [facility] Follow [facility].   Why:  Walk in within 3 days of hospital discharge to be [facility]. Hospital discharge walk in times: Monday, Wednesday, Fridays between 11am-12:30pm. Thank you.  Contact information: [address]  [place], [place] [address] Phone: [phone] Fax: [phone]        [facility] Follow [facility] on 03/17/2018.   Specialty:  Behavioral Health Why:  Hospital follow-up/medication management on Wednesday 8/14 at 8:00AM. Please bring photo ID, medicaid card, and hospital discharge paperwork to this appt. Thank you.  Contact information: [address] [place] [address] [phone]           Next level of care provider has access to [facility]:yes  Safety Planning and Suicide Prevention discussed: Yes,  with the patient's mother  Have you used any form of tobacco in the last 30 days? (Cigarettes, Smokeless Tobacco, Cigars, and/or Pipes): Yes  Has patient been referred to the Quitline?: Patient refused referral  Patient has been referred for addiction treatment: Yes  Maeola SarahJolan E Tristian Bouska, LCSWA 03/12/2018, 1:28 PM

## 2018-03-12 NOTE — [facility] Suicide Risk Assessment (Signed)
[facility] Discharge Suicide Risk Assessment   Principal Problem: Substance induced mood disorder [facility]) Discharge Diagnoses:  Patient Active Problem List   Diagnosis Date Noted  . Substance induced mood disorder ([place]) [F19.94] 03/06/2018  . Bipolar 1 disorder, mixed, severe ([place]) [F31.63] 03/05/2018  . Bipolar affective disorder ([place]) [F31.9]   . Bradycardia [R00.1]   . Cocaine abuse ([place]) [F14.10]   . Hypotension [I95.9]   . Suicide attempt ([place]) [T14.91XA]   . H/O cesarean section [Z98.891] 11/04/2014  . [redacted] weeks gestation of pregnancy [Z3A.37]   . Maternal morbid obesity in third trimester, antepartum ([place]) [T26.712, E66.01]   . Excessive weight gain in pregnancy in second trimester [O26.02]   . [redacted] weeks gestation of pregnancy [Z3A.33]   . Smoking [F17.200]   . Poor fetal growth affecting management of mother in third trimester, antepartum [O36.5930]   . [redacted] weeks gestation of pregnancy [Z3A.30]   . Maternal morbid obesity in second trimester, antepartum ([place]) [W58.099, E66.01]   . [redacted] weeks gestation of pregnancy [Z3A.24]   . Evaluate anatomy not seen on prior sonogram [Z04.89]   . Tobacco smoking affecting pregnancy in second trimester, antepartum [O99.332]   . Obesity affecting pregnancy in second trimester [O99.212]   . Uterine scar from previous cesarean delivery, antepartum complication [I33.825]   . Encounter for fetal anatomic survey [Z36.89]   . [redacted] weeks gestation of pregnancy [Z3A.20]     Total Time spent with patient: 30 minutes  Musculoskeletal: Strength & Muscle Tone: within normal limits Gait & Station: normal Patient leans: N/A  Psychiatric Specialty Exam: ROS no headache, no chest pain, no shortness of breath, no nausea, no vomiting , no fever   Blood pressure 114/80, pulse 65, temperature 97.9 F (36.6 C), resp. rate 20, height 5' 6"  (1.676 m), weight 103.9 kg, SpO2 100 %, unknown if currently breastfeeding.Body mass index is 36.96 kg/m.  General Appearance:  Well Groomed  Eye Contact::  Good  Speech:  Normal Rate409  Volume:  Normal  Mood:  endorses improving mood, reports she feels anxious   Affect:  appropriate, anxious  Thought Process:  Linear and Descriptions of Associations: Intact  Orientation:  Full (Time, Place, and Person)  Thought Content:  no hallucinations, no delusions, not internally preoccupied   Suicidal Thoughts:  No denIes suicidal or self injurious ideations, denies homicidal or violent ideations  Homicidal Thoughts:  No  Memory:  recent and remote grossly intact   Judgement:  Other:  improving   Insight:  fair- improving   Psychomotor Activity:  Normal  Concentration:  Good  Recall:  Good  Fund of Knowledge:Good  Language: Good  Akathisia:  Negative  Handed:  Right  AIMS (if indicated):     Assets:  Desire for Improvement Resilience  Sleep:  Number of Hours: 3.25  Cognition: WNL  ADL's:  Intact   Mental Status Per Nursing Assessment::   On Admission:  Suicidal ideation indicated by others, Self-harm behaviors  Demographic Factors:  32 year old female, lives with mom , husband, children, has two children. Unemployed .  Loss Factors: Substance abuse , grandfather has been medically ill, patient reports she had recently quit her job in order to take care of grandfather  Historical Factors: History of schizoaffective disorder, cocaine use disorder   Risk Reduction Factors:   Responsible for children under 46 years of age, Sense of responsibility to family, Living with another person, especially a relative and Positive coping skills or problem solving skills  Continued Clinical  Symptoms:  At this time patient is alert, attentive, well groomed, mood is reported as improved but endorses anxiety as she approaches discharge. No suicidal or homicidal ideations, no hallucinations , no delusions expressed at this time.  States she is hoping to be able to move in with a woman in recovery she recently met, but if not  an option,  will return home . She states she plans to go to a rehab ( [facility]) once a bed becomes available. Denies medication side effects. She is currently on Neurontin and on Vistaril PRNs , states Neurontin has been significantly but partially effective, well tolerated, and is hoping to increase dose further. Side effects discussed . Will increase Neurontin from 400 mgrs TID to 600 mgrs TID, side effects, including risk of sedation, reviewed . Interacting appropriately with peers, cooperative on approach.    Cognitive Features That Contribute To Risk:  No gross cognitive deficits noted upon discharge. Is alert , attentive, and oriented x 3   Suicide Risk:  Mild:  Suicidal ideation of limited frequency, intensity, duration, and specificity.  There are no identifiable plans, no associated intent, mild dysphoria and related symptoms, good self-control (both objective and subjective assessment), few other risk factors, and identifiable protective factors, including available and accessible social support.  Follow-up [facility] Follow up.   Specialty:  Addiction Medicine Why:  Referral faxed: 8/5 Contact information: [place] [address] [phone]        [facility] Follow [facility].   Why:  Walk in within 3 days of hospital discharge to be assessed for [facility] services. Hospital discharge walk in times: Monday, Wednesday, Fridays between 11am-12:30pm. Thank you.  Contact information: [place], [place] [address] Phone: [phone] Fax: [phone]        [facility] Follow up on 03/17/2018.   Specialty:  Behavioral Health Why:  Hospital follow-up/medication management on Wednesday 8/14 at 8:00AM. Please bring photo ID, medicaid card, and hospital discharge paperwork to this appt. Thank you.  Contact information: [place] [address] [phone]           Plan Of Care/Follow-up  recommendations:  Activity:  as tolerated  Diet:  regular Tests:  NA Other:  See below  Patient is expressing readiness for discharge. She is planning to move in with a friend who is in recovery, but states she is unsure if this will be an option, in which case she will return home. She states she plans to go to NA meetings regularly, and to continue contacting [facility] rehab until she gets a bed there ( residential rehab). Follow up as above.  Jenne Campus, MD 03/12/2018, 12:54 PM

## 2018-03-19 NOTE — Progress Notes (Addendum)
Both pt and her mother contacted CSW requesting [facility] referral. Pt states she is not doing well and feels intense mood instability. She is not endorsing SI/HI/AVH. CSW called [facility] with pts' verbal permission to find out if they would accept a referral from the hospital for a discharged patient. Macally in admissions stated that they would review pt referral. CSW submitted referral and spoke with pt's mother and with pt--pt in review. Pt's mother given admissions number to check status over the weekend and CSW will call admissions Monday 8/19.  Faithlyn Recktenwald S. Alan RipperHolloway, MSW, LCSW Clinical Social Worker 03/19/2018 3:24 PM   CSW also advised pt and her mother that if her symptoms worsen, she should go to the ED for an assessment. Pt has been [facility] already and medications have been adjusted.  Linsey Arteaga S. Alan RipperHolloway, MSW, LCSW Clinical Social Worker 03/19/2018 3:25 PM

## 2018-04-09 ENCOUNTER — Encounter (HOSPITAL_BASED_OUTPATIENT_CLINIC_OR_DEPARTMENT_OTHER): Payer: Self-pay | Admitting: *Deleted

## 2018-04-09 ENCOUNTER — Other Ambulatory Visit: Payer: Self-pay

## 2018-04-09 ENCOUNTER — Emergency Department (HOSPITAL_BASED_OUTPATIENT_CLINIC_OR_DEPARTMENT_OTHER)
Admission: EM | Admit: 2018-04-09 | Discharge: 2018-04-09 | Disposition: A | Discharge status: Home / Self Care | Payer: Medicaid Other | Attending: Emergency Medicine | Admitting: Emergency Medicine

## 2018-04-09 DIAGNOSIS — L02211 Cutaneous abscess of abdominal wall: Secondary | ICD-10-CM | POA: Insufficient documentation

## 2018-04-09 DIAGNOSIS — L0291 Cutaneous abscess, unspecified: Secondary | ICD-10-CM

## 2018-04-09 DIAGNOSIS — Z79899 Other long term (current) drug therapy: Secondary | ICD-10-CM | POA: Insufficient documentation

## 2018-04-09 DIAGNOSIS — F17210 Nicotine dependence, cigarettes, uncomplicated: Secondary | ICD-10-CM | POA: Insufficient documentation

## 2018-04-09 MED ORDER — LIDOCAINE HCL (PF) 1 % IJ SOLN
5.0000 mL | Freq: Once | INTRAMUSCULAR | Status: DC
  Filled 2018-04-09: qty 5

## 2018-04-09 MED ORDER — ACETAMINOPHEN 500 MG PO TABS
1000.0000 mg | ORAL_TABLET | Freq: Once | ORAL | Status: AC
  Administered 2018-04-09: 1000 mg via ORAL
  Filled 2018-04-09: qty 2

## 2018-04-09 NOTE — ED Notes (Signed)
Induration to right lower quadrant with redness. PT denies fevers and drainage.

## 2018-04-09 NOTE — ED Triage Notes (Addendum)
Abscess to her lower right abdomen. She is a pt at [facility] for cocaine addiction.

## 2018-04-09 NOTE — Discharge Instructions (Addendum)
Apply warm compresses.  Make sure you are keeping the area clean and dry.  Return to emergency department for any worsening redness, swelling, drainage from the area, fever or any other symptoms.

## 2018-04-09 NOTE — ED Provider Notes (Signed)
[facility] EMERGENCY DEPARTMENT Provider Note   CSN: [id number] Arrival date & time: 04/09/18  1622     History   Chief Complaint Chief Complaint  Patient presents with  . Abscess    HPI Cynthia Odonnell is a 32 y.o. female has no history of cocaine abuse, anxiety who presents for evaluation of redness and swelling noted to the right lower abdomen.  She reports that she first noticed a small area of erythema about 2 days ago.  She states that it worsened last night.  She states that this morning, the area was more painful, prompting ED visit.  Patient reports that she is currently at [facility] for [facility] addiction and states that they made her come see evaluation.  She denies any fevers.  She has not noticed any drainage from the area.  He does not know of any bug bites but states that there have been bugs at the [facility] so she states it may have been possible.  Patient denies any IV drug use and denies any injection into the spot.  The history is provided by the patient.    Past Medical History:  Diagnosis Date  . Abnormal Pap smear of cervix   . Anxiety   . Arthritis   . Bronchitis    Hx - uses albuterol inhaler prn  . Cocaine abuse, in remission (HCC)   . Depression   . GERD (gastroesophageal reflux disease)    zantac prn  . Headache(784.0)    otc prn  . Optic neuritis    left eye  . Smoker   . Termination of pregnancy (fetus)    x 2    Patient Active Problem List   Diagnosis Date Noted  . Substance induced mood disorder (HCC) 03/06/2018  . Bipolar 1 disorder, mixed, severe (HCC) 03/05/2018  . Bipolar affective disorder (HCC)   . Bradycardia   . Cocaine abuse (HCC)   . Hypotension   . Suicide attempt (H[facility])   . H/O cesarean section 11/04/2014  . [redacted] weeks gestation of pregnancy   . Maternal morbid obesity in third trimester, antepartum (HCC)   . Excessive weight gain in pregnancy in second trimester   . [redacted] weeks gestation of pregnancy   .  Smoking   . Poor fetal growth affecting management of mother in third trimester, antepartum   . [redacted] weeks gestation of pregnancy   . Maternal morbid obesity in second trimester, antepartum (HCC)   . [redacted] weeks gestation of pregnancy   . Evaluate anatomy not seen on prior sonogram   . Tobacco smoking affecting pregnancy in second trimester, antepartum   . Obesity affecting pregnancy in second trimester   . Uterine scar from previous cesarean delivery, antepartum complication   . Encounter for fetal anatomic survey   . [redacted] weeks gestation of pregnancy     Past Surgical History:  Procedure Laterality Date  . CESAREAN SECTION  07/31/2008   [facility]  . CESAREAN SECTION N/A 11/04/2014   Procedure: CESAREAN SECTION;  Surgeon: Tracey Harries, MD;  Location: [facility];  Service: Obstetrics;  Laterality: N/A;  . COLPOSCOPY    . WISDOM TOOTH EXTRACTION       OB History    Gravida  4   Para  2   Term  2   Preterm      AB  2   Living  2     SAB      TAB  2  Ectopic      Multiple  0   Live Births  2            Home Medications    Prior to Admission medications   Medication Sig Start Date End Date Taking? Authorizing Provider  etonogestrel (NEXPLANON) 68 MG IMPL implant Inject 66 mg into the skin.    [provider]  gabapentin (NEURONTIN) 300 MG capsule Take 2 capsules (600 mg total) by mouth 3 (three) times daily. 03/12/18   Truman Hayward, FNP  hydrOXYzine (ATARAX/VISTARIL) 50 MG tablet Take 1 tablet (50 mg total) by mouth every 6 (six) hours as needed for anxiety. 03/12/18   Truman Hayward, FNP  nicotine polacrilex (NICORETTE) 2 MG gum Take 1 each (2 mg total) by mouth as needed for smoking cessation. 03/12/18   Truman Hayward, FNP  traZODone (DESYREL) 50 MG tablet Take 1 tablet (50 mg total) by mouth at bedtime as needed for sleep. 03/12/18   Truman Hayward, FNP    Family History Family History  Problem Relation Age of Onset  . Other Neg Hx     Social  History Social History   Tobacco Use  . Smoking status: Current Every Day Smoker    Packs/day: 1.00    Years: 16.00    Pack years: 16.00    Types: Cigarettes  . Smokeless tobacco: Never Used  Substance Use Topics  . Alcohol use: No  . Drug use: Yes    Frequency: 3.0 times per week    Types: Cocaine    Comment: states "usually 2 grams at at time"     Allergies   Amoxicillin; Codeine; Latex; Penicillins; Prednisone; Augmentin [amoxicillin-pot clavulanate]; Lamictal [lamotrigine]; and Tramadol   Review of Systems Review of Systems  Constitutional: Negative for fever.  Skin: Positive for color change and wound.  All other systems reviewed and are negative.    Physical Exam Updated Vital Signs BP (!) 106/56   Pulse 66   Temp 98.7 F (37.1 C) (Oral)   Resp 20   Ht 5\' 6"  (1.676 m)   Wt 103.9 kg   LMP 04/08/2018   SpO2 100%   BMI 36.97 kg/m   Physical Exam  Constitutional: She appears well-developed and well-nourished.  HENT:  Head: Normocephalic and atraumatic.  Eyes: Conjunctivae and EOM are normal. Right eye exhibits no discharge. Left eye exhibits no discharge. No scleral icterus.  Pulmonary/Chest: Effort normal.  Neurological: She is alert.  Skin: Skin is warm and dry. Capillary refill takes less than 2 seconds.  1.5 cm area of erythema and fluctuance with some surrounding erythema and warmth. No induration.   Psychiatric: She has a normal mood and affect. Her speech is normal and behavior is normal.  Nursing note and vitals reviewed.    ED Treatments / Results  Labs (all labs ordered are listed, but only abnormal results are displayed) Labs Reviewed - No data to display  EKG None  Radiology No results found.  Procedures .Marland KitchenIncision and Drainage Date/Time: 04/09/2018 5:12 PM Performed by: Maxwell Caul, PA-C Authorized by: Maxwell Caul, PA-C   Consent:    Consent obtained:  Verbal   Consent given by:  Patient   Risks discussed:   Incomplete drainage and bleeding Location:    Type:  Abscess   Location:  Trunk   Trunk location:  Abdomen Pre-procedure details:    Skin preparation:  Betadine Anesthesia (see MAR for exact dosages):    Anesthesia method:  Local infiltration   Local anesthetic:  Lidocaine 1% w/o epi Procedure type:    Complexity:  Simple Procedure details:    Incision types:  Stab incision   Scalpel blade:  11   Wound management:  Irrigated with saline   Drainage:  Serosanguinous   Drainage amount:  Scant   Wound treatment:  Wound left open   (including critical care time)  Medications Ordered in ED Medications  lidocaine (PF) (XYLOCAINE) 1 % injection 5 mL (has no administration in time range)  acetaminophen (TYLENOL) tablet 1,000 mg (has no administration in time range)     Initial Impression / Assessment and Plan / ED Course  I have reviewed the triage vital signs and the nursing notes.  Pertinent labs & imaging results that were available during my care of the patient were reviewed by me and considered in my medical decision making (see chart for details).     32 year old female who presents for evaluation of area of pain, redness, swelling to the right lower abdomen that is been on there since the last 2 days.  No fevers.  Reports that she is currently at [facility] and states that she has been on bug but does not know if anything bit her.  No fevers. Patient is afebrile, non-toxic appearing, sitting comfortably on examination table. Vital signs reviewed and stable.  On exam, there is a small 1.5 cm area of edema and fluctuance.  Bedside ultrasound applied showed a small 1 cm x 0.5 cm deep area of collection of fluid.  Unclear if this is part of right wrist hematoma versus abscess.  Discussed treatment options with patient.  Patient would like it to be opened up and drained today here in the ED.  I&D as document above.  There is a small amount of serosanguineous fluid.   No purulent drainage.  No surrounding signs of cellulitis.  No indication for antibiotic therapy.  Encourage at home supportive care measures. Patient had ample opportunity for questions and discussion. All patient's questions were answered with full understanding. Strict return precautions discussed. Patient expresses understanding and agreement to plan.    EMERGENCY DEPARTMENT US SOFT TISSUE INTERPRETATION "Study: Limited Soft Tissue Ultrasound"  INDICATIONS: Pain Multiple views of the body part were obtained in real-time with a multi-frequency linear probe  PERFORMED BY: Myself IMAGES ARCHIVED?: No SIDE:Right  BODY PART:Abdominal wall INTERPRETATION:  Abcess present     Final Clinical Impressions(s) / ED Diagnoses   Final diagnoses:  Abscess    ED Discharge Orders    None       Rosana Hoes 04/09/18 1715    Little, Ambrose Finland, MD 04/10/18 1451

## 2018-07-24 ENCOUNTER — Emergency Department (HOSPITAL_BASED_OUTPATIENT_CLINIC_OR_DEPARTMENT_OTHER)
Admission: EM | Admit: 2018-07-24 | Discharge: 2018-07-24 | Disposition: A | Discharge status: Home / Self Care | Payer: Medicaid Other | Attending: Emergency Medicine | Admitting: Emergency Medicine

## 2018-07-24 ENCOUNTER — Other Ambulatory Visit: Payer: Self-pay

## 2018-07-24 ENCOUNTER — Encounter (HOSPITAL_BASED_OUTPATIENT_CLINIC_OR_DEPARTMENT_OTHER): Payer: Self-pay | Admitting: Emergency Medicine

## 2018-07-24 DIAGNOSIS — Z79899 Other long term (current) drug therapy: Secondary | ICD-10-CM | POA: Insufficient documentation

## 2018-07-24 DIAGNOSIS — F1410 Cocaine abuse, uncomplicated: Secondary | ICD-10-CM | POA: Insufficient documentation

## 2018-07-24 DIAGNOSIS — Z91040 Latex allergy status: Secondary | ICD-10-CM | POA: Insufficient documentation

## 2018-07-24 DIAGNOSIS — F319 Bipolar disorder, unspecified: Secondary | ICD-10-CM | POA: Insufficient documentation

## 2018-07-24 DIAGNOSIS — B09 Unspecified viral infection characterized by skin and mucous membrane lesions: Secondary | ICD-10-CM | POA: Insufficient documentation

## 2018-07-24 DIAGNOSIS — F419 Anxiety disorder, unspecified: Secondary | ICD-10-CM | POA: Insufficient documentation

## 2018-07-24 DIAGNOSIS — J069 Acute upper respiratory infection, unspecified: Secondary | ICD-10-CM | POA: Insufficient documentation

## 2018-07-24 DIAGNOSIS — F17210 Nicotine dependence, cigarettes, uncomplicated: Secondary | ICD-10-CM | POA: Insufficient documentation

## 2018-07-24 DIAGNOSIS — R21 Rash and other nonspecific skin eruption: Secondary | ICD-10-CM

## 2018-07-24 MED ORDER — AZITHROMYCIN 250 MG PO TABS: 250.0000 mg | ORAL_TABLET | Freq: Every day | ORAL | 0 refills | Status: DC

## 2018-07-24 MED ORDER — DEXAMETHASONE 4 MG PO TABS
10.0000 mg | ORAL_TABLET | Freq: Once | ORAL | Status: AC
  Administered 2018-07-24: 10 mg via ORAL
  Filled 2018-07-24: qty 1

## 2018-07-24 MED ORDER — ALBUTEROL SULFATE HFA 108 (90 BASE) MCG/ACT IN AERS
2.0000 | INHALATION_SPRAY | Freq: Once | RESPIRATORY_TRACT | Status: AC
  Administered 2018-07-24: 2 via RESPIRATORY_TRACT
  Filled 2018-07-24: qty 6.7

## 2018-07-24 NOTE — ED Provider Notes (Signed)
[facility] EMERGENCY DEPARTMENT Provider Note   CSN: [id number] Arrival date & time: 07/24/18  1026     History   Chief Complaint Chief Complaint  Patient presents with  . Cough  . Rash    HPI Cynthia Odonnell is a 32 y.o. female.  HPI 32 year old female with past medical history as below including ongoing smoking, history of bronchitis, here with shortness of breath and rash.  The patient states she has known sick contacts with reported scarlet fever.  Her husband also recently had some pneumonia.  She states that over the last 2 weeks, her symptoms have progressed from a mild, dry cough with diffuse red rash, to body aches and general fatigue.  She is had coughing and increased wheezing.  Her rash is slightly raised and worse on the face but also present on the trunk.  No rash on the legs.  Denies recent tick bites.  No new allergen exposures.  She denies any nausea, vomiting, or diarrhea.  No chest pain.  Her cough is dry, wheezing, and seems to be worse when she goes in the cold weather.  No alleviating factors.  She has not tried inhaler.  She continues to smoke.  Past Medical History:  Diagnosis Date  . Abnormal Pap smear of cervix   . Anxiety   . Arthritis   . Bronchitis    Hx - uses albuterol inhaler prn  . Cocaine abuse, in remission (HCC)   . Depression   . GERD (gastroesophageal reflux disease)    zantac prn  . Headache(784.0)    otc prn  . Optic neuritis    left eye  . Smoker   . Termination of pregnancy (fetus)    x 2    Patient Active Problem List   Diagnosis Date Noted  . Substance induced mood disorder (HCC) 03/06/2018  . Bipolar 1 disorder, mixed, severe (HCC) 03/05/2018  . Bipolar affective disorder (HCC)   . Bradycardia   . Cocaine abuse (HCC)   . Hypotension   . Suicide attempt (HCC)   . H/O cesarean section 11/04/2014  . [redacted] weeks gestation of pregnancy   . Maternal morbid obesity in third trimester, antepartum (HCC)   . Excessive  weight gain in pregnancy in second trimester   . [redacted] weeks gestation of pregnancy   . Smoking   . Poor fetal growth affecting management of mother in third trimester, antepartum   . [redacted] weeks gestation of pregnancy   . Maternal morbid obesity in second trimester, antepartum (HCC)   . [redacted] weeks gestation of pregnancy   . Evaluate anatomy not seen on prior sonogram   . Tobacco smoking affecting pregnancy in second trimester, antepartum   . Obesity affecting pregnancy in second trimester   . Uterine scar from previous cesarean delivery, antepartum complication   . Encounter for fetal anatomic survey   . [redacted] weeks gestation of pregnancy     Past Surgical History:  Procedure Laterality Date  . CESAREAN SECTION  07/31/2008   [facility]  . CESAREAN SECTION N/A 11/04/2014   Procedure: CESAREAN SECTION;  Surgeon: Tracey Harries, MD;  Location: [facility];  Service: Obstetrics;  Laterality: N/A;  . COLPOSCOPY    . WISDOM TOOTH EXTRACTION       OB History    Gravida  4   Para  2   Term  2   Preterm      AB  2   Living  2  SAB      TAB  2   Ectopic      Multiple  0   Live Births  2            Home Medications    Prior to Admission medications   Medication Sig Start Date End Date Taking? Authorizing Provider  risperiDONE (RISPERDAL) 1 MG tablet Take 1 mg by mouth at bedtime.   Yes [provider]  azithromycin (ZITHROMAX) 250 MG tablet Take 1 tablet (250 mg total) by mouth daily. Take first 2 tablets together, then 1 every day until finished. 07/24/18   Shaune PollackIsaacs, Seema Blum, MD  etonogestrel (NEXPLANON) 68 MG IMPL implant Inject 66 mg into the skin.    [provider]  gabapentin (NEURONTIN) 300 MG capsule Take 2 capsules (600 mg total) by mouth 3 (three) times daily. 03/12/18   Starkes-Perry, Juel Burrowakia S, FNP  hydrOXYzine (ATARAX/VISTARIL) 50 MG tablet Take 1 tablet (50 mg total) by mouth every 6 (six) hours as needed for anxiety. 03/12/18   Starkes-Perry, Juel Burrowakia S, FNP    nicotine polacrilex (NICORETTE) 2 MG gum Take 1 each (2 mg total) by mouth as needed for smoking cessation. 03/12/18   Starkes-Perry, Juel Burrowakia S, FNP  traZODone (DESYREL) 50 MG tablet Take 1 tablet (50 mg total) by mouth at bedtime as needed for sleep. 03/12/18   Maryagnes AmosStarkes-Perry, Takia S, FNP    Family History Family History  Problem Relation Age of Onset  . Other Neg Hx     Social History Social History   Tobacco Use  . Smoking status: Current Every Day Smoker    Packs/day: 1.00    Years: 16.00    Pack years: 16.00    Types: Cigarettes  . Smokeless tobacco: Never Used  Substance Use Topics  . Alcohol use: No  . Drug use: Yes    Frequency: 3.0 times per week    Types: Cocaine    Comment: states "usually 2 grams at at time"     Allergies   Amoxicillin; Codeine; Latex; Penicillins; Prednisone; Augmentin [amoxicillin-pot clavulanate]; Lamictal [lamotrigine]; and Tramadol   Review of Systems Review of Systems  Constitutional: Positive for fatigue. Negative for chills and fever.  HENT: Negative for congestion and rhinorrhea.   Eyes: Negative for visual disturbance.  Respiratory: Positive for cough, shortness of breath and wheezing.   Cardiovascular: Negative for chest pain and leg swelling.  Gastrointestinal: Negative for abdominal pain, diarrhea, nausea and vomiting.  Genitourinary: Negative for dysuria and flank pain.  Musculoskeletal: Negative for neck pain and neck stiffness.  Skin: Positive for rash. Negative for wound.  Allergic/Immunologic: Negative for immunocompromised state.  Neurological: Negative for syncope, weakness and headaches.  All other systems reviewed and are negative.    Physical Exam Updated Vital Signs BP 134/81   Pulse 89   Temp 98 F (36.7 C) (Oral)   Resp 20   Ht 5\' 6"  (1.676 m)   Wt 113.4 kg   LMP 07/15/2018   SpO2 97%   BMI 40.35 kg/m   Physical Exam Vitals signs and nursing note reviewed.  Constitutional:      General: She is not in  acute distress.    Appearance: She is well-developed.  HENT:     Head: Normocephalic and atraumatic.     Comments: 3+ tonsillar swelling, with small exudates, no peritonsillar edema or asymmetry, uvula is midline Eyes:     Conjunctiva/sclera: Conjunctivae normal.  Neck:     Musculoskeletal: Neck supple.  Cardiovascular:  Rate and Rhythm: Normal rate and regular rhythm.     Heart sounds: Normal heart sounds. No murmur. No friction rub.  Pulmonary:     Effort: Pulmonary effort is normal. No respiratory distress.     Breath sounds: Wheezing (Scant, expiratory only) present. No rales.  Abdominal:     General: There is no distension.     Palpations: Abdomen is soft.     Tenderness: There is no abdominal tenderness.  Skin:    General: Skin is warm.     Capillary Refill: Capillary refill takes less than 2 seconds.     Findings: Rash (Sparse, slightly raised, well demarcated, rough rash noted across the face, trunk, and bilateral upper extremities, no induration, no fluctuance) present.  Neurological:     Mental Status: She is alert and oriented to person, place, and time.     Motor: No abnormal muscle tone.      ED Treatments / Results  Labs (all labs ordered are listed, but only abnormal results are displayed) Labs Reviewed - No data to display  EKG None  Radiology No results found.  Procedures Procedures (including critical care time)  Medications Ordered in ED Medications  dexamethasone (DECADRON) tablet 10 mg (10 mg Oral Given 07/24/18 1110)  albuterol (PROVENTIL HFA;VENTOLIN HFA) 108 (90 Base) MCG/ACT inhaler 2 puff (2 puffs Inhalation Given 07/24/18 1135)     Initial Impression / Assessment and Plan / ED Course  I have reviewed the triage vital signs and the nursing notes.  Pertinent labs & imaging results that were available during my care of the patient were reviewed by me and considered in my medical decision making (see chart for details).     32 yo F  with PMHx as above here with scarletiniform rash, sore throat, but also cough. Suspect possible scarlet fever versus viral URi with exanthem. Her cough has some mild wheezing as well and given h/o smoking, suspect a component of underlying RAD. She is otherwise very well appearing and in NAD. No hypoxia, normal WOB and no fever. She has an amox allergy so will Rx Azithro, which should also cover for possible early atypical PNA. Will refer for outpt follow-up. Albuterol inhaler provided.  Final Clinical Impressions(s) / ED Diagnoses   Final diagnoses:  Upper respiratory tract infection, unspecified type  Exanthem    ED Discharge Orders         Ordered    azithromycin (ZITHROMAX) 250 MG tablet  Daily     07/24/18 1138           Shaune PollackIsaacs, Legrande Hao, MD 07/24/18 1145

## 2018-07-24 NOTE — ED Triage Notes (Signed)
Cough, fever, red rash to face x 2 weeks.

## 2018-07-29 ENCOUNTER — Encounter (HOSPITAL_BASED_OUTPATIENT_CLINIC_OR_DEPARTMENT_OTHER): Payer: Self-pay | Admitting: Emergency Medicine

## 2018-07-29 ENCOUNTER — Emergency Department (HOSPITAL_BASED_OUTPATIENT_CLINIC_OR_DEPARTMENT_OTHER)
Admission: EM | Admit: 2018-07-29 | Discharge: 2018-07-29 | Disposition: A | Discharge status: Home / Self Care | Payer: Self-pay | Attending: Emergency Medicine | Admitting: Emergency Medicine

## 2018-07-29 ENCOUNTER — Other Ambulatory Visit: Payer: Self-pay

## 2018-07-29 ENCOUNTER — Emergency Department (HOSPITAL_BASED_OUTPATIENT_CLINIC_OR_DEPARTMENT_OTHER): Payer: Self-pay

## 2018-07-29 DIAGNOSIS — Z91040 Latex allergy status: Secondary | ICD-10-CM | POA: Insufficient documentation

## 2018-07-29 DIAGNOSIS — J069 Acute upper respiratory infection, unspecified: Secondary | ICD-10-CM

## 2018-07-29 DIAGNOSIS — Z79899 Other long term (current) drug therapy: Secondary | ICD-10-CM | POA: Insufficient documentation

## 2018-07-29 DIAGNOSIS — F17210 Nicotine dependence, cigarettes, uncomplicated: Secondary | ICD-10-CM | POA: Insufficient documentation

## 2018-07-29 LAB — PREGNANCY, URINE: PREG TEST UR: NEGATIVE

## 2018-07-29 MED ORDER — KETOROLAC TROMETHAMINE 60 MG/2ML IM SOLN
60.0000 mg | Freq: Once | INTRAMUSCULAR | Status: AC
  Administered 2018-07-29: 60 mg via INTRAMUSCULAR
  Filled 2018-07-29: qty 2

## 2018-07-29 MED ORDER — DEXAMETHASONE 6 MG PO TABS
10.0000 mg | ORAL_TABLET | Freq: Once | ORAL | Status: AC
  Administered 2018-07-29: 10 mg via ORAL
  Filled 2018-07-29: qty 1

## 2018-07-29 NOTE — ED Provider Notes (Signed)
[facility] EMERGENCY DEPARTMENT Provider Note   CSN: [id number] Arrival date & time: 07/29/18  1350     History   Chief Complaint Chief Complaint  Patient presents with  . Chest Pain    HPI Cynthia Odonnell is a [age] y.o. female.  The history is provided by the patient.  Cough  This is a new problem. The current episode started more than 2 days ago. The problem occurs constantly. The problem has not changed since onset.The cough is non-productive. There has been no fever. The fever has been present for less than 1 day. Associated symptoms include chest pain, sore throat and myalgias. Pertinent negatives include no chills, no ear pain and no shortness of breath. Treatments tried: finished anitbiotics for scarlet fever. The treatment provided mild relief. She is a smoker.    Past Medical History:  Diagnosis Date  . Abnormal Pap smear of cervix   . Anxiety   . Arthritis   . Bronchitis    Hx - uses albuterol inhaler prn  . Cocaine abuse, in remission (HCC)   . Depression   . GERD (gastroesophageal reflux disease)    zantac prn  . Headache(784.0)    otc prn  . Optic neuritis    left eye  . Smoker   . Termination of pregnancy (fetus)    x 2    Patient Active Problem List   Diagnosis Date Noted  . Substance induced mood disorder (HCC) 03/06/2018  . Bipolar 1 disorder, mixed, severe (HCC) 03/05/2018  . Bipolar affective disorder (HCC)   . Bradycardia   . Cocaine abuse (HCC)   . Hypotension   . Suicide attempt (HCC)   . H/O cesarean section 11/04/2014  . [redacted] weeks gestation of pregnancy   . Maternal morbid obesity in third trimester, antepartum (HCC)   . Excessive weight gain in pregnancy in second trimester   . [redacted] weeks gestation of pregnancy   . Smoking   . Poor fetal growth affecting management of mother in third trimester, antepartum   . [redacted] weeks gestation of pregnancy   . Maternal morbid obesity in second trimester, antepartum (HCC)   . [redacted] weeks  gestation of pregnancy   . Evaluate anatomy not seen on prior sonogram   . Tobacco smoking affecting pregnancy in second trimester, antepartum   . Obesity affecting pregnancy in second trimester   . Uterine scar from previous cesarean delivery, antepartum complication   . Encounter for fetal anatomic survey   . [redacted] weeks gestation of pregnancy     Past Surgical History:  Procedure Laterality Date  . CESAREAN SECTION  07/31/2008   [facility]  . CESAREAN SECTION N/A 11/04/2014   Procedure: CESAREAN SECTION;  Surgeon: Tracey Harrieshomas Henley, MD;  Location: [facility];  Service: Obstetrics;  Laterality: N/A;  . COLPOSCOPY    . WISDOM TOOTH EXTRACTION       OB History    Gravida  4   Para  2   Term  2   Preterm      AB  2   Living  2     SAB      TAB  2   Ectopic      Multiple  0   Live Births  2            Home Medications    Prior to Admission medications   Medication Sig Start Date End Date Taking? Authorizing Provider  azithromycin (ZITHROMAX) 250 MG tablet Take 1  tablet (250 mg total) by mouth daily. Take first 2 tablets together, then 1 every day until finished. 07/24/18   Shaune Pollack, MD  etonogestrel (NEXPLANON) 68 MG IMPL implant Inject 66 mg into the skin.    [provider]  gabapentin (NEURONTIN) 300 MG capsule Take 2 capsules (600 mg total) by mouth 3 (three) times daily. 03/12/18   Starkes-Perry, Juel Burrow, FNP  hydrOXYzine (ATARAX/VISTARIL) 50 MG tablet Take 1 tablet (50 mg total) by mouth every 6 (six) hours as needed for anxiety. 03/12/18   Starkes-Perry, Juel Burrow, FNP  nicotine polacrilex (NICORETTE) 2 MG gum Take 1 each (2 mg total) by mouth as needed for smoking cessation. 03/12/18   Starkes-Perry, Juel Burrow, FNP  risperiDONE (RISPERDAL) 1 MG tablet Take 1 mg by mouth at bedtime.    [provider]  traZODone (DESYREL) 50 MG tablet Take 1 tablet (50 mg total) by mouth at bedtime as needed for sleep. 03/12/18   Maryagnes Amos, FNP    Family  History Family History  Problem Relation Age of Onset  . Other Neg Hx     Social History Social History   Tobacco Use  . Smoking status: Current Every Day Smoker    Packs/day: 1.00    Years: 16.00    Pack years: 16.00    Types: Cigarettes  . Smokeless tobacco: Never Used  Substance Use Topics  . Alcohol use: No  . Drug use: Yes    Frequency: 3.0 times per week    Types: Cocaine    Comment: states "usually 2 grams at at time"     Allergies   Amoxicillin; Codeine; Latex; Penicillins; Prednisone; Augmentin [amoxicillin-pot clavulanate]; Lamictal [lamotrigine]; and Tramadol   Review of Systems Review of Systems  Constitutional: Negative for chills and fever.  HENT: Positive for sore throat. Negative for ear pain.   Eyes: Negative for pain and visual disturbance.  Respiratory: Positive for cough. Negative for shortness of breath.   Cardiovascular: Positive for chest pain. Negative for palpitations.  Gastrointestinal: Negative for abdominal pain and vomiting.  Genitourinary: Negative for dysuria and hematuria.  Musculoskeletal: Positive for myalgias. Negative for arthralgias and back pain.  Skin: Negative for color change and rash.  Neurological: Negative for seizures and syncope.  All other systems reviewed and are negative.    Physical Exam Updated Vital Signs BP 124/88 (BP Location: Right Arm)   Pulse 68   Temp 98.2 F (36.8 C) (Oral)   Resp 16   Ht 5\' 6"  (1.676 m)   Wt 113 kg   LMP 07/15/2018   SpO2 99%   BMI 40.21 kg/m   Physical Exam Vitals signs and nursing note reviewed.  Constitutional:      General: She is not in acute distress.    Appearance: She is well-developed.  HENT:     Head: Normocephalic and atraumatic.  Eyes:     Extraocular Movements: Extraocular movements intact.     Conjunctiva/sclera: Conjunctivae normal.     Pupils: Pupils are equal, round, and reactive to light.  Neck:     Musculoskeletal: Normal range of motion and neck  supple.  Cardiovascular:     Rate and Rhythm: Normal rate and regular rhythm.     Pulses:          Radial pulses are 2+ on the right side and 2+ on the left side.     Heart sounds: No murmur.  Pulmonary:     Effort: Pulmonary effort is normal. No  respiratory distress.     Breath sounds: Normal breath sounds. No decreased breath sounds, wheezing, rhonchi or rales.  Chest:     Chest wall: Tenderness present.  Abdominal:     Palpations: Abdomen is soft.     Tenderness: There is no abdominal tenderness.  Musculoskeletal: Normal range of motion.     Right lower leg: No edema.     Left lower leg: No edema.  Skin:    General: Skin is warm and dry.     Capillary Refill: Capillary refill takes less than 2 seconds.  Neurological:     General: No focal deficit present.     Mental Status: She is alert.      ED Treatments / Results  Labs (all labs ordered are listed, but only abnormal results are displayed) Labs Reviewed  PREGNANCY, URINE    EKG EKG Interpretation  Date/Time:  Thursday July 29 2018 14:00:05 EST Ventricular Rate:  80 PR Interval:  110 QRS Duration: 78 QT Interval:  374 QTC Calculation: 431 R Axis:   40 Text Interpretation:  Sinus rhythm with short PR Otherwise normal ECG Confirmed by Virgina NorfolkAdam, Dilraj Killgore [phone]([id number]) on 07/29/2018 2:11:42 PM   Radiology Dg Chest 2 View  Result Date: 07/29/2018 CLINICAL DATA:  Cough EXAM: CHEST - 2 VIEW COMPARISON:  09/21/2017 FINDINGS: Heart and mediastinal contours are within normal limits. No focal opacities or effusions. No acute bony abnormality. IMPRESSION: No active cardiopulmonary disease. Electronically Signed   By: Charlett NoseKevin  Dover M.D.   On: 07/29/2018 14:23    Procedures Procedures (including critical care time)  Medications Ordered in ED Medications  dexamethasone (DECADRON) tablet 10 mg (10 mg Oral Given 07/29/18 1458)  ketorolac (TORADOL) injection 60 mg (60 mg Intramuscular Given 07/29/18 1459)     Initial  Impression / Assessment and Plan / ED Course  I have reviewed the triage vital signs and the nursing notes.  Pertinent labs & imaging results that were available during my care of the patient were reviewed by me and considered in my medical decision making (see chart for details).     Cynthia Diesara E Stauch is a 32 year old female with history of anxiety, bronchitis who presents to the ED with cough.  Patient with normal vitals.  No fever.  Patient just recently treated for infection and finished antibiotics.  She continues to have hoarse voice.  She has no signs of throat or ear infection on exam.  Clear breath sounds bilaterally.  Chest x-ray showed no signs of pneumonia, pneumothorax, pleural effusion.  EKG showed sinus rhythm.  Patient had chest wall tenderness on exam likely from coughing as she states that pain is only when she coughs. Doubt ACS, no recent cocaine abuse. Patient was given Toradol, Decadron.  Recommend increase hydration at home.  Likely ongoing viral process.  Told to continue Tylenol, Motrin.  Given return precautions and discharged from the ED in good condition.  Final Clinical Impressions(s) / ED Diagnoses   Final diagnoses:  Upper respiratory tract infection, unspecified type    ED Discharge Orders    None       Virgina NorfolkCuratolo, Christinea Brizuela, DO 07/29/18 1548

## 2018-07-29 NOTE — ED Triage Notes (Addendum)
Pt seen here 1 week ago with URI symptoms, now c/o chest pain since yesterday. Pt with hoarse voice and coughing. Last cocaine use 3 days ago.

## 2018-08-16 ENCOUNTER — Other Ambulatory Visit: Payer: Self-pay

## 2018-08-16 ENCOUNTER — Encounter (HOSPITAL_BASED_OUTPATIENT_CLINIC_OR_DEPARTMENT_OTHER): Payer: Self-pay | Admitting: Emergency Medicine

## 2018-08-16 ENCOUNTER — Emergency Department (HOSPITAL_BASED_OUTPATIENT_CLINIC_OR_DEPARTMENT_OTHER): Payer: Self-pay

## 2018-08-16 ENCOUNTER — Emergency Department (HOSPITAL_BASED_OUTPATIENT_CLINIC_OR_DEPARTMENT_OTHER)
Admission: EM | Admit: 2018-08-16 | Discharge: 2018-08-16 | Disposition: A | Discharge status: Home / Self Care | Payer: Self-pay | Attending: Emergency Medicine | Admitting: Emergency Medicine

## 2018-08-16 DIAGNOSIS — M25561 Pain in right knee: Secondary | ICD-10-CM | POA: Insufficient documentation

## 2018-08-16 DIAGNOSIS — Z91040 Latex allergy status: Secondary | ICD-10-CM | POA: Insufficient documentation

## 2018-08-16 DIAGNOSIS — F1410 Cocaine abuse, uncomplicated: Secondary | ICD-10-CM | POA: Insufficient documentation

## 2018-08-16 DIAGNOSIS — F17210 Nicotine dependence, cigarettes, uncomplicated: Secondary | ICD-10-CM | POA: Insufficient documentation

## 2018-08-16 DIAGNOSIS — Z79899 Other long term (current) drug therapy: Secondary | ICD-10-CM | POA: Insufficient documentation

## 2018-08-16 MED ORDER — IBUPROFEN 800 MG PO TABS: 800.0000 mg | ORAL_TABLET | Freq: Three times a day (TID) | ORAL | 0 refills | Status: DC

## 2018-08-16 MED ORDER — ACETAMINOPHEN 500 MG PO TABS: 500.0000 mg | ORAL_TABLET | Freq: Four times a day (QID) | ORAL | 0 refills | Status: DC | PRN

## 2018-08-16 NOTE — ED Provider Notes (Signed)
[facility] EMERGENCY DEPARTMENT Provider Note   CSN: [id number] Arrival date & time: 08/16/18  [id number]     History   Chief Complaint Chief Complaint  Patient presents with  . Knee Injury    HPI Cynthia Odonnell is a 33 y.o. female with history of anxiety, depression, cocaine abuse in remission, bipolar 1 disorder who presents with right knee pain after reported dislocation 3 times.  Patient describes a patellar dislocation that occurred 3 times in the last few days.  She reports she knelt down and it happened the first time.  And then she reports an episode of just stepping out of bed and it dislocated.  She denies any significant numbness or tingling.  She denies any fevers.  She has taken ibuprofen at home without relief.  Patient denies any trauma.  She reports she had history of patellar dislocation as a child, however it has not happened since she was in fourth grade.  HPI  Past Medical History:  Diagnosis Date  . Abnormal Pap smear of cervix   . Anxiety   . Arthritis   . Bronchitis    Hx - uses albuterol inhaler prn  . Cocaine abuse, in remission (HCC)   . Depression   . GERD (gastroesophageal reflux disease)    zantac prn  . Headache(784.0)    otc prn  . Optic neuritis    left eye  . Smoker   . Termination of pregnancy (fetus)    x 2    Patient Active Problem List   Diagnosis Date Noted  . Substance induced mood disorder (HCC) 03/06/2018  . Bipolar 1 disorder, mixed, severe (HCC) 03/05/2018  . Bipolar affective disorder (HCC)   . Bradycardia   . Cocaine abuse (HCC)   . Hypotension   . Suicide attempt (HCC)   . H/O cesarean section 11/04/2014  . [redacted] weeks gestation of pregnancy   . Maternal morbid obesity in third trimester, antepartum (HCC)   . Excessive weight gain in pregnancy in second trimester   . [redacted] weeks gestation of pregnancy   . Smoking   . Poor fetal growth affecting management of mother in third trimester, antepartum   . [redacted] weeks  gestation of pregnancy   . Maternal morbid obesity in second trimester, antepartum (HCC)   . [redacted] weeks gestation of pregnancy   . Evaluate anatomy not seen on prior sonogram   . Tobacco smoking affecting pregnancy in second trimester, antepartum   . Obesity affecting pregnancy in second trimester   . Uterine scar from previous cesarean delivery, antepartum complication   . Encounter for fetal anatomic survey   . [redacted] weeks gestation of pregnancy     Past Surgical History:  Procedure Laterality Date  . CESAREAN SECTION  07/31/2008   [facility]  . CESAREAN SECTION N/A 11/04/2014   Procedure: CESAREAN SECTION;  Surgeon: Tracey Harrieshomas Henley, MD;  Location: [facility];  Service: Obstetrics;  Laterality: N/A;  . COLPOSCOPY    . WISDOM TOOTH EXTRACTION       OB History    Gravida  4   Para  2   Term  2   Preterm      AB  2   Living  2     SAB      TAB  2   Ectopic      Multiple  0   Live Births  2            Home Medications  Prior to Admission medications   Medication Sig Start Date End Date Taking? Authorizing Provider  acetaminophen (TYLENOL) 500 MG tablet Take 1 tablet (500 mg total) by mouth every 6 (six) hours as needed. 08/16/18   Ajit Errico, Waylan Boga, PA-C  azithromycin (ZITHROMAX) 250 MG tablet Take 1 tablet (250 mg total) by mouth daily. Take first 2 tablets together, then 1 every day until finished. 07/24/18   Shaune Pollack, MD  etonogestrel (NEXPLANON) 68 MG IMPL implant Inject 66 mg into the skin.    [provider]  gabapentin (NEURONTIN) 300 MG capsule Take 2 capsules (600 mg total) by mouth 3 (three) times daily. 03/12/18   Starkes-Perry, Juel Burrow, FNP  hydrOXYzine (ATARAX/VISTARIL) 50 MG tablet Take 1 tablet (50 mg total) by mouth every 6 (six) hours as needed for anxiety. 03/12/18   Starkes-Perry, Juel Burrow, FNP  ibuprofen (ADVIL,MOTRIN) 800 MG tablet Take 1 tablet (800 mg total) by mouth 3 (three) times daily. 08/16/18   Taliyah Watrous, Waylan Boga, PA-C  nicotine polacrilex  (NICORETTE) 2 MG gum Take 1 each (2 mg total) by mouth as needed for smoking cessation. 03/12/18   Starkes-Perry, Juel Burrow, FNP  risperiDONE (RISPERDAL) 1 MG tablet Take 1 mg by mouth at bedtime.    [provider]  traZODone (DESYREL) 50 MG tablet Take 1 tablet (50 mg total) by mouth at bedtime as needed for sleep. 03/12/18   Maryagnes Amos, FNP    Family History Family History  Problem Relation Age of Onset  . Other Neg Hx     Social History Social History   Tobacco Use  . Smoking status: Current Every Day Smoker    Packs/day: 1.00    Years: 16.00    Pack years: 16.00    Types: Cigarettes  . Smokeless tobacco: Never Used  Substance Use Topics  . Alcohol use: No  . Drug use: Yes    Frequency: 3.0 times per week    Types: Cocaine    Comment: states "usually 2 grams at at time"     Allergies   Amoxicillin; Codeine; Latex; Penicillins; Prednisone; Augmentin [amoxicillin-pot clavulanate]; Lamictal [lamotrigine]; and Tramadol   Review of Systems Review of Systems  Constitutional: Negative for fever.  Musculoskeletal: Positive for arthralgias and joint swelling.  Neurological: Negative for numbness.     Physical Exam Updated Vital Signs BP 124/74 (BP Location: Right Arm)   Pulse 75   Temp 98.2 F (36.8 C) (Oral)   Resp 18   Ht 5\' 6"  (1.676 m)   Wt 113.4 kg   SpO2 100%   BMI 40.35 kg/m   Physical Exam Vitals signs and nursing note reviewed.  Constitutional:      General: She is not in acute distress.    Appearance: She is well-developed. She is not diaphoretic.  HENT:     Head: Normocephalic and atraumatic.     Mouth/Throat:     Pharynx: No oropharyngeal exudate.  Eyes:     General: No scleral icterus.       Right eye: No discharge.        Left eye: No discharge.     Conjunctiva/sclera: Conjunctivae normal.     Pupils: Pupils are equal, round, and reactive to light.  Neck:     Musculoskeletal: Normal range of motion and neck supple.      Thyroid: No thyromegaly.  Cardiovascular:     Rate and Rhythm: Normal rate and regular rhythm.     Heart sounds: Normal heart sounds.  No murmur. No friction rub. No gallop.   Pulmonary:     Effort: Pulmonary effort is normal. No respiratory distress.     Breath sounds: Normal breath sounds. No stridor. No wheezing or rales.  Abdominal:     General: Bowel sounds are normal. There is no distension.     Palpations: Abdomen is soft.     Tenderness: There is no abdominal tenderness. There is no guarding or rebound.  Musculoskeletal:     Comments: Mild edema noted to the right knee, no erythema or warmth, some tenderness on the medial joint line, patella is mildly hypermobile, however stays in tract with passive range of motion; no significant pain with passive range of motion; no other bony tenderness  Lymphadenopathy:     Cervical: No cervical adenopathy.  Skin:    General: Skin is warm and dry.     Coloration: Skin is not pale.     Findings: No rash.  Neurological:     Mental Status: She is alert.     Coordination: Coordination normal.      ED Treatments / Results  Labs (all labs ordered are listed, but only abnormal results are displayed) Labs Reviewed - No data to display  EKG None  Radiology Dg Knee Complete 4 Views Right  Result Date: 08/16/2018 CLINICAL DATA:  Right knee pain. EXAM: RIGHT KNEE - COMPLETE 4+ VIEW COMPARISON:  None. FINDINGS: No evidence of fracture, dislocation, or joint effusion. No evidence of arthropathy or other focal bone abnormality. Soft tissues are unremarkable. IMPRESSION: Negative. Electronically Signed   By: Charlett Nose M.D.   On: 08/16/2018 09:50    Procedures Procedures (including critical care time)  Medications Ordered in ED Medications - No data to display   Initial Impression / Assessment and Plan / ED Course  I have reviewed the triage vital signs and the nursing notes.  Pertinent labs & imaging results that were available during  my care of the patient were reviewed by me and considered in my medical decision making (see chart for details).     Patient with probable patellar dislocation and spontaneous reduction.  X-ray of the right knee is negative.  Patella appears to be in place today.  Patient provided a knee sleeve, but advised she could find a more specific brace at a medical supply store.  Ibuprofen and Tylenol discussed.  Ice, rest, elevation also discussed.  Patient will be referred to orthopedics for further evaluation.  Return precautions discussed.  Patient understands and agrees with plan.  Patient vital stable throughout ED course and discharged in satisfactory condition.  Final Clinical Impressions(s) / ED Diagnoses   Final diagnoses:  Acute pain of right knee    ED Discharge Orders         Ordered    ibuprofen (ADVIL,MOTRIN) 800 MG tablet  3 times daily     08/16/18 1019    acetaminophen (TYLENOL) 500 MG tablet  Every 6 hours PRN     08/16/18 [address], [place], PA-C 08/16/18 1302    Charlynne Pander, MD 08/16/18 1459

## 2018-08-16 NOTE — Discharge Instructions (Signed)
Use ice 3-4 times daily alternating 20 minutes on, 20 minutes off.  Alternate ibuprofen and Tylenol as prescribed, as needed for pain.  Wear knee brace for support at all times, especially when you are moving your knee.  You may also find a more specific patellar stabilization brace at a medical supply store.  We do not have those available here.  Elevate your leg whenever you are not walking on it.  It is important that you see an orthopedic doctor.  You can call the [facility] for assistance with obtaining an orange card, which can help you see a specialist without insurance.  Please return the emergency department if you develop any new or worsening symptoms.

## 2018-08-16 NOTE — ED Triage Notes (Signed)
Reports dislocating right knee 3x in the past 24 hours.  States she was able to reduce these at home without issues.

## 2019-01-08 ENCOUNTER — Emergency Department (HOSPITAL_BASED_OUTPATIENT_CLINIC_OR_DEPARTMENT_OTHER)
Admission: EM | Admit: 2019-01-08 | Discharge: 2019-01-08 | Disposition: A | Discharge status: Home / Self Care | Payer: Medicaid Other | Attending: Emergency Medicine | Admitting: Emergency Medicine

## 2019-01-08 ENCOUNTER — Encounter (HOSPITAL_BASED_OUTPATIENT_CLINIC_OR_DEPARTMENT_OTHER): Payer: Self-pay | Admitting: Emergency Medicine

## 2019-01-08 ENCOUNTER — Other Ambulatory Visit: Payer: Self-pay

## 2019-01-08 DIAGNOSIS — J020 Streptococcal pharyngitis: Secondary | ICD-10-CM | POA: Insufficient documentation

## 2019-01-08 DIAGNOSIS — Z91040 Latex allergy status: Secondary | ICD-10-CM | POA: Insufficient documentation

## 2019-01-08 DIAGNOSIS — F17210 Nicotine dependence, cigarettes, uncomplicated: Secondary | ICD-10-CM | POA: Insufficient documentation

## 2019-01-08 DIAGNOSIS — Z79899 Other long term (current) drug therapy: Secondary | ICD-10-CM | POA: Insufficient documentation

## 2019-01-08 MED ORDER — AZITHROMYCIN 250 MG PO TABS: 250.0000 mg | ORAL_TABLET | Freq: Every day | ORAL | 0 refills | Status: DC

## 2019-01-08 MED ORDER — KETOROLAC TROMETHAMINE 60 MG/2ML IM SOLN
60.0000 mg | Freq: Once | INTRAMUSCULAR | Status: AC
  Administered 2019-01-08: 60 mg via INTRAMUSCULAR
  Filled 2019-01-08: qty 2

## 2019-01-08 NOTE — Discharge Instructions (Addendum)
Take the antibiotics as prescribed, follow-up with your primary care doctor if not improving

## 2019-01-08 NOTE — ED Triage Notes (Signed)
Sore throat x 5 days. Son recently dx with strep throat.

## 2019-01-08 NOTE — ED Provider Notes (Signed)
[facility] EMERGENCY DEPARTMENT Provider Note   CSN: [id number] Arrival date & time: 01/08/19  1153    History   Chief Complaint Chief Complaint  Patient presents with  . Sore Throat    HPI Cynthia Odonnell is a 33 y.o. female.     HPI Patient presents emergency room for evaluation of a sore throat.  Patient states it started several days ago.  It has been gradually getting worse.  It hurts for her to swallow.  She is noticed that her glands are swollen.  She does have a headache as well.  She denies any fevers.  She denies any fevers.  No chest pain or shortness of breath.  No earache.  Patient took her son to be seen yesterday for sore throat.  He tested positive for strep throat. Past Medical History:  Diagnosis Date  . Abnormal Pap smear of cervix   . Anxiety   . Arthritis   . Bronchitis    Hx - uses albuterol inhaler prn  . Cocaine abuse, in remission (HCC)   . Depression   . GERD (gastroesophageal reflux disease)    zantac prn  . Headache(784.0)    otc prn  . Optic neuritis    left eye  . Smoker   . Termination of pregnancy (fetus)    x 2    Patient Active Problem List   Diagnosis Date Noted  . Substance induced mood disorder (HCC) 03/06/2018  . Bipolar 1 disorder, mixed, severe (HCC) 03/05/2018  . Bipolar affective disorder (HCC)   . Bradycardia   . Cocaine abuse (HCC)   . Hypotension   . Suicide attempt (HCC)   . H/O cesarean section 11/04/2014  . [redacted] weeks gestation of pregnancy   . Maternal morbid obesity in third trimester, antepartum (HCC)   . Excessive weight gain in pregnancy in second trimester   . [redacted] weeks gestation of pregnancy   . Smoking   . Poor fetal growth affecting management of mother in third trimester, antepartum   . [redacted] weeks gestation of pregnancy   . Maternal morbid obesity in second trimester, antepartum (HCC)   . [redacted] weeks gestation of pregnancy   . Evaluate anatomy not seen on prior sonogram   . Tobacco smoking  affecting pregnancy in second trimester, antepartum   . Obesity affecting pregnancy in second trimester   . Uterine scar from previous cesarean delivery, antepartum complication   . Encounter for fetal anatomic survey   . [redacted] weeks gestation of pregnancy     Past Surgical History:  Procedure Laterality Date  . CESAREAN SECTION  07/31/2008   [facility]  . CESAREAN SECTION N/A 11/04/2014   Procedure: CESAREAN SECTION;  Surgeon: Tracey Harrieshomas Henley, MD;  Location: [facility] ORS;  Service: Obstetrics;  Laterality: N/A;  . COLPOSCOPY    . WISDOM TOOTH EXTRACTION       OB History    Gravida  4   Para  2   Term  2   Preterm      AB  2   Living  2     SAB      TAB  2   Ectopic      Multiple  0   Live Births  2            Home Medications    Prior to Admission medications   Medication Sig Start Date End Date Taking? Authorizing Provider  acetaminophen (TYLENOL) 500 MG tablet Take 1 tablet (500  mg total) by mouth every 6 (six) hours as needed. 08/16/18   Law, Bea Graff, PA-C  azithromycin (ZITHROMAX) 250 MG tablet Take 1 tablet (250 mg total) by mouth daily. Take first 2 tablets together, then 1 every day until finished. 01/08/19   Dorie Rank, MD  etonogestrel (NEXPLANON) 68 MG IMPL implant Inject 66 mg into the skin.    [provider]  gabapentin (NEURONTIN) 300 MG capsule Take 2 capsules (600 mg total) by mouth 3 (three) times daily. 03/12/18   Starkes-Perry, Gayland Curry, FNP  hydrOXYzine (ATARAX/VISTARIL) 50 MG tablet Take 1 tablet (50 mg total) by mouth every 6 (six) hours as needed for anxiety. 03/12/18   Starkes-Perry, Gayland Curry, FNP  ibuprofen (ADVIL,MOTRIN) 800 MG tablet Take 1 tablet (800 mg total) by mouth 3 (three) times daily. 08/16/18   Law, Bea Graff, PA-C  nicotine polacrilex (NICORETTE) 2 MG gum Take 1 each (2 mg total) by mouth as needed for smoking cessation. 03/12/18   Starkes-Perry, Gayland Curry, FNP  risperiDONE (RISPERDAL) 1 MG tablet Take 1 mg by mouth at bedtime.    [provider]  traZODone (DESYREL) 50 MG tablet Take 1 tablet (50 mg total) by mouth at bedtime as needed for sleep. 03/12/18   Suella Broad, FNP    Family History Family History  Problem Relation Age of Onset  . Other Neg Hx     Social History Social History   Tobacco Use  . Smoking status: Current Every Day Smoker    Packs/day: 1.00    Years: 16.00    Pack years: 16.00    Types: Cigarettes  . Smokeless tobacco: Never Used  Substance Use Topics  . Alcohol use: No  . Drug use: Yes    Frequency: 3.0 times per week    Types: Cocaine    Comment: states "usually 2 grams at at time"     Allergies   Amoxicillin; Codeine; Latex; Penicillins; Prednisone; Augmentin [amoxicillin-pot clavulanate]; Lamictal [lamotrigine]; and Tramadol   Review of Systems Review of Systems  All other systems reviewed and are negative.    Physical Exam Updated Vital Signs BP (!) 125/91 (BP Location: Right Arm)   Pulse 73   Temp 98 F (36.7 C) (Oral)   Resp 18   Ht 1.676 m (5\' 6" )   Wt 108.9 kg   LMP 01/05/2019   SpO2 99%   BMI 38.74 kg/m   Physical Exam Vitals signs and nursing note reviewed.  Constitutional:      General: She is not in acute distress.    Appearance: She is well-developed.  HENT:     Head: Normocephalic and atraumatic.     Right Ear: External ear normal.     Left Ear: External ear normal.     Mouth/Throat:     Pharynx: Uvula midline. Pharyngeal swelling, oropharyngeal exudate and posterior oropharyngeal erythema present. No uvula swelling.     Tonsils: No tonsillar abscesses. 2+ on the right. 2+ on the left.  Eyes:     General: No scleral icterus.       Right eye: No discharge.        Left eye: No discharge.     Conjunctiva/sclera: Conjunctivae normal.  Neck:     Musculoskeletal: Neck supple.     Trachea: No tracheal deviation.  Cardiovascular:     Rate and Rhythm: Normal rate.  Pulmonary:     Effort: Pulmonary effort is normal. No respiratory  distress.     Breath  sounds: No stridor.  Abdominal:     General: There is no distension.  Musculoskeletal:        General: No swelling or deformity.  Lymphadenopathy:     Cervical: Cervical adenopathy present.  Skin:    General: Skin is warm and dry.     Findings: No rash.  Neurological:     Mental Status: She is alert.     Cranial Nerves: Cranial nerve deficit: no gross deficits.      ED Treatments / Results  Labs (all labs ordered are listed, but only abnormal results are displayed) Labs Reviewed - No data to display  EKG None  Radiology No results found.  Procedures Procedures (including critical care time)  Medications Ordered in ED Medications  ketorolac (TORADOL) injection 60 mg (60 mg Intramuscular Given 01/08/19 1304)     Initial Impression / Assessment and Plan / ED Course  I have reviewed the triage vital signs and the nursing notes.  Pertinent labs & imaging results that were available during my care of the patient were reviewed by me and considered in my medical decision making (see chart for details).   Patient's exam is consistent with strep throat.  Her son a positive test yesterday.  I will treat her empirically for streptococcal pharyngitis with azithromycin.  Patient has a penicillin allergy.  Final Clinical Impressions(s) / ED Diagnoses   Final diagnoses:  Pharyngitis due to Streptococcus species    ED Discharge Orders         Ordered    azithromycin (ZITHROMAX) 250 MG tablet  Daily,   Status:  Discontinued     01/08/19 1259    azithromycin (ZITHROMAX) 250 MG tablet  Daily     01/08/19 1308           Linwood DibblesKnapp, Analeah Brame, MD 01/08/19 1311

## 2019-01-10 MED FILL — AZITHROMYCIN 250 MG TABLET: 250 | 5 days supply | Qty: 6 | Fill #0

## 2019-03-15 ENCOUNTER — Encounter (HOSPITAL_COMMUNITY): Payer: Self-pay | Admitting: Family Medicine

## 2019-03-15 ENCOUNTER — Other Ambulatory Visit: Payer: Self-pay

## 2019-03-15 ENCOUNTER — Emergency Department (HOSPITAL_COMMUNITY)
Admission: EM | Admit: 2019-03-15 | Discharge: 2019-03-15 | Discharge status: Against Medical Advice | Payer: Medicaid Other | Attending: Emergency Medicine | Admitting: Emergency Medicine

## 2019-03-15 DIAGNOSIS — Z5321 Procedure and treatment not carried out due to patient leaving prior to being seen by health care provider: Secondary | ICD-10-CM | POA: Insufficient documentation

## 2019-03-15 NOTE — ED Triage Notes (Signed)
Patient states she started taking Vraylar 1.5mg  since last Thursday. After taking the medication, she reports sleeping for about 13 hours. When waking up she noticed her muscles twitching. She spoke with Beverly Sessions, who advised her to take Benztropine. She reports her symptoms have not improved.

## 2019-03-15 NOTE — ED Notes (Signed)
Registration Advertising copywriter that patient is leaving.

## 2019-04-15 ENCOUNTER — Inpatient Hospital Stay (HOSPITAL_COMMUNITY)
Admission: RE | Admit: 2019-04-15 | Discharge: 2019-04-19 | DRG: 897 | Disposition: A | Discharge status: Home / Self Care | Payer: Federal, State, Local not specified - Other | Attending: Psychiatry | Admitting: Psychiatry

## 2019-04-15 ENCOUNTER — Other Ambulatory Visit: Payer: Self-pay

## 2019-04-15 ENCOUNTER — Encounter (HOSPITAL_COMMUNITY): Payer: Self-pay | Admitting: *Deleted

## 2019-04-15 DIAGNOSIS — F1990 Other psychoactive substance use, unspecified, uncomplicated: Secondary | ICD-10-CM | POA: Diagnosis present

## 2019-04-15 DIAGNOSIS — K219 Gastro-esophageal reflux disease without esophagitis: Secondary | ICD-10-CM | POA: Diagnosis present

## 2019-04-15 DIAGNOSIS — G4700 Insomnia, unspecified: Secondary | ICD-10-CM | POA: Diagnosis present

## 2019-04-15 DIAGNOSIS — F251 Schizoaffective disorder, depressive type: Secondary | ICD-10-CM | POA: Diagnosis present

## 2019-04-15 DIAGNOSIS — F17210 Nicotine dependence, cigarettes, uncomplicated: Secondary | ICD-10-CM | POA: Diagnosis present

## 2019-04-15 DIAGNOSIS — Z20828 Contact with and (suspected) exposure to other viral communicable diseases: Secondary | ICD-10-CM | POA: Diagnosis present

## 2019-04-15 DIAGNOSIS — F1423 Cocaine dependence with withdrawal: Secondary | ICD-10-CM | POA: Diagnosis present

## 2019-04-15 DIAGNOSIS — F3163 Bipolar disorder, current episode mixed, severe, without psychotic features: Secondary | ICD-10-CM | POA: Diagnosis present

## 2019-04-15 DIAGNOSIS — F1424 Cocaine dependence with cocaine-induced mood disorder: Principal | ICD-10-CM | POA: Diagnosis present

## 2019-04-15 DIAGNOSIS — F1994 Other psychoactive substance use, unspecified with psychoactive substance-induced mood disorder: Secondary | ICD-10-CM | POA: Diagnosis present

## 2019-04-15 DIAGNOSIS — J449 Chronic obstructive pulmonary disease, unspecified: Secondary | ICD-10-CM | POA: Diagnosis present

## 2019-04-15 DIAGNOSIS — R45851 Suicidal ideations: Secondary | ICD-10-CM | POA: Diagnosis present

## 2019-04-15 DIAGNOSIS — Z915 Personal history of self-harm: Secondary | ICD-10-CM

## 2019-04-15 LAB — SARS CORONAVIRUS 2 BY RT PCR (HOSPITAL ORDER, PERFORMED IN ~~LOC~~ HOSPITAL LAB): SARS Coronavirus 2: NEGATIVE

## 2019-04-15 MED ORDER — HYDROXYZINE HCL 25 MG PO TABS
25.0000 mg | ORAL_TABLET | Freq: Three times a day (TID) | ORAL | Status: DC | PRN
  Administered 2019-04-15: 20:00:00 25 mg via ORAL
  Filled 2019-04-15: qty 1

## 2019-04-15 MED ORDER — ALUM & MAG HYDROXIDE-SIMETH 200-200-20 MG/5ML PO SUSP: 30.0000 mL | ORAL | Status: DC | PRN

## 2019-04-15 MED ORDER — FLUCONAZOLE 150 MG PO TABS
150.0000 mg | ORAL_TABLET | Freq: Once | ORAL | Status: DC
  Filled 2019-04-15: qty 1

## 2019-04-15 MED ORDER — MAGNESIUM HYDROXIDE 400 MG/5ML PO SUSP: 30.0000 mL | Freq: Every day | ORAL | Status: DC | PRN

## 2019-04-15 MED ORDER — TRAZODONE HCL 50 MG PO TABS
50.0000 mg | ORAL_TABLET | Freq: Every day | ORAL | Status: DC
  Administered 2019-04-15: 50 mg via ORAL
  Filled 2019-04-15 (×2): qty 1

## 2019-04-15 MED ORDER — FLUCONAZOLE 100 MG PO TABS
150.0000 mg | ORAL_TABLET | Freq: Once | ORAL | Status: AC
  Administered 2019-04-15: 150 mg via ORAL
  Filled 2019-04-15: qty 2
  Filled 2019-04-15: qty 1.5

## 2019-04-15 MED ORDER — PRAZOSIN HCL 1 MG PO CAPS
1.0000 mg | ORAL_CAPSULE | Freq: Once | ORAL | Status: AC
  Administered 2019-04-15: 21:00:00 1 mg via ORAL
  Filled 2019-04-15: qty 1

## 2019-04-15 MED ORDER — ACETAMINOPHEN 325 MG PO TABS
650.0000 mg | ORAL_TABLET | Freq: Four times a day (QID) | ORAL | Status: DC | PRN
  Administered 2019-04-18: 14:00:00 650 mg via ORAL
  Filled 2019-04-15: qty 2

## 2019-04-15 MED ORDER — NICOTINE 21 MG/24HR TD PT24
21.0000 mg | MEDICATED_PATCH | Freq: Every day | TRANSDERMAL | Status: DC
  Administered 2019-04-15 – 2019-04-18 (×4): 21 mg via TRANSDERMAL
  Filled 2019-04-15 (×7): qty 1

## 2019-04-15 MED ORDER — GABAPENTIN 400 MG PO CAPS
400.0000 mg | ORAL_CAPSULE | Freq: Three times a day (TID) | ORAL | Status: DC
  Administered 2019-04-16: 09:00:00 400 mg via ORAL
  Filled 2019-04-15 (×4): qty 1
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
## 2019-04-15 NOTE — Progress Notes (Signed)
Patient ID: Cynthia Odonnell, female   DOB: Dec 04, 1985, 33 y.o.   MRN: [id number] Pt A&O x 4, no distress noted, calm & cooperative at present.  Resting at present.  Monitoring for safety.

## 2019-04-15 NOTE — Progress Notes (Signed)
Pt admitted to OBS voluntary reporting that she wants help with substance abuse. Pt reports that she uses meth, cocaine and alcohol. Pt used cocaine prior to admission. Pt lives with her husband, 2 children, mother and grandfather. Pt says that she sees faces in the wood floor. Pt reports a hx of Bipolar, and Optical Neuritis in her lt eye. She says that she takes no psychiatric medication. Covid test completed and waiting on results.
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
## 2019-04-15 NOTE — BH Assessment (Signed)
[facility] Assessment Progress Note  Case was staffed with Parks NP who recommended patient be observed and monitored.    
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
## 2019-04-16 DIAGNOSIS — F1990 Other psychoactive substance use, unspecified, uncomplicated: Secondary | ICD-10-CM | POA: Diagnosis present

## 2019-04-16 DIAGNOSIS — F1994 Other psychoactive substance use, unspecified with psychoactive substance-induced mood disorder: Secondary | ICD-10-CM | POA: Diagnosis not present

## 2019-04-16 LAB — LIPID PANEL
Cholesterol: 175 mg/dL (ref 0–200)
HDL: 38 mg/dL — ABNORMAL LOW (ref >40)
LDL Cholesterol: 112 mg/dL — ABNORMAL HIGH (ref 0–99)
Total CHOL/HDL Ratio: 4.6 RATIO
Triglycerides: 126 mg/dL (ref <150)
VLDL: 25 mg/dL (ref 0–40)

## 2019-04-16 LAB — CBC
HCT: 44 % (ref 36.0–46.0)
Hemoglobin: 14.9 g/dL (ref 12.0–15.0)
MCH: 29.9 pg (ref 26.0–34.0)
MCHC: 33.9 g/dL (ref 30.0–36.0)
MCV: 88.4 fL (ref 80.0–100.0)
Platelets: 212 10*3/uL (ref 150–400)
RBC: 4.98 MIL/uL (ref 3.87–5.11)
RDW: 13 % (ref 11.5–15.5)
WBC: 6.3 10*3/uL (ref 4.0–10.5)
nRBC: 0 % (ref 0.0–0.2)

## 2019-04-16 LAB — COMPREHENSIVE METABOLIC PANEL
ALT: 14 U/L (ref 0–44)
AST: 14 U/L — ABNORMAL LOW (ref 15–41)
Albumin: 3.3 g/dL — ABNORMAL LOW (ref 3.5–5.0)
Alkaline Phosphatase: 62 U/L (ref 38–126)
Anion gap: 9 (ref 5–15)
BUN: 8 mg/dL (ref 6–20)
CO2: 21 mmol/L — ABNORMAL LOW (ref 22–32)
Calcium: 8.4 mg/dL — ABNORMAL LOW (ref 8.9–10.3)
Chloride: 109 mmol/L (ref 98–111)
Creatinine, Ser: 0.88 mg/dL (ref 0.44–1.00)
GFR calc Af Amer: >60 mL/min (ref >60)
GFR calc non Af Amer: >60 mL/min (ref >60)
Glucose, Bld: 86 mg/dL (ref 70–99)
Potassium: 3.6 mmol/L (ref 3.5–5.1)
Sodium: 139 mmol/L (ref 135–145)
Total Bilirubin: 0.5 mg/dL (ref 0.3–1.2)
Total Protein: 6 g/dL — ABNORMAL LOW (ref 6.5–8.1)

## 2019-04-16 LAB — TSH: TSH: 2.507 u[IU]/mL (ref 0.350–4.500)

## 2019-04-16 MED ORDER — TRAZODONE HCL 50 MG PO TABS: 50.0000 mg | ORAL_TABLET | Freq: Every evening | ORAL | Status: DC | PRN

## 2019-04-16 MED ORDER — CARIPRAZINE HCL 1.5 MG PO CAPS
1.5000 mg | ORAL_CAPSULE | Freq: Every day | ORAL | Status: DC
  Filled 2019-04-16 (×4): qty 1

## 2019-04-16 MED ORDER — ZIPRASIDONE MESYLATE 20 MG IM SOLR: 10.0000 mg | INTRAMUSCULAR | Status: DC | PRN

## 2019-04-16 MED ORDER — ZIPRASIDONE MESYLATE 20 MG IM SOLR: 20.0000 mg | INTRAMUSCULAR | Status: DC | PRN

## 2019-04-16 MED ORDER — OLANZAPINE 5 MG PO TBDP: 5.0000 mg | ORAL_TABLET | Freq: Three times a day (TID) | ORAL | Status: DC | PRN

## 2019-04-16 MED ORDER — LORAZEPAM 1 MG PO TABS: 1.0000 mg | ORAL_TABLET | ORAL | Status: DC | PRN

## 2019-04-16 MED ORDER — TRAZODONE HCL 50 MG PO TABS
ORAL_TABLET | ORAL | Status: AC
  Administered 2019-04-16: 22:00:00
  Filled 2019-04-16: qty 1

## 2019-04-16 MED ORDER — MAGNESIUM HYDROXIDE 400 MG/5ML PO SUSP: 30.0000 mL | Freq: Every day | ORAL | Status: DC | PRN

## 2019-04-16 MED ORDER — ACETAMINOPHEN 325 MG PO TABS: 650.0000 mg | ORAL_TABLET | Freq: Four times a day (QID) | ORAL | Status: DC | PRN

## 2019-04-16 MED ORDER — CARIPRAZINE HCL 3 MG PO CAPS
3.0000 mg | ORAL_CAPSULE | Freq: Every day | ORAL | Status: DC
  Administered 2019-04-17 – 2019-04-19 (×3): 3 mg via ORAL
  Filled 2019-04-16: qty 7
  Filled 2019-04-16 (×4): qty 1

## 2019-04-16 MED ORDER — ALUM & MAG HYDROXIDE-SIMETH 200-200-20 MG/5ML PO SUSP: 30.0000 mL | ORAL | Status: DC | PRN

## 2019-04-16 MED ORDER — LORAZEPAM 0.5 MG PO TABS
0.5000 mg | ORAL_TABLET | Freq: Four times a day (QID) | ORAL | Status: DC | PRN
  Administered 2019-04-17 – 2019-04-18 (×6): 0.5 mg via ORAL
  Filled 2019-04-16 (×7): qty 1

## 2019-04-16 MED ORDER — GABAPENTIN 300 MG PO CAPS
300.0000 mg | ORAL_CAPSULE | Freq: Three times a day (TID) | ORAL | Status: DC
  Administered 2019-04-16 – 2019-04-19 (×8): 300 mg via ORAL
  Filled 2019-04-16 (×6): qty 1
  Filled 2019-04-16: qty 21
  Filled 2019-04-16 (×3): qty 1
  Filled 2019-04-16: qty 21
  Filled 2019-04-16 (×4): qty 1
  Filled 2019-04-16: qty 21
  Filled 2019-04-16 (×2): qty 1

## 2019-04-16 MED ORDER — LORAZEPAM 1 MG PO TABS: 1.0000 mg | ORAL_TABLET | Freq: Four times a day (QID) | ORAL | Status: DC | PRN

## 2019-04-16 NOTE — Progress Notes (Signed)
D: Patient is a transfer from OBS to the adult unit for substance abuse.  Patient has been using cocaine.  She also reports depressive symptoms.  Patient denies any thoughts of self harm.  Patient has been pleasant and she interacts well with her peers.    A: Continue to monitor medication management and MD orders.  Safety checks completed every 15 minutes per protocol.  Offer support and encouragement as needed.  R: Patient is receptive to staff; her behavior is appropriate.

## 2019-04-16 NOTE — Progress Notes (Signed)
Pt A & O X4. Denies HI, AVH and pain when assessed, stated "not right now". Continues to endorse passive SI without a specific plan at this time. Observed in bed majority of this shift. Compliant with medications when offered. Emotional support and availability provided to pt throughout this shift. Scheduled medications administered as ordered with verbal education and effects monitored. EKG done, placed in chart. Report called to [facility] on [address] hall for inpatient admission. Q 15 minutes safety checks maintained without self harm gestures or outburst. Encouraged pt to voice concerns.  Pt remains safe on unit. Tolerates all PO intake well. Denies concerns at present.  Pt. Transferred to 300 hall per order.
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
## 2019-04-16 NOTE — Progress Notes (Signed)
Patient ID: Cynthia Odonnell, female   DOB: 18-Jul-1986, 33 y.o.   MRN: [id number]  Pt spent the night on the OBS unit without incident. She has a history of substance use disorder, cocaine, and  Bipolar Disorder. She admits to using 1-1.5 grams of cocaine daily. She stated she knows she will die if she does not stop using and sometimes thinks about dying. She is recommended for an inpatient admission. Urinalysis and UDS were ordered but not collected. SARS negative. TSH 2.57. HDL 38, LDL 112, Total cholesterol 175, pt is obese. Albumin, protein and calcium are slightly decreased but may be due to drug use. Pt accepted to [facility] [address].   Ethelene Hal, PMHNP-BC 04/16/2019      1152

## 2019-04-17 DIAGNOSIS — F1994 Other psychoactive substance use, unspecified with psychoactive substance-induced mood disorder: Secondary | ICD-10-CM | POA: Diagnosis not present

## 2019-04-17 LAB — URINALYSIS, ROUTINE W REFLEX MICROSCOPIC
Bilirubin Urine: NEGATIVE
Glucose, UA: NEGATIVE mg/dL
Hgb urine dipstick: NEGATIVE
Ketones, ur: NEGATIVE mg/dL
Nitrite: NEGATIVE
Protein, ur: NEGATIVE mg/dL
Specific Gravity, Urine: 1.016 (ref 1.005–1.030)
pH: 5 (ref 5.0–8.0)

## 2019-04-17 LAB — HCG, SERUM, QUALITATIVE: Preg, Serum: NEGATIVE

## 2019-04-17 MED ORDER — TRAZODONE HCL 100 MG PO TABS
100.0000 mg | ORAL_TABLET | Freq: Every evening | ORAL | Status: DC | PRN
  Administered 2019-04-18: 21:00:00 100 mg via ORAL
  Filled 2019-04-17: qty 1
  Filled 2019-04-17: qty 7

## 2019-04-17 MED ORDER — BENZTROPINE MESYLATE 0.5 MG PO TABS
0.5000 mg | ORAL_TABLET | Freq: Two times a day (BID) | ORAL | Status: DC | PRN
  Administered 2019-04-17 – 2019-04-18 (×2): 0.5 mg via ORAL
  Filled 2019-04-17: qty 14
  Filled 2019-04-17 (×2): qty 1

## 2019-04-17 MED ORDER — BENZTROPINE MESYLATE 0.5 MG PO TABS: 0.5000 mg | ORAL_TABLET | Freq: Two times a day (BID) | ORAL | Status: DC | PRN

## 2019-04-17 MED ORDER — HYDROXYZINE HCL 25 MG PO TABS
25.0000 mg | ORAL_TABLET | Freq: Four times a day (QID) | ORAL | Status: DC | PRN
  Administered 2019-04-18: 17:00:00 25 mg via ORAL
  Filled 2019-04-17: qty 1

## 2019-04-17 MED ORDER — TRAZODONE HCL 50 MG PO TABS
50.0000 mg | ORAL_TABLET | Freq: Every evening | ORAL | Status: DC | PRN
  Administered 2019-04-17: 50 mg via ORAL
  Filled 2019-04-17: qty 1

## 2019-04-17 NOTE — [facility] Group Notes (Signed)
LCSW Group Therapy Note  04/17/2019  10:00-11:00AM  Group Therapy/Topic:   Adding Supports Including Yourself  Participation Level:  Did Not Attend   Description of Group: Patients in this group were introduced to the concept that additional supports including self-support are an essential part of recovery.  Patients listed their current healthy and unhealthy supports, and were asked to consider adding additional supports to help them achieve their goals at discharge.  A song entitled "My Own Hero" was played and a group discussion ensued in which patients stated they could relate to the song and it inspired them to realize they have to be willing to help themselves in order to succeed, because other people cannot achieve sobriety or stability for them.  A song was played called "I Am Enough" which led to a discussion about being willing to believe in their own worth.  Therapeutic Goals: 1)  demonstrate the importance of being a key part of one's own support system 2)  discuss various available supports 3)  encourage patient to use music as part of their self-support and focus on goals 4)  elicit ideas from patients about supports that need to be added   Summary of Patient Progress:  The patient arrived for the last 2-3 minutes of group only.  Therapeutic Modalities:   Motivational Interviewing Activity  Cynthia Odonnell

## 2019-04-17 NOTE — Progress Notes (Signed)
D. Pt presents with an anxious affect/ friendly during interactions- is calm and cooperative- per pt's self inventory, pt rated her depression, hopelessness and anxiety a 02/06/09, respectively.Pt currently denies SI/HI and AVH  A. Labs and vitals monitored. Pt compliant with medications. Pt supported emotionally and encouraged to express concerns and ask questions.   R. Pt remains safe with 15 minute checks. Will continue POC.
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
## 2019-04-17 NOTE — Progress Notes (Signed)
[facility] MD Progress Note  04/17/2019 10:55 AM Cynthia Odonnell  MRN:  [id number] Subjective: Patient reports she is "all right".  States "I feel like I am coming down from my manic".  She does endorse some residual subjective sense of racing thoughts but to a lesser degree than prior to admission.  Today denies suicidal ideations.  Denies medication side effects thus far, but expresses concern that antipsychotics have been associated with "lockjaw" in the past.  Objective: I reviewed chart notes and have met with patient. 33 year old married female.  Presented voluntarily.  Reports long history of cocaine dependence. Describes a long history of mood disorder and a history of intermittent hallucinations.  Has been diagnosed with bipolar disorder in the past.  Reports (and presents with) symptoms suggestive of mixed mood episode.  Symptoms include depression, subjective feeling of racing thoughts, pressured speech.  Currently denies suicidal ideations.  Has been taking Vraylar at 1.5 mg daily over the last few weeks without significant side effects.   Today patient presents alert, attentive, partially improved compared to initial presentation with improved/less pressured speech and a more organized thought process.  She reports she did not have hallucinations yesterday evening or thus far today.  Currently does not appear internally preoccupied. Thus far tolerating medications well.  States she has had acute dystonia particularly in her jaw area during previous antipsychotic medication trials and is concerned about having the side effect on Vraylar.  She has taken Cogentin with good result although this medication has been associated with dry mouth. Describes intermittent cravings for cocaine but a high level of motivation to remain sober at this time. No disruptive or agitated behaviors, pleasant on approach Labs reviewed-pregnancy test negative, TSH 2.507, lipid panel unremarkable other than marginally elevated  LDL, CBC unremarkable.   Principal Problem: Substance induced mood disorder (HCC) Diagnosis: Principal Problem:   Substance induced mood disorder (HCC) Active Problems:   Bipolar 1 disorder, mixed, severe (HCC)   Substance use disorder  Total Time spent with patient: 20 minutes  Past Psychiatric History:   Past Medical History:  Past Medical History:  Diagnosis Date  . Abnormal Pap smear of cervix   . Anxiety   . Arthritis   . Bronchitis    Hx - uses albuterol inhaler prn  . Cocaine abuse, in remission ([place])   . Depression   . GERD (gastroesophageal reflux disease)    zantac prn  . Headache(784.0)    otc prn  . Optic neuritis    left eye  . Smoker   . Termination of pregnancy (fetus)    x 2    Past Surgical History:  Procedure Laterality Date  . CESAREAN SECTION  07/31/2008   [place]  . CESAREAN SECTION N/A 11/04/2014   Procedure: CESAREAN SECTION;  Surgeon: Newton Pigg, MD;  Location: [facility];  Service: Obstetrics;  Laterality: N/A;  . COLPOSCOPY    . WISDOM TOOTH EXTRACTION     Family History:  Family History  Problem Relation Age of Onset  . Other Neg Hx    Family Psychiatric  History:  Social History:  Social History   Substance and Sexual Activity  Alcohol Use Yes   Comment: Beer: Tonight      Social History   Substance and Sexual Activity  Drug Use Yes  . Frequency: 3.0 times per week  . Types: Cocaine, Methamphetamines   Comment: Last used: yesterday     Social History   Socioeconomic History  . Marital status: Married  Spouse name: Not on file  . Number of children: Not on file  . Years of education: Not on file  . Highest education level: Not on file  Occupational History  . Not on file  Social Needs  . Financial resource strain: Not on file  . Food insecurity    Worry: Not on file    Inability: Not on file  . Transportation needs    Medical: Not on file    Non-medical: Not on file  Tobacco Use  . Smoking status: Current Every  Day Smoker    Packs/day: 1.00    Years: 16.00    Pack years: 16.00    Types: Cigarettes  . Smokeless tobacco: Never Used  Substance and Sexual Activity  . Alcohol use: Yes    Comment: Beer: Tonight   . Drug use: Yes    Frequency: 3.0 times per week    Types: Cocaine, Methamphetamines    Comment: Last used: yesterday   . Sexual activity: Yes    Birth control/protection: Implant    Comment: implant "old"  Lifestyle  . Physical activity    Days per week: Not on file    Minutes per session: Not on file  . Stress: Not on file  Relationships  . Social Herbalist on phone: Not on file    Gets together: Not on file    Attends religious service: Not on file    Active member of club or organization: Not on file    Attends meetings of clubs or organizations: Not on file    Relationship status: Not on file  Other Topics Concern  . Not on file  Social History Narrative  . Not on file   Additional Social History:    Pain Medications: See MAR Prescriptions: See MAR Over the Counter: See MAR History of alcohol / drug use?: Yes Longest period of sobriety (when/how long): Unknown Negative Consequences of Use: Personal relationships Withdrawal Symptoms: Irritability, Agitation Name of Substance 1: Cocaine 1 - Age of First Use: 16 1 - Amount (size/oz): 1 gram 1 - Frequency: Daily 1 - Duration: Ongoing 1 - Last Use / Amount: 04/15/19 1 gram  Sleep: Fair  Appetite:  Good  Current Medications: Current Facility-Administered Medications  Medication Dose Route Frequency Provider Last Rate Last Dose  . acetaminophen (TYLENOL) tablet 650 mg  650 mg Oral Q6H PRN Ethelene Hal, NP      . benztropine (COGENTIN) tablet 0.5 mg  0.5 mg Oral BID PRN Cobos, Myer Peer, MD      . cariprazine (VRAYLAR) capsule 3 mg  3 mg Oral Daily Cobos, Myer Peer, MD   3 mg at 04/17/19 0826  . gabapentin (NEURONTIN) capsule 300 mg  300 mg Oral TID Cobos, Myer Peer, MD   300 mg at 04/17/19  0825  . LORazepam (ATIVAN) tablet 0.5 mg  0.5 mg Oral Q6H PRN Cobos, Fernando A, MD      . ziprasidone (GEODON) injection 10 mg  10 mg Intramuscular PRN Cobos, Myer Peer, MD       And  . OLANZapine zydis (ZYPREXA) disintegrating tablet 5 mg  5 mg Oral Q8H PRN Cobos, Myer Peer, MD       And  . LORazepam (ATIVAN) tablet 1 mg  1 mg Oral PRN Cobos, Myer Peer, MD      . nicotine (NICODERM CQ - dosed in mg/24 hours) patch 21 mg  21 mg Transdermal Daily Ethelene Hal, NP  21 mg at 04/17/19 0826  . traZODone (DESYREL) tablet 50 mg  50 mg Oral QHS PRN,MR X 1 Rozetta Nunnery, NP        Lab Results:  Results for orders placed or performed during the hospital encounter of 04/15/19 (from the past 48 hour(s))  SARS Coronavirus 2 [facility] order, Performed in [facility] hospital lab) Nasopharyngeal Nasopharyngeal Swab     Status: None   Collection Time: 04/15/19  6:10 PM   Specimen: Nasopharyngeal Swab  Result Value Ref Range   SARS Coronavirus 2 NEGATIVE NEGATIVE    Comment: (NOTE) If result is NEGATIVE SARS-CoV-2 target nucleic acids are NOT DETECTED. The SARS-CoV-2 RNA is generally detectable in upper and lower  respiratory specimens during the acute phase of infection. The lowest  concentration of SARS-CoV-2 viral copies this assay can detect is 250  copies / mL. A negative result does not preclude SARS-CoV-2 infection  and should not be used as the sole basis for treatment or other  patient management decisions.  A negative result may occur with  improper specimen collection / handling, submission of specimen other  than nasopharyngeal swab, presence of viral mutation(s) within the  areas targeted by this assay, and inadequate number of viral copies  (<250 copies / mL). A negative result must be combined with clinical  observations, patient history, and epidemiological information. If result is POSITIVE SARS-CoV-2 target nucleic acids are DETECTED. The SARS-CoV-2 RNA is generally  detectable in upper and lower  respiratory specimens dur ing the acute phase of infection.  Positive  results are indicative of active infection with SARS-CoV-2.  Clinical  correlation with patient history and other diagnostic information is  necessary to determine patient infection status.  Positive results do  not rule out bacterial infection or co-infection with other viruses. If result is PRESUMPTIVE POSTIVE SARS-CoV-2 nucleic acids MAY BE PRESENT.   A presumptive positive result was obtained on the submitted specimen  and confirmed on repeat testing.  While 2019 novel coronavirus  (SARS-CoV-2) nucleic acids may be present in the submitted sample  additional confirmatory testing may be necessary for epidemiological  and / or clinical management purposes  to differentiate between  SARS-CoV-2 and other Sarbecovirus currently known to infect humans.  If clinically indicated additional testing with an alternate test  methodology [id number]) is advised. The SARS-CoV-2 RNA is generally  detectable in upper and lower respiratory sp ecimens during the acute  phase of infection. The expected result is Negative. Fact Sheet for Patients:  StrictlyIdeas.no Fact Sheet for Healthcare Providers: BankingDealers.co.za This test is not yet approved or cleared by the [place] FDA and has been authorized for detection and/or diagnosis of SARS-CoV-2 by FDA under an Emergency Use Authorization (EUA).  This EUA will remain in effect (meaning this test can be used) for the duration of the COVID-19 declaration under Section 564(b)(1) of the Act, 21 U.S.C. section 360bbb-3(b)(1), unless the authorization is terminated or revoked sooner. Performed at [facility], [place] [address], [place], [place] [address]   Comprehensive metabolic panel     Status: Abnormal   Collection Time: 04/16/19  7:22 AM  Result Value Ref Range   Sodium 139 135  - 145 mmol/L   Potassium 3.6 3.5 - 5.1 mmol/L   Chloride 109 98 - 111 mmol/L   CO2 21 (L) 22 - 32 mmol/L   Glucose, Bld 86 70 - 99 mg/dL   BUN 8 6 - 20 mg/dL   Creatinine, Ser  0.88 0.44 - 1.00 mg/dL   Calcium 8.4 (L) 8.9 - 10.3 mg/dL   Total Protein 6.0 (L) 6.5 - 8.1 g/dL   Albumin 3.3 (L) 3.5 - 5.0 g/dL   AST 14 (L) 15 - 41 U/L   ALT 14 0 - 44 U/L   Alkaline Phosphatase 62 38 - 126 U/L   Total Bilirubin 0.5 0.3 - 1.2 mg/dL   GFR calc non Af Amer >60 >60 mL/min   GFR calc Af Amer >60 >60 mL/min   Anion gap 9 5 - 15    Comment: Performed at [facility], [place] [address], [place], [place] [address]  CBC     Status: None   Collection Time: 04/16/19  7:22 AM  Result Value Ref Range   WBC 6.3 4.0 - 10.5 K/uL   RBC 4.98 3.87 - 5.11 MIL/uL   Hemoglobin 14.9 12.0 - 15.0 g/dL   HCT 44.0 36.0 - 46.0 %   MCV 88.4 80.0 - 100.0 fL   MCH 29.9 26.0 - 34.0 pg   MCHC 33.9 30.0 - 36.0 g/dL   RDW 13.0 11.5 - 15.5 %   Platelets 212 150 - 400 K/uL   nRBC 0.0 0.0 - 0.2 %    Comment: Performed at [facility], [place] [address], [place], [place] [address]  Lipid panel     Status: Abnormal   Collection Time: 04/16/19  7:22 AM  Result Value Ref Range   Cholesterol 175 0 - 200 mg/dL   Triglycerides 126 <150 mg/dL   HDL 38 (L) >40 mg/dL   Total CHOL/HDL Ratio 4.6 RATIO   VLDL 25 0 - 40 mg/dL   LDL Cholesterol 112 (H) 0 - 99 mg/dL    Comment:        Total Cholesterol/HDL:CHD Risk Coronary Heart Disease Risk Table                     Men   Women  1/2 Average Risk   3.4   3.3  Average Risk       5.0   4.4  2 X Average Risk   9.6   7.1  3 X Average Risk  23.4   11.0        Use the calculated Patient Ratio above and the CHD Risk Table to determine the patient's CHD Risk.        ATP III CLASSIFICATION (LDL):  <100     mg/dL   Optimal  100-129  mg/dL   Near or Above                    Optimal  130-159  mg/dL   Borderline  160-189  mg/dL   High  >190      mg/dL   Very High Performed at [place] [address] [place], [place] [address]   TSH     Status: None   Collection Time: 04/16/19  7:22 AM  Result Value Ref Range   TSH 2.507 0.350 - 4.500 uIU/mL    Comment: Performed by a 3rd Generation assay with a functional sensitivity of <=0.01 uIU/mL. Performed at [facility], [place] [address] [place], [place] [address]   hCG, serum, qualitative     Status: None   Collection Time: 04/17/19  6:59 AM  Result Value Ref Range   Preg, Serum NEGATIVE NEGATIVE    Comment: NEGATIVE  THE SENSITIVITY OF THIS METHODOLOGY IS >10 mIU/mL. Performed at [facility], [place] [address] [place], [place] [address]     Blood Alcohol level:  Lab Results  Component Value Date   ETH <10 03/04/2018   [facility]  01/28/2009    <5        LOWEST DETECTABLE LIMIT FOR SERUM ALCOHOL IS 5 mg/dL FOR MEDICAL PURPOSES ONLY    Metabolic Disorder Labs: No results found for: HGBA1C, MPG No results found for: PROLACTIN Lab Results  Component Value Date   CHOL 175 04/16/2019   TRIG 126 04/16/2019   HDL 38 (L) 04/16/2019   CHOLHDL 4.6 04/16/2019   VLDL 25 04/16/2019   LDLCALC 112 (H) 04/16/2019    Physical Findings: AIMS: Facial and Oral Movements Muscles of Facial Expression: None, normal Lips and Perioral Area: None, normal Jaw: None, normal Tongue: None, normal,Extremity Movements Upper (arms, wrists, hands, fingers): None, normal Lower (legs, knees, ankles, toes): None, normal, Trunk Movements Neck, shoulders, hips: None, normal, Overall Severity Severity of abnormal movements (highest score from questions above): None, normal Incapacitation due to abnormal movements: None, normal Patient's awareness of abnormal movements (rate only patient's report): No Awareness, Dental Status Current problems with teeth and/or dentures?: No Does patient usually wear dentures?: No  CIWA:  CIWA-Ar Total:  2 COWS:  COWS Total Score: 2  Musculoskeletal: Strength & Muscle Tone: within normal limits Gait & Station: normal Patient leans: Backward  Psychiatric Specialty Exam: Physical Exam  ROS no chest pain, no shortness of breath, no nausea, no vomiting  Blood pressure (!) 108/96, pulse 92, temperature 98 F (36.7 C), temperature source Oral, resp. rate 20, SpO2 100 %, unknown if currently breastfeeding.There is no height or weight on file to calculate BMI.  General Appearance: Fairly Groomed  Eye Contact:  Good  Speech:  Normal Rate-speech not pressured at this time  Volume:  Normal  Mood:  Some improvement, reports she feels her mood is improved compared to admission  Affect:  Less expansive, reactive  Thought Process:  Linear and Descriptions of Associations: Intact  Orientation:  Other:  Fully alert and attentive  Thought Content:  Currently denies hallucinations and does not appear internally preoccupied, no delusions are expressed  Suicidal Thoughts:  No denies suicidal or self-injurious ideations, denies homicidal or violent ideations, contracts for safety on unit  Homicidal Thoughts:  No  Memory:  Recent and remote grossly intact  Judgement:  Fair/improving  Insight:  Fair  Psychomotor Activity:  No current psychomotor agitation or restlessness noted  Concentration:  Concentration: Fair and Attention Span: Fair  Recall:  Good  Fund of Knowledge:  Good  Language:  Good  Akathisia:  Negative  Handed:  Right  AIMS (if indicated):     Assets:  Desire for Improvement Resilience  ADL's:  Intact  Cognition:  WNL  Sleep:  Number of Hours: 6.75    Assessment: 33 year old married female.  Presented voluntarily.  Reports long history of cocaine dependence. Describes a long history of mood disorder and a history of intermittent hallucinations.  Has been diagnosed with bipolar disorder in the past.  Reports (and presents with) symptoms suggestive of mixed mood episode.  Symptoms  include depression, subjective feeling of racing thoughts, pressured speech.  Currently denies suicidal ideations.  Has been taking Vraylar at 1.5 mg daily over the last few weeks without significant side effects.   Today patient presents with partial improvement.  Affect is less expansive, speech is not pressured at  this time, presents with less rumination/preoccupations, denies hallucinations at this time and does not appear internally preoccupied.  Thus far tolerating regular titration well although expressed concern regarding potential for acute dystonic reactions which she has had in the past with other antipsychotics.  Treatment Plan Summary: Daily contact with patient to assess and evaluate symptoms and progress in treatment, Medication management, Plan Inpatient treatment and Medications as below Encourage group and milieu participation Encourage efforts to work on sobriety and relapse prevention Treatment team working on disposition planning options Start Cogentin 0.5 mg twice daily PRN for EPS or dystonia as needed Continue Vraylar 3 mg daily for mood disorder/psychosis Continue Neurontin 300 mg 3 times daily for anxiety/pain Continue Ativan 0.5 mg every 6 hours PRN for anxiety as needed Continue trazodone 50 mg nightly PRN for insomnia as needed Patient requests heat pad for chronic knee pain Jenne Campus, MD 04/17/2019, 10:55 AM

## 2019-04-17 NOTE — Progress Notes (Signed)
Patient did not attend wrap up group. 

## 2019-04-17 NOTE — Progress Notes (Signed)

## 2019-04-18 DIAGNOSIS — F1994 Other psychoactive substance use, unspecified with psychoactive substance-induced mood disorder: Secondary | ICD-10-CM | POA: Diagnosis not present

## 2019-04-18 LAB — RAPID URINE DRUG SCREEN, HOSP PERFORMED
Amphetamines: NOT DETECTED
Barbiturates: NOT DETECTED
Benzodiazepines: NOT DETECTED
Cocaine: POSITIVE — AB
Opiates: NOT DETECTED
Tetrahydrocannabinol: POSITIVE — AB

## 2019-04-18 LAB — HEMOGLOBIN A1C
Hgb A1c MFr Bld: 4.8 % (ref 4.8–5.6)
Hgb A1c MFr Bld: 5 % (ref 4.8–5.6)
Mean Plasma Glucose: 91 mg/dL
Mean Plasma Glucose: 97 mg/dL

## 2019-04-18 MED ORDER — FAMOTIDINE 20 MG PO TABS
20.0000 mg | ORAL_TABLET | Freq: Two times a day (BID) | ORAL | Status: DC
  Administered 2019-04-18 – 2019-04-19 (×2): 20 mg via ORAL
  Filled 2019-04-18 (×4): qty 1

## 2019-04-18 NOTE — Progress Notes (Signed)
[facility] MD Progress Note  04/18/2019 10:35 AM Cynthia Odonnell  MRN:  [id number] Subjective: Patient is a 33 year old female with a past psychiatric history significant for schizoaffective disorder; depressive type and cocaine dependence who presented to the behavioral health hospital on 04/15/2019 with suicidal ideation as well as auditory and visual hallucinations.  Objective: Patient is seen and examined.  Patient is a 33 year old female with the above-stated past psychiatric history who is seen in follow-up.  She stated she still does not feel well.  She stated she did not sleep well last night because of heartburn.  She stated at home she will prop her self on pillows, now to get better.  She denied any suicidal ideation.  She denied any auditory or visual hallucinations.  She stated that she had remained sober for approximately a month after she had gotten out of the hospital on her last admission on 03/05/2018.  She had been given bradycardia [place] recently and tolerated it by report.  She was restarted to Vraylar 3 mg p.o. daily and Neurontin 300 mg p.o. 3 times daily on admission.  Her vital signs are stable, she is afebrile.  Nursing notes reflect that she slept 5 hours last night.  Review of her laboratories revealed essentially normal electrolytes, normal CBC, negative pregnancy test, normal TSH, and a urinalysis which showed 21-50 squamous epithelial cells and calcium oxalate crystals.  Drug screen was positive for cocaine and marijuana.  She reportedly also had been drinking alcohol, but a blood alcohol was not obtained.  Principal Problem: Substance induced mood disorder (HCC) Diagnosis: Principal Problem:   Substance induced mood disorder (HCC) Active Problems:   Bipolar 1 disorder, mixed, severe (HCC)   Substance use disorder  Total Time spent with patient: 20 minutes  Past Psychiatric History: See admission H&P  Past Medical History:  Past Medical History:  Diagnosis Date  . Abnormal Pap  smear of cervix   . Anxiety   . Arthritis   . Bronchitis    Hx - uses albuterol inhaler prn  . Cocaine abuse, in remission (HCC)   . Depression   . GERD (gastroesophageal reflux disease)    zantac prn  . Headache(784.0)    otc prn  . Optic neuritis    left eye  . Smoker   . Termination of pregnancy (fetus)    x 2    Past Surgical History:  Procedure Laterality Date  . CESAREAN SECTION  07/31/2008   [facility]  . CESAREAN SECTION N/A 11/04/2014   Procedure: CESAREAN SECTION;  Surgeon: Tracey Harries, MD;  Location: [facility];  Service: Obstetrics;  Laterality: N/A;  . COLPOSCOPY    . WISDOM TOOTH EXTRACTION     Family History:  Family History  Problem Relation Age of Onset  . Other Neg Hx    Family Psychiatric  History: See admission H&P Social History:  Social History   Substance and Sexual Activity  Alcohol Use Yes   Comment: Beer: Tonight      Social History   Substance and Sexual Activity  Drug Use Yes  . Frequency: 3.0 times per week  . Types: Cocaine, Methamphetamines   Comment: Last used: yesterday     Social History   Socioeconomic History  . Marital status: Married    Spouse name: Not on file  . Number of children: Not on file  . Years of education: Not on file  . Highest education level: Not on file  Occupational History  . Not on file  Social Needs  . Financial resource strain: Not on file  . Food insecurity    Worry: Not on file    Inability: Not on file  . Transportation needs    Medical: Not on file    Non-medical: Not on file  Tobacco Use  . Smoking status: Current Every Day Smoker    Packs/day: 1.00    Years: 16.00    Pack years: 16.00    Types: Cigarettes  . Smokeless tobacco: Never Used  Substance and Sexual Activity  . Alcohol use: Yes    Comment: Beer: Tonight   . Drug use: Yes    Frequency: 3.0 times per week    Types: Cocaine, Methamphetamines    Comment: Last used: yesterday   . Sexual activity: Yes    Birth control/protection:  Implant    Comment: implant "old"  Lifestyle  . Physical activity    Days per week: Not on file    Minutes per session: Not on file  . Stress: Not on file  Relationships  . Social Musicianconnections    Talks on phone: Not on file    Gets together: Not on file    Attends religious service: Not on file    Active member of club or organization: Not on file    Attends meetings of clubs or organizations: Not on file    Relationship status: Not on file  Other Topics Concern  . Not on file  Social History Narrative  . Not on file   Additional Social History:    Pain Medications: See MAR Prescriptions: See MAR Over the Counter: See MAR History of alcohol / drug use?: Yes Longest period of sobriety (when/how long): Unknown Negative Consequences of Use: Personal relationships Withdrawal Symptoms: Irritability, Agitation Name of Substance 1: Cocaine 1 - Age of First Use: 16 1 - Amount (size/oz): 1 gram 1 - Frequency: Daily 1 - Duration: Ongoing 1 - Last Use / Amount: 04/15/19 1 gram                  Sleep: Fair  Appetite:  Fair  Current Medications: Current Facility-Administered Medications  Medication Dose Route Frequency Provider Last Rate Last Dose  . acetaminophen (TYLENOL) tablet 650 mg  650 mg Oral Q6H PRN Laveda AbbeParks, Laurie Britton, NP      . benztropine (COGENTIN) tablet 0.5 mg  0.5 mg Oral BID PRN Cobos, Rockey SituFernando A, MD   0.5 mg at 04/17/19 1853  . cariprazine (VRAYLAR) capsule 3 mg  3 mg Oral Daily Cobos, Rockey SituFernando A, MD   3 mg at 04/18/19 0742  . gabapentin (NEURONTIN) capsule 300 mg  300 mg Oral TID Cobos, Rockey SituFernando A, MD   300 mg at 04/18/19 0742  . hydrOXYzine (ATARAX/VISTARIL) tablet 25 mg  25 mg Oral Q6H PRN Nira ConnBerry, Jason A, NP      . LORazepam (ATIVAN) tablet 0.5 mg  0.5 mg Oral Q6H PRN Cobos, Rockey SituFernando A, MD   0.5 mg at 04/18/19 0742  . ziprasidone (GEODON) injection 10 mg  10 mg Intramuscular PRN Cobos, Rockey SituFernando A, MD       And  . OLANZapine zydis (ZYPREXA)  disintegrating tablet 5 mg  5 mg Oral Q8H PRN Cobos, Rockey SituFernando A, MD       And  . LORazepam (ATIVAN) tablet 1 mg  1 mg Oral PRN Cobos, Rockey SituFernando A, MD      . nicotine (NICODERM CQ - dosed in mg/24 hours) patch 21 mg  21 mg Transdermal Daily  Ethelene Hal, NP   21 mg at 04/18/19 0743  . traZODone (DESYREL) tablet 100 mg  100 mg Oral QHS PRN Rozetta Nunnery, NP        Lab Results:  Results for orders placed or performed during the hospital encounter of 04/15/19 (from the past 48 hour(s))  hCG, serum, qualitative     Status: None   Collection Time: 04/17/19  6:59 AM  Result Value Ref Range   Preg, Serum NEGATIVE NEGATIVE    Comment: NEGATIVE        THE SENSITIVITY OF THIS METHODOLOGY IS >10 mIU/mL. Performed at [facility], [place] [address] [place], [place] [address]     Blood Alcohol level:  Lab Results  Component Value Date   ETH <10 03/04/2018   [facility]: Lambert Rhonda W  01/28/2009    <5        LOWEST DETECTABLE LIMIT FOR SERUM ALCOHOL IS 5 mg/dL FOR MEDICAL PURPOSES ONLY    Metabolic Disorder Labs: No results found for: HGBA1C, MPG No results found for: PROLACTIN Lab Results  Component Value Date   CHOL 175 04/16/2019   TRIG 126 04/16/2019   HDL 38 (L) 04/16/2019   CHOLHDL 4.6 04/16/2019   VLDL 25 04/16/2019   LDLCALC 112 (H) 04/16/2019    Physical Findings: AIMS: Facial and Oral Movements Muscles of Facial Expression: None, normal Lips and Perioral Area: None, normal Jaw: None, normal Tongue: None, normal,Extremity Movements Upper (arms, wrists, hands, fingers): None, normal Lower (legs, knees, ankles, toes): None, normal, Trunk Movements Neck, shoulders, hips: None, normal, Overall Severity Severity of abnormal movements (highest score from questions above): None, normal Incapacitation due to abnormal movements: None, normal Patient's awareness of abnormal movements (rate only patient's report): No Awareness, Dental Status Current problems with teeth  and/or dentures?: No Does patient usually wear dentures?: No  CIWA:  CIWA-Ar Total: 2 COWS:  COWS Total Score: 2  Musculoskeletal: Strength & Muscle Tone: within normal limits Gait & Station: normal Patient leans: N/A  Psychiatric Specialty Exam: Physical Exam  Nursing note and vitals reviewed. Constitutional: She is oriented to person, place, and time. She appears well-developed and well-nourished.  HENT:  Head: Normocephalic and atraumatic.  Respiratory: Effort normal.  Neurological: She is alert and oriented to person, place, and time.    ROS  Blood pressure 112/82, pulse 81, temperature 98.2 F (36.8 C), temperature source Oral, resp. rate 20, SpO2 97 %, unknown if currently breastfeeding.There is no height or weight on file to calculate BMI.  General Appearance: Disheveled  Eye Contact:  Minimal  Speech:  Normal Rate  Volume:  Decreased  Mood:  Depressed and Dysphoric  Affect:  Blunt  Thought Process:  Coherent and Descriptions of Associations: Circumstantial  Orientation:  Full (Time, Place, and Person)  Thought Content:  Logical  Suicidal Thoughts:  No  Homicidal Thoughts:  No  Memory:  Immediate;   Fair Recent;   Fair Remote;   Fair  Judgement:  Impaired  Insight:  Fair  Psychomotor Activity:  Psychomotor Retardation  Concentration:  Concentration: Fair and Attention Span: Fair  Recall:  [employer] of Knowledge:  Fair  Language:  Fair  Akathisia:  Negative  Handed:  Right  AIMS (if indicated):     Assets:  Desire for Improvement Resilience  ADL's:  Intact  Cognition:  WNL  Sleep:  Number of Hours: 5     Treatment Plan Summary: Daily contact with patient to assess and evaluate symptoms and progress  in treatment, Medication management and Plan : Patient is seen and examined.  Patient is a 33 year old female with the above-stated past psychiatric history who is seen in follow-up.   Diagnosis: #1 schizoaffective disorder; depressive type, #2 cocaine  dependence, #3 cannabis use disorder versus cannabis dependence, #4 GERD  Patient is seen in follow-up.  She still looks very rough.  No change in her psychiatric medications today.  She is having heartburn symptoms which are causing problems with her sleep.  I will place her on Pepcid 20 mg p.o. twice daily.  We will continue her trazodone at 100 mg p.o. nightly as needed.  She denied any auditory or visual hallucinations.  She denied any suicidal ideation. 1.  Continue Cogentin 0.5 mg p.o. twice daily as needed side effects of medications. 2.  Continue Vraylar 3 mg p.o. daily for psychosis. 3.  Continue Neurontin 300 mg p.o. 3 times daily for mood stability and chronic pain. 4.  Continue lorazepam 0.5 mg p.o. every 6 hours as needed anxiety. 5.  Continue trazodone 100 mg p.o. nightly as needed insomnia. 6.  Add Pepcid 20 mg p.o. twice daily first dose now for reflux symptoms. 7.  Disposition planning-in progress.  Antonieta PertGreg Lawson Alexarae Oliva, MD 04/18/2019, 10:35 AM

## 2019-04-18 NOTE — Progress Notes (Signed)
Patient ID: Cynthia Odonnell, female   DOB: 1986-05-25, 33 y.o.   MRN: [id number]   [place] NOVEL CORONAVIRUS (COVID-19) DAILY CHECK-OFF SYMPTOMS - answer yes or no to each - every day NO YES  Have you had a fever in the past 24 hours?  . Fever (Temp > 37.80C / 100F) X   Have you had any of these symptoms in the past 24 hours? . New Cough .  Sore Throat  .  Shortness of Breath .  Difficulty Breathing .  Unexplained Body Aches   X   Have you had any one of these symptoms in the past 24 hours not related to allergies?   . Runny Nose .  Nasal Congestion .  Sneezing   X   If you have had runny nose, nasal congestion, sneezing in the past 24 hours, has it worsened?  X   EXPOSURES - check yes or no X   Have you traveled outside the state in the past 14 days?  X   Have you been in contact with someone with a confirmed diagnosis of COVID-19 or PUI in the past 14 days without wearing appropriate PPE?  X   Have you been living in the same home as a person with confirmed diagnosis of COVID-19 or a PUI (household contact)?    X   Have you been diagnosed with COVID-19?    X              What to do next: Answered NO to all: Answered YES to anything:   Proceed with unit schedule Follow the [facility] Inpatient Flowsheet.

## 2019-04-18 NOTE — Progress Notes (Signed)
The patient's positive event for the day is that she was able to take a bath. Her other positive event is that she announced that she has been clean for the past three days from drugs. Her goal for tomorrow is to attend more groups if they are offered.

## 2019-04-18 NOTE — Progress Notes (Signed)
Recreation Therapy Notes  Date:  9.14.20 Time: 0930 Location: [address] Dayroom  Group Topic: Stress Management  Goal Area(s) Addresses:  Patient will identify positive stress management techniques. Patient will identify benefits of using stress management post d/c.  Intervention: Stress Management  Activity :  Meditation.  LRT introduced the stress management technique of meditation.  LRT played a meditation that focused on making the most of your day.  Patients were to follow along as meditation played to engage in activity.  Education:  Stress Management, Discharge Planning.   Education Outcome: Acknowledges Education  Clinical Observations/Feedback:  Pt did not attend group.    Victorino Sparrow, LRT/CTRS         Ria Comment, Talaya Lamprecht A 04/18/2019 10:03 AM
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
## 2019-04-18 NOTE — Plan of Care (Signed)
  Problem: Education: Goal: Verbalization of understanding the information provided will improve Outcome: Progressing   Problem: Coping: Goal: Ability to demonstrate self-control will improve Outcome: Progressing   Problem: Safety: Goal: Periods of time without injury will increase Outcome: Progressing   

## 2019-04-18 NOTE — Progress Notes (Signed)
D. Pt did not get up for wrap up group but did come to the nurses station to request medication for sleep.  She also requested anxiety medication as it was not yet time that she could have an Ativan.  Pt denies SI/HI/AVH at this time.  A.  Spoke with NP who raised Trazodone dose to 100 mg and ordered Vistaril 25 mg for anxiety.  Support and encouragement offered to patient, medication given as ordered  R.  Pt remains safe on the unit, will continue to monitor.

## 2019-04-19 DIAGNOSIS — F1994 Other psychoactive substance use, unspecified with psychoactive substance-induced mood disorder: Secondary | ICD-10-CM | POA: Diagnosis not present

## 2019-04-19 MED ORDER — HYDROXYZINE HCL 50 MG PO TABS
50.0000 mg | ORAL_TABLET | Freq: Four times a day (QID) | ORAL | Status: DC | PRN
  Filled 2019-04-19: qty 10

## 2019-04-19 MED ORDER — BACLOFEN 10 MG PO TABS: 10.0000 mg | ORAL_TABLET | Freq: Three times a day (TID) | ORAL | 1 refills | Status: AC

## 2019-04-19 MED ORDER — CARIPRAZINE HCL 3 MG PO CAPS: 3.0000 mg | ORAL_CAPSULE | Freq: Every day | ORAL | 2 refills | Status: DC

## 2019-04-19 MED ORDER — GABAPENTIN 300 MG PO CAPS: 300.0000 mg | ORAL_CAPSULE | Freq: Three times a day (TID) | ORAL | 3 refills | Status: DC

## 2019-04-19 MED ORDER — TRAZODONE HCL 100 MG PO TABS: 100.0000 mg | ORAL_TABLET | Freq: Every evening | ORAL | 1 refills | Status: DC | PRN

## 2019-04-19 MED ORDER — BACLOFEN 10 MG PO TABS
10.0000 mg | ORAL_TABLET | Freq: Three times a day (TID) | ORAL | Status: DC
  Filled 2019-04-19: qty 21

## 2019-04-19 MED ORDER — BENZTROPINE MESYLATE 0.5 MG PO TABS: 0.5000 mg | ORAL_TABLET | Freq: Two times a day (BID) | ORAL | 2 refills | Status: DC | PRN

## 2019-04-19 NOTE — Discharge Summary (Signed)
Physician Discharge Summary Note  Patient:  Cynthia Odonnell is an 33 y.o., female MRN:  [id number] DOB:  Jul 10, 1986 Patient phone:  [phone] (home)  Patient address:   [place] [address],  Total Time spent with patient: 15 minutes  Date of Admission:  04/15/2019 Date of Discharge: 04/19/19  Reason for Admission:  Cocaine use disorder  Principal Problem: Substance induced mood disorder [facility]) Discharge Diagnoses: Principal Problem:   Substance induced mood disorder ([place]) Active Problems:   Bipolar 1 disorder, mixed, severe ([place])   Substance use disorder   Past Psychiatric History: Reports she has been diagnosed with Bipolar Disorder and with Schizoaffective Disorder in the past . She reports cocaine exacerbates symptoms but describes having mood symptoms even when sober and psychotic symptoms even prior to addiction onset. She has a history of prior psychiatric admissions, most recently here at [facility] in August of 2019. At the time presented for substance abuse, depression, mood symptoms. Reports history of suicide attempts in the past, most recently 3 years ago. Past history of cutting wrist 3 years ago.  Past Medical History:  Past Medical History:  Diagnosis Date  . Abnormal Pap smear of cervix   . Anxiety   . Arthritis   . Bronchitis    Hx - uses albuterol inhaler prn  . Cocaine abuse, in remission ([place])   . Depression   . GERD (gastroesophageal reflux disease)    zantac prn  . Headache(784.0)    otc prn  . Optic neuritis    left eye  . Smoker   . Termination of pregnancy (fetus)    x 2    Past Surgical History:  Procedure Laterality Date  . CESAREAN SECTION  07/31/2008   [place]  . CESAREAN SECTION N/A 11/04/2014   Procedure: CESAREAN SECTION;  Surgeon: Newton Pigg, MD;  Location: [facility];  Service: Obstetrics;  Laterality: N/A;  . COLPOSCOPY    . WISDOM TOOTH EXTRACTION     Family History:  Family History  Problem Relation Age of Onset  . Other  Neg Hx    Family Psychiatric  History: reports biological mother has history of cocaine use disorder and suspects she may have Bipolar Disorder. Reports history of alcohol use disorder in extended family.  Social History:  Social History   Substance and Sexual Activity  Alcohol Use Yes   Comment: Beer: Tonight      Social History   Substance and Sexual Activity  Drug Use Yes  . Frequency: 3.0 times per week  . Types: Cocaine, Methamphetamines   Comment: Last used: yesterday     Social History   Socioeconomic History  . Marital status: Married    Spouse name: Not on file  . Number of children: Not on file  . Years of education: Not on file  . Highest education level: Not on file  Occupational History  . Not on file  Social Needs  . Financial resource strain: Not on file  . Food insecurity    Worry: Not on file    Inability: Not on file  . Transportation needs    Medical: Not on file    Non-medical: Not on file  Tobacco Use  . Smoking status: Current Every Day Smoker    Packs/day: 1.00    Years: 16.00    Pack years: 16.00    Types: Cigarettes  . Smokeless tobacco: Never Used  Substance and Sexual Activity  . Alcohol use: Yes    Comment: Beer:  Tonight   . Drug use: Yes    Frequency: 3.0 times per week    Types: Cocaine, Methamphetamines    Comment: Last used: yesterday   . Sexual activity: Yes    Birth control/protection: Implant    Comment: implant "old"  Lifestyle  . Physical activity    Days per week: Not on file    Minutes per session: Not on file  . Stress: Not on file  Relationships  . Social Musicianconnections    Talks on phone: Not on file    Gets together: Not on file    Attends religious service: Not on file    Active member of club or organization: Not on file    Attends meetings of clubs or organizations: Not on file    Relationship status: Not on file  Other Topics Concern  . Not on file  Social History Narrative  . Not on file    Hospital  Course:  From admission assessment: Cynthia Odonnell is an 33 y.o. female that presents this date voluntary with S/I. Patient denies any specific plan and states when asked in reference to self harm, "I just can't go on like this." Patient denies any H/I although reports active AVH to include hearing voices that tell her to use cocaine and seeing "shadow people." Patient is observed to be very anxious and displays flight of ideas. Patient is liable and difficult to redirect. Patient is observed to be impaired at the time of assessment reporting she used 1 gram of cocaine prior to presenting. Patient states she has been using cocaine since age 33 reporting weekly use "for years" that evolved into using three to four times a week the last two years and daily use for the last six months. Patient states she uses 1 gram or more daily by nasal ingestion. Patient denies the use of any other illicit substance or IV drug use  From admission H&P: [age] y old married female.  Presented to ED voluntarily. She reports long history of cocaine use disorder. She has been using daily. States she had recently visited a psychic because " I felt that the cocaine addiction is like the devil's work , like some sort of curse I have ". States this person recommended that she try to stay sober for at least three days. Reports " I tried but I couldn't ".  She reports symptoms including feeling "depressed but also manic", anxious,  also endorses racing thoughts and pressured speech.Endorses some neuro-vegetative symptoms , mainly a sense of anhedonia and frequent sadness. Denies suicidal ideations. Regarding psychotic symptoms , reports long history of intermittent visual hallucinations , states she sees a shadow that she calls " the hat man" for years, and more often recently. She presents with religious preoccupations, and  feels that the devil is actively involved in making it harder for her to be able to get sober because " as long as I am  using it is easier for the devil to get my soul".  Ms. Cynthia Odonnell was admitted for cocaine use with suicidal ideation. [facility]. Vraylar was increased and Neurontin continued. She participated in group therapy on the unit. She responded well to treatment with no adverse effects reported. She has shown improved mood, affect, sleep, and interaction. On day of discharge, she presents with euthymic affect and reports stable mood. She denies any SI/HI/AVH and contracts for safety. She denies withdrawal symptoms. She is discharging on the medications  listed below. She agrees to follow up at [facility] (see below). She is provided with prescriptions for medications upon discharge. Her husband is picking her up for discharge home.  Physical Findings: AIMS: Facial and Oral Movements Muscles of Facial Expression: None, normal Lips and Perioral Area: None, normal Jaw: None, normal Tongue: None, normal,Extremity Movements Upper (arms, wrists, hands, fingers): None, normal Lower (legs, knees, ankles, toes): None, normal, Trunk Movements Neck, shoulders, hips: None, normal, Overall Severity Severity of abnormal movements (highest score from questions above): None, normal Incapacitation due to abnormal movements: None, normal Patient's awareness of abnormal movements (rate only patient's report): No Awareness, Dental Status Current problems with teeth and/or dentures?: No Does patient usually wear dentures?: No  CIWA:  CIWA-Ar Total: 2 COWS:  COWS Total Score: 2  Musculoskeletal: Strength & Muscle Tone: within normal limits Gait & Station: normal Patient leans: N/A  Psychiatric Specialty Exam: Physical Exam  Nursing note and vitals reviewed. Constitutional: She is oriented to person, place, and time. She appears well-developed and well-nourished.  Cardiovascular: Normal rate.  Respiratory: Effort normal.  Neurological: She is alert and oriented to person, place, and time.     Review of Systems  Constitutional: Negative.   Respiratory: Negative for cough and shortness of breath.   Cardiovascular: Negative for chest pain.  Gastrointestinal: Negative for nausea and vomiting.  Neurological: Negative for headaches.  Psychiatric/Behavioral: Positive for depression (stable on medication) and substance abuse (cocaine, THC). Negative for hallucinations and suicidal ideas. The patient is not nervous/anxious and does not have insomnia.     Blood pressure (!) 87/63, pulse (!) 105, temperature 97.8 F (36.6 C), temperature source Oral, resp. rate 20, SpO2 97 %, unknown if currently breastfeeding.There is no height or weight on file to calculate BMI.  See MD's discharge SRA      Has this patient used any form of tobacco in the last 30 days? (Cigarettes, Smokeless Tobacco, Cigars, and/or Pipes)  Yes, A prescription for an FDA-approved tobacco cessation medication was offered at discharge and the patient refused  Blood Alcohol level:  Lab Results  Component Value Date   ETH <10 03/04/2018   [facility]  01/28/2009    <5        LOWEST DETECTABLE LIMIT FOR SERUM ALCOHOL IS 5 mg/dL FOR MEDICAL PURPOSES ONLY    Metabolic Disorder Labs:  Lab Results  Component Value Date   HGBA1C 5.0 04/17/2019   MPG 97 04/17/2019   MPG 91 04/16/2019   No results found for: PROLACTIN Lab Results  Component Value Date   CHOL 175 04/16/2019   TRIG 126 04/16/2019   HDL 38 (L) 04/16/2019   CHOLHDL 4.6 04/16/2019   VLDL 25 04/16/2019   LDLCALC 112 (H) 04/16/2019    See Psychiatric Specialty Exam and Suicide Risk Assessment completed by Attending Physician prior to discharge.  Discharge destination:  Home  Is patient on multiple antipsychotic therapies at discharge:  No   Has Patient had three or more failed trials of antipsychotic monotherapy by history:  No  Recommended Plan for Multiple Antipsychotic Therapies: NA   Allergies as of 04/19/2019      Reactions   Amoxicillin  Hives   Codeine Itching   Latex Itching   Penicillins Hives   Has patient had a PCN reaction causing immediate rash, facial/tongue/throat swelling, SOB or lightheadedness with hypotension: No Has patient had a PCN reaction causing severe rash involving mucus membranes or skin necrosis: No Has patient had a PCN reaction that required  hospitalization: No Has patient had a PCN reaction occurring within the last 10 years: No If all of the above answers are "NO", then may proceed with Cephalosporin use.   Prednisone Itching   Augmentin [amoxicillin-pot Clavulanate] Hives, Rash   Lamictal [lamotrigine] Rash   Tramadol Itching, Rash      Medication List    TAKE these medications     Indication  baclofen 10 MG tablet Commonly known as: LIORESAL Take 1 tablet (10 mg total) by mouth 3 (three) times daily.  Indication: Alcohol Withdrawal Syndrome, Hiccups that are Hard to Cure   benztropine 0.5 MG tablet Commonly known as: COGENTIN Take 1 tablet (0.5 mg total) by mouth 2 (two) times daily as needed (antipsychotic side effects- acute dystonia,EPS). What changed:   medication strength  how much to take  when to take this  reasons to take this  Indication: Extrapyramidal Reaction caused by Medications   cariprazine capsule Commonly known as: VRAYLAR Take 1 capsule (3 mg total) by mouth daily. What changed:   medication strength  how much to take  Indication: Major Depressive Disorder   gabapentin 300 MG capsule Commonly known as: NEURONTIN Take 1 capsule (300 mg total) by mouth 3 (three) times daily. What changed:   medication strength  how much to take  Indication: Abuse or Misuse of Alcohol, Alcohol Withdrawal Syndrome, cocaine withdrawal syndrome   hydrOXYzine 50 MG tablet Commonly known as: ATARAX/VISTARIL Take 1 tablet (50 mg total) by mouth every 6 (six) hours as needed for anxiety.  Indication: Feeling Anxious, Tension   ProAir HFA 108 (90 Base) MCG/ACT  inhaler Generic drug: albuterol Inhale 2 puffs into the lungs every 6 (six) hours as needed.  Indication: Chronic Obstructive Lung Disease   traZODone 100 MG tablet Commonly known as: DESYREL Take 1 tablet (100 mg total) by mouth at bedtime as needed for sleep. What changed:   when to take this  reasons to take this  Indication: Trouble Sleeping      Follow-up Information    [facility] Follow up on 04/26/2019.   Why: Hospital follow up appointment is Tuesday 9/22 at 10:15a.  Please wear a mask and bring your photo ID, insurance card, and current medications.  Contact information: [address] [place] [id number] [phone]           Follow-up recommendations: Activity as tolerated. Diet as recommended by primary care physician. Keep all scheduled follow-up appointments as recommended.   Comments:   Patient is instructed to take all prescribed medications as recommended. Report any side effects or adverse reactions to your outpatient psychiatrist. Patient is instructed to abstain from alcohol and illegal drugs while on prescription medications. In the event of worsening symptoms, patient is instructed to call the crisis hotline, 911, or go to the nearest emergency department for evaluation and treatment.  Signed: Aldean BakerJanet E Aeson Sawyers, NP 04/19/2019, 10:01 AM

## 2019-04-19 NOTE — [facility] Suicide Risk Assessment (Signed)
[facility] Discharge Suicide Risk Assessment   Principal Problem: Substance induced mood disorder ([place]) Discharge Diagnoses: Principal Problem:   Substance induced mood disorder ([place]) Active Problems:   Bipolar 1 disorder, mixed, severe ([place])   Substance use disorder   Total Time spent with patient: 45 minutes  Musculoskeletal: Strength & Muscle Tone: within normal limits Gait & Station: normal Patient leans: N/A  Psychiatric Specialty Exam: ROS  Blood pressure (!) 87/63, pulse (!) 105, temperature 97.8 F (36.6 C), temperature source Oral, resp. rate 20, SpO2 97 %, unknown if currently breastfeeding.There is no height or weight on file to calculate BMI.  General Appearance: Casual  Eye Contact::  Good  Speech:  Clear and Coherent409  Volume:  Normal  Mood:  Euthymic  Affect:  Full Range  Thought Process:  Coherent and Descriptions of Associations: Intact  Orientation:  Full (Time, Place, and Person)  Thought Content:  Rumination  Suicidal Thoughts:  No  Homicidal Thoughts:  No  Memory:  Immediate;   Good Recent;   Good Remote;   Good  Judgement:  Good  Insight:  Good  Psychomotor Activity:  Normal  Concentration:  Good  Recall:  Good  Fund of Knowledge:Good  Language: Good  Akathisia:  Negative  Handed:  Right  AIMS (if indicated):     Assets:  Communication Skills Desire for Improvement  Sleep:  Number of Hours: 5  Cognition: WNL  ADL's:  Intact   Mental Status Per Nursing Assessment::   On Admission:  Suicidal ideation indicated by patient  Demographic Factors:  NA  Loss Factors: Decrease in vocational status  Historical Factors: NA  Risk Reduction Factors:   Sense of responsibility to family and Religious beliefs about death  Continued Clinical Symptoms:  Alcohol/Substance Abuse/Dependencies  Cognitive Features That Contribute To Risk:  None    Suicide Risk:  Minimal: No identifiable suicidal ideation.  Patients presenting with no risk factors  but with morbid ruminations; may be classified as minimal risk based on the severity of the depressive symptoms  Follow-up Information    [facility] Follow up.   Contact information: [address] [place] [address] [phone]           Plan Of Care/Follow-up recommendations:  Activity:  full  Elis Sauber, MD 04/19/2019, 8:03 AM

## 2019-04-19 NOTE — Progress Notes (Signed)
Pt discharged to lobby. Pt was stable and appreciative at that time. All papers, samples and prescriptions were given and valuables returned. Verbal understanding expressed. Denies SI/HI and A/VH. Pt given opportunity to express concerns and ask questions.  

## 2019-04-19 NOTE — Progress Notes (Signed)
D: Writer discussed reported reasons for pt's admittance to [facility]. Pt denied si as was told to the writer in report. Stated, "I came because I did cocaine everyday and want to get clean." Stated, "I have kids, I don't want to harm myself". Pt was tearful during the assessment. Stated, "we're not having groups what's the fucking use of being here". Pt was somewhat intrusive during each meeting the writer had with her. During the shift assessment pt reached for the writer's bracelet turning and commenting on it. On med pass the pt rubbed the writer's ring asking about it as well.    A: Pt asked for and received ativan prn. Support and encouragement was offered. 15 min checks continued for safety.  R: Pt remains safe.

## 2019-04-19 NOTE — Progress Notes (Signed)
  [facility] Adult Case Management Discharge Plan :  Will you be returning to the same living situation after discharge:  Yes,  home At discharge, do you have transportation home?: Yes,  husband picking up Do you have the ability to pay for your medications: No. Referred to [facility].  Release of information consent forms completed and in the chart. Patient to Follow up at: Follow-up Information    [facility] Follow up on 04/26/2019.   Why: Hospital follow up appointment is Tuesday 9/22 at 10:15a.  Please wear a mask and bring your photo ID, insurance card, and current medications.  Contact information: [address] [place] [address] [phone]           Next level of care provider has access to [place] and Suicide Prevention discussed: Yes,  with mother   Has patient been referred to the Quitline?: Patient refused referral  Patient has been referred for addiction treatment: Pt. refused referral  Joellen Jersey, [place] 04/19/2019, 9:00 AM

## 2019-04-24 ENCOUNTER — Encounter (HOSPITAL_BASED_OUTPATIENT_CLINIC_OR_DEPARTMENT_OTHER): Payer: Self-pay | Admitting: Emergency Medicine

## 2019-04-24 ENCOUNTER — Other Ambulatory Visit: Payer: Self-pay

## 2019-04-24 ENCOUNTER — Emergency Department (HOSPITAL_BASED_OUTPATIENT_CLINIC_OR_DEPARTMENT_OTHER)
Admission: EM | Admit: 2019-04-24 | Discharge: 2019-04-24 | Disposition: A | Discharge status: Home / Self Care | Payer: Medicaid Other | Attending: Emergency Medicine | Admitting: Emergency Medicine

## 2019-04-24 DIAGNOSIS — J029 Acute pharyngitis, unspecified: Secondary | ICD-10-CM | POA: Insufficient documentation

## 2019-04-24 DIAGNOSIS — F17210 Nicotine dependence, cigarettes, uncomplicated: Secondary | ICD-10-CM | POA: Insufficient documentation

## 2019-04-24 MED ORDER — FLUCONAZOLE 200 MG PO TABS: 200.0000 mg | ORAL_TABLET | Freq: Two times a day (BID) | ORAL | 0 refills | Status: DC

## 2019-04-24 MED ORDER — FLUCONAZOLE 200 MG PO TABS: 200.0000 mg | ORAL_TABLET | Freq: Once | ORAL | 0 refills | Status: DC

## 2019-04-24 MED ORDER — DEXAMETHASONE 6 MG PO TABS
10.0000 mg | ORAL_TABLET | Freq: Once | ORAL | Status: AC
  Administered 2019-04-24: 10 mg via ORAL
  Filled 2019-04-24: qty 1

## 2019-04-24 MED ORDER — AZITHROMYCIN 250 MG PO TABS: 250.0000 mg | ORAL_TABLET | Freq: Every day | ORAL | 0 refills | Status: DC

## 2019-04-24 NOTE — ED Triage Notes (Signed)
Pt here with URI symptoms x 3 days and a yeast infection x 1 month.

## 2019-04-24 NOTE — ED Provider Notes (Signed)
[facility] EMERGENCY DEPARTMENT Provider Note   CSN: [id number] Arrival date & time: 04/24/19  8676     History   Chief Complaint Chief Complaint  Patient presents with  . Nasal Congestion  . Laryngitis  . Vaginitis    HPI Cynthia Odonnell is a 33 y.o. female.     Patient with sore throat for the last several days.  History of smoking, history of laryngitis in the past.  Weather changes made things worse.  Denies being pregnant.  Currently on her menstrual cycle.  Has had yeast infection for the past month.  The history is provided by the patient.  Sore Throat This is a new problem. The current episode started more than 2 days ago. The problem occurs daily. The problem has not changed since onset.Pertinent negatives include no chest pain, no abdominal pain and no shortness of breath. Nothing aggravates the symptoms. Relieved by: warm fluids. She has tried water for the symptoms. The treatment provided mild relief.    Past Medical History:  Diagnosis Date  . Abnormal Pap smear of cervix   . Anxiety   . Arthritis   . Bronchitis    Hx - uses albuterol inhaler prn  . Cocaine abuse, in remission (HCC)   . Depression   . GERD (gastroesophageal reflux disease)    zantac prn  . Headache(784.0)    otc prn  . Optic neuritis    left eye  . Smoker   . Termination of pregnancy (fetus)    x 2    Patient Active Problem List   Diagnosis Date Noted  . Substance use disorder 04/16/2019  . Substance induced mood disorder (HCC) 03/06/2018  . Bipolar 1 disorder, mixed, severe (HCC) 03/05/2018  . Bipolar affective disorder (HCC)   . Bradycardia   . Cocaine abuse (HCC)   . Hypotension   . Suicide attempt (HCC)   . H/O cesarean section 11/04/2014  . [redacted] weeks gestation of pregnancy   . Maternal morbid obesity in third trimester, antepartum (HCC)   . Excessive weight gain in pregnancy in second trimester   . [redacted] weeks gestation of pregnancy   . Smoking   . Poor fetal  growth affecting management of mother in third trimester, antepartum   . [redacted] weeks gestation of pregnancy   . Maternal morbid obesity in second trimester, antepartum (HCC)   . [redacted] weeks gestation of pregnancy   . Evaluate anatomy not seen on prior sonogram   . Tobacco smoking affecting pregnancy in second trimester, antepartum   . Obesity affecting pregnancy in second trimester   . Uterine scar from previous cesarean delivery, antepartum complication   . Encounter for fetal anatomic survey   . [redacted] weeks gestation of pregnancy     Past Surgical History:  Procedure Laterality Date  . CESAREAN SECTION  07/31/2008   [facility]  . CESAREAN SECTION N/A 11/04/2014   Procedure: CESAREAN SECTION;  Surgeon: Tracey Harries, MD;  Location: [facility];  Service: Obstetrics;  Laterality: N/A;  . COLPOSCOPY    . WISDOM TOOTH EXTRACTION       OB History    Gravida  4   Para  2   Term  2   Preterm      AB  2   Living  2     SAB      TAB  2   Ectopic      Multiple  0   Live Births  2  Home Medications    Prior to Admission medications   Medication Sig Start Date End Date Taking? Authorizing Provider  albuterol (PROAIR HFA) 108 (90 Base) MCG/ACT inhaler Inhale 2 puffs into the lungs every 6 (six) hours as needed.    [provider]  azithromycin (ZITHROMAX) 250 MG tablet Take 1 tablet (250 mg total) by mouth daily. Take first 2 tablets together, then 1 every day until finished. 04/24/19   Briggette Najarian, DO  baclofen (LIORESAL) 10 MG tablet Take 1 tablet (10 mg total) by mouth 3 (three) times daily. 04/19/19 04/18/20  Johnn Hai, MD  benztropine (COGENTIN) 0.5 MG tablet Take 1 tablet (0.5 mg total) by mouth 2 (two) times daily as needed (antipsychotic side effects- acute dystonia,EPS). 04/19/19   Johnn Hai, MD  cariprazine (VRAYLAR) capsule Take 1 capsule (3 mg total) by mouth daily. 04/19/19   Johnn Hai, MD  fluconazole (DIFLUCAN) 200 MG tablet Take 1 tablet (200 mg  total) by mouth once for 1 dose. 04/24/19 04/24/19  Annalyce Lanpher, DO  gabapentin (NEURONTIN) 300 MG capsule Take 1 capsule (300 mg total) by mouth 3 (three) times daily. 04/19/19   Johnn Hai, MD  hydrOXYzine (ATARAX/VISTARIL) 50 MG tablet Take 1 tablet (50 mg total) by mouth every 6 (six) hours as needed for anxiety. 03/12/18   Starkes-Perry, Gayland Curry, FNP  traZODone (DESYREL) 100 MG tablet Take 1 tablet (100 mg total) by mouth at bedtime as needed for sleep. 04/19/19   Johnn Hai, MD    Family History Family History  Problem Relation Age of Onset  . Other Neg Hx     Social History Social History   Tobacco Use  . Smoking status: Current Every Day Smoker    Packs/day: 1.00    Years: 16.00    Pack years: 16.00    Types: Cigarettes  . Smokeless tobacco: Never Used  Substance Use Topics  . Alcohol use: Yes    Comment: Beer: Tonight   . Drug use: Yes    Frequency: 3.0 times per week    Types: Cocaine, Methamphetamines    Comment: Last used: yesterday      Allergies   Amoxicillin, Codeine, Latex, Penicillins, Prednisone, Augmentin [amoxicillin-pot clavulanate], Lamictal [lamotrigine], and Tramadol   Review of Systems Review of Systems  Constitutional: Negative for chills and fever.  HENT: Positive for sore throat. Negative for ear pain and trouble swallowing.   Eyes: Negative for pain and visual disturbance.  Respiratory: Negative for cough and shortness of breath.   Cardiovascular: Negative for chest pain and palpitations.  Gastrointestinal: Negative for abdominal pain and vomiting.  Genitourinary: Positive for vaginal discharge. Negative for difficulty urinating, dyspareunia, dysuria and hematuria.  Musculoskeletal: Negative for arthralgias and back pain.  Skin: Negative for color change and rash.  Neurological: Negative for seizures and syncope.  All other systems reviewed and are negative.    Physical Exam Updated Vital Signs BP 139/80   Pulse 79   Temp 98 F  (36.7 C) (Oral)   Resp 20   SpO2 100%   Physical Exam Vitals signs and nursing note reviewed.  Constitutional:      General: She is not in acute distress.    Appearance: She is well-developed.  HENT:     Head: Normocephalic and atraumatic.     Nose: Nose normal.     Mouth/Throat:     Mouth: Mucous membranes are moist.     Pharynx: No oropharyngeal exudate or posterior oropharyngeal erythema.  Eyes:  Extraocular Movements: Extraocular movements intact.     Conjunctiva/sclera: Conjunctivae normal.     Pupils: Pupils are equal, round, and reactive to light.  Neck:     Musculoskeletal: Normal range of motion and neck supple.  Cardiovascular:     Rate and Rhythm: Normal rate and regular rhythm.     Pulses: Normal pulses.     Heart sounds: Normal heart sounds. No murmur.  Pulmonary:     Effort: Pulmonary effort is normal. No respiratory distress.     Breath sounds: Normal breath sounds.  Abdominal:     General: There is no distension.     Palpations: Abdomen is soft.     Tenderness: There is no abdominal tenderness.  Skin:    General: Skin is warm and dry.  Neurological:     General: No focal deficit present.     Mental Status: She is alert.  Psychiatric:        Mood and Affect: Mood normal.      ED Treatments / Results  Labs (all labs ordered are listed, but only abnormal results are displayed) Labs Reviewed - No data to display  EKG None  Radiology No results found.  Procedures Procedures (including critical care time)  Medications Ordered in ED Medications  dexamethasone (DECADRON) tablet 10 mg (has no administration in time range)     Initial Impression / Assessment and Plan / ED Course  I have reviewed the triage vital signs and the nursing notes.  Pertinent labs & imaging results that were available during my care of the patient were reviewed by me and considered in my medical decision making (see chart for details).     Kara Diesara E Feimster is a  33 year old female history of bipolar disorder who presents to the ED with sore throat and yeast infection.  Patient with normal vitals.  No fever.  Patient treated for infection recently and has had yeast infection since antibiotics.  Will like to be treated again for yeast infection.  Denies any concern for STDs.  Patient adamant that she is not pregnant.  She does not want pelvic exam.  She does not want to wait for strep throat testing.  However throat overall appears normal.  She is a smoker.  She has a raspy voice but no signs of deep space neck infection.  No trismus, no signs of abscess in the back of the mouth.  Likely laryngitis.  Patient did not want to be tested for coronavirus.  Will give a dose of Decadron for symptomatic care and treat for infection with a Z-Pak as she has had strep throat in the past.  Patient was offered pelvic exam, urine testing, STD testing, strep pharyngitis testing but she declined.  She was discharged in the ED in good condition.  Given return precautions.  This chart was dictated using voice recognition software.  Despite best efforts to proofread,  errors can occur which can change the documentation meaning.    Final Clinical Impressions(s) / ED Diagnoses   Final diagnoses:  Sore throat    ED Discharge Orders         Ordered    azithromycin (ZITHROMAX) 250 MG tablet  Daily     04/24/19 0724    fluconazole (DIFLUCAN) 200 MG tablet   Once     04/24/19 0724           Virgina NorfolkCuratolo, Keighan Amezcua, DO 04/24/19 [id number]

## 2019-04-25 ENCOUNTER — Observation Stay (HOSPITAL_COMMUNITY)
Admission: RE | Admit: 2019-04-25 | Discharge: 2019-04-25 | Disposition: A | Discharge status: Home / Self Care | Payer: Self-pay | Attending: Psychiatry | Admitting: Psychiatry

## 2019-04-25 ENCOUNTER — Other Ambulatory Visit: Payer: Self-pay

## 2019-04-25 ENCOUNTER — Encounter (HOSPITAL_COMMUNITY): Payer: Self-pay | Admitting: Emergency Medicine

## 2019-04-25 DIAGNOSIS — F1410 Cocaine abuse, uncomplicated: Secondary | ICD-10-CM

## 2019-04-25 DIAGNOSIS — Z885 Allergy status to narcotic agent status: Secondary | ICD-10-CM | POA: Insufficient documentation

## 2019-04-25 DIAGNOSIS — F1414 Cocaine abuse with cocaine-induced mood disorder: Secondary | ICD-10-CM | POA: Insufficient documentation

## 2019-04-25 DIAGNOSIS — Z91040 Latex allergy status: Secondary | ICD-10-CM | POA: Insufficient documentation

## 2019-04-25 DIAGNOSIS — Z560 Unemployment, unspecified: Secondary | ICD-10-CM | POA: Insufficient documentation

## 2019-04-25 DIAGNOSIS — Z20828 Contact with and (suspected) exposure to other viral communicable diseases: Secondary | ICD-10-CM | POA: Insufficient documentation

## 2019-04-25 DIAGNOSIS — Z888 Allergy status to other drugs, medicaments and biological substances status: Secondary | ICD-10-CM | POA: Insufficient documentation

## 2019-04-25 DIAGNOSIS — Z880 Allergy status to penicillin: Secondary | ICD-10-CM | POA: Insufficient documentation

## 2019-04-25 DIAGNOSIS — Z79899 Other long term (current) drug therapy: Secondary | ICD-10-CM | POA: Insufficient documentation

## 2019-04-25 DIAGNOSIS — F1994 Other psychoactive substance use, unspecified with psychoactive substance-induced mood disorder: Secondary | ICD-10-CM | POA: Diagnosis present

## 2019-04-25 DIAGNOSIS — F17210 Nicotine dependence, cigarettes, uncomplicated: Secondary | ICD-10-CM | POA: Insufficient documentation

## 2019-04-25 DIAGNOSIS — Z915 Personal history of self-harm: Secondary | ICD-10-CM | POA: Insufficient documentation

## 2019-04-25 DIAGNOSIS — F14120 Cocaine abuse with intoxication, uncomplicated: Secondary | ICD-10-CM | POA: Diagnosis present

## 2019-04-25 DIAGNOSIS — F3162 Bipolar disorder, current episode mixed, moderate: Principal | ICD-10-CM | POA: Insufficient documentation

## 2019-04-25 DIAGNOSIS — F419 Anxiety disorder, unspecified: Secondary | ICD-10-CM | POA: Insufficient documentation

## 2019-04-25 DIAGNOSIS — F259 Schizoaffective disorder, unspecified: Secondary | ICD-10-CM | POA: Insufficient documentation

## 2019-04-25 LAB — SARS CORONAVIRUS 2 BY RT PCR (HOSPITAL ORDER, PERFORMED IN ~~LOC~~ HOSPITAL LAB): SARS Coronavirus 2: NEGATIVE

## 2019-04-25 MED ORDER — ACETAMINOPHEN 325 MG PO TABS: 650.0000 mg | ORAL_TABLET | Freq: Four times a day (QID) | ORAL | Status: DC | PRN

## 2019-04-25 MED ORDER — GABAPENTIN 300 MG PO CAPS
300.0000 mg | ORAL_CAPSULE | Freq: Three times a day (TID) | ORAL | Status: DC
  Administered 2019-04-25 (×2): 300 mg via ORAL
  Filled 2019-04-25 (×2): qty 1

## 2019-04-25 MED ORDER — ALBUTEROL SULFATE HFA 108 (90 BASE) MCG/ACT IN AERS
2.0000 | INHALATION_SPRAY | Freq: Four times a day (QID) | RESPIRATORY_TRACT | Status: DC | PRN
  Administered 2019-04-25: 2 via RESPIRATORY_TRACT

## 2019-04-25 MED ORDER — CARIPRAZINE HCL 3 MG PO CAPS
3.0000 mg | ORAL_CAPSULE | Freq: Every day | ORAL | Status: DC
  Administered 2019-04-25: 3 mg via ORAL
  Filled 2019-04-25 (×3): qty 1

## 2019-04-25 MED ORDER — BENZTROPINE MESYLATE 0.5 MG PO TABS: 0.5000 mg | ORAL_TABLET | Freq: Two times a day (BID) | ORAL | Status: DC | PRN

## 2019-04-25 MED ORDER — BACLOFEN 10 MG PO TABS
10.0000 mg | ORAL_TABLET | Freq: Three times a day (TID) | ORAL | Status: DC
  Administered 2019-04-25 (×2): 10 mg via ORAL
  Filled 2019-04-25 (×2): qty 1

## 2019-04-25 MED ORDER — MAGNESIUM HYDROXIDE 400 MG/5ML PO SUSP: 30.0000 mL | Freq: Every day | ORAL | Status: DC | PRN

## 2019-04-25 MED ORDER — AZITHROMYCIN 250 MG PO TABS
250.0000 mg | ORAL_TABLET | Freq: Every day | ORAL | Status: DC
  Administered 2019-04-25: 250 mg via ORAL
  Filled 2019-04-25: qty 1

## 2019-04-25 MED ORDER — HYDROXYZINE HCL 50 MG PO TABS: 50.0000 mg | ORAL_TABLET | Freq: Four times a day (QID) | ORAL | Status: DC | PRN

## 2019-04-25 MED ORDER — ALUM & MAG HYDROXIDE-SIMETH 200-200-20 MG/5ML PO SUSP: 30.0000 mL | ORAL | Status: DC | PRN

## 2019-04-25 NOTE — [facility] Suicide Risk Assessment (Cosign Needed)
Suicide Risk Assessment  Discharge Assessment   [facility] Discharge Suicide Risk Assessment   Principal Problem: Bipolar 1 disorder, mixed, moderate ([place]) Discharge Diagnoses: Principal Problem:   Bipolar 1 disorder, mixed, moderate (HCC) Active Problems:   Cocaine abuse ([place])   Substance induced mood disorder ([place])   Total Time spent with patient: 30 minutes  Patient denies SI, HI and AVH. Patient denies hx self-harm. Patient has hx of inpatient treatment for substance use disorder, last inpatient at [facility] "last week, I am still taking my meds." Patient states " I had ten days clean and I heard about a friend who had cocaine yesterday and I couldn't stop thinking about it." Patient plans to continue outpatient with [facility]. Patient lives with husband and two children and her mother.  Musculoskeletal: Strength & Muscle Tone: within normal limits Gait & Station: normal Patient leans: N/A  Psychiatric Specialty Exam:   Blood pressure 139/80, pulse 79, temperature 98.3 F (36.8 C), temperature source Oral, resp. rate 20, unknown if currently breastfeeding.There is no height or weight on file to calculate BMI.  General Appearance: Casual  Eye Contact::  Good  Speech:  Clear and Coherent409  Volume:  Normal  Mood:  Euthymic  Affect:  Appropriate  Thought Process:  Coherent and Descriptions of Associations: Intact  Orientation:  Full (Time, Place, and Person)  Thought Content:  Logical  Suicidal Thoughts:  No  Homicidal Thoughts:  No  Memory:  Immediate;   Good Recent;   Good Remote;   Good  Judgement:  Fair  Insight:  Fair  Psychomotor Activity:  Normal  Concentration:  Good  Recall:  Good  Fund of Knowledge:Good  Language: Good  Akathisia:  No  Handed:  Right  AIMS (if indicated):     Assets:  Communication Skills Desire for Improvement Housing Physical Health Social Support Transportation  Sleep:     Cognition: WNL  ADL's:  Intact   Mental Status Per Nursing  Assessment::   On Admission:  Self-harm behaviors  Demographic Factors:  Caucasian  Loss Factors: NA  Historical Factors: Impulsivity  Risk Reduction Factors:   Responsible for children under 70 years of age, Sense of responsibility to family, Living with another person, especially a relative and Positive social support  Continued Clinical Symptoms:  Depression:   Comorbid alcohol abuse/dependence  Cognitive Features That Contribute To Risk:  None    Suicide Risk:  Minimal: No identifiable suicidal ideation.  Patients presenting with no risk factors but with morbid ruminations; may be classified as minimal risk based on the severity of the depressive symptoms    Plan Of Care/Follow-up recommendations:  Take all medications as prescribed. Please attend all follow-up appointments as scheduled. Report any side effects to your outpatient psychiatrist. Abstain from alcohol and illegal drugs while taking prescription medications. In the event of worsening symptoms call the crisis hotline, 911 or go to the nearest emergency department for evaluation and treatment.    Emmaline Kluver, FNP 04/25/2019, 1:44 PM
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
## 2019-04-25 NOTE — Plan of Care (Signed)
[place] Observation Crisis Plan  Reason for Crisis Plan:  Substance Abuse   Plan of Care:  Referral for Substance Abuse  Family Support:      Current Living Environment:  Living Arrangements: Spouse/significant other, Children, Parent  Insurance:   Hospital Account    Name Acct ID Class Status Primary Coverage   [place], [place] [id number] [place] Open None        Guarantor Account (for Hospital Account [id number])    Name Relation to Pt Service Area Active? Acct Type   Domingo Pulse Self [facility] Yes [facility]   Address Phone       [address] [place], [place] [address] [phone]          Coverage Information (for Hospital Account [id number])    Not on file      Legal Guardian:     Primary Care Provider:  Patient, No Pcp Per  Current Outpatient Providers:  [facility]  Psychiatrist:  Name of Psychiatrist: [facility]  Counselor/Therapist:  Name of Therapist: Beverly Sessions  Compliant with Medications:  Yes  Additional Information:   Jesusita Oka 9/21/20206:41 AM

## 2019-04-25 NOTE — Progress Notes (Signed)
Discharge note  Patient verbalizes readiness for discharge. Follow up plan explained, AVS, Transition record and SRA given. Prescriptions and teaching provided. Belongings returned and signed for. Suicide safety plan completed and signed. Patient verbalizes understanding. Patient denies SI/HI and assures this writer she will seek assistance should that change. Patient discharged to courtyard/parking lot where pt's car was parked.

## 2019-04-25 NOTE — Consult Note (Addendum)
[facility] MD DISCHARGE SUMMARY    Reason for Consult:  Observation   Referring Physician:  Dwyane Dee  Patient Identification: Cynthia Odonnell MRN:  [id number] Principal Diagnosis: Bipolar 1 disorder, mixed, moderate ([place]) Diagnosis:  Principal Problem:   Bipolar 1 disorder, mixed, moderate ([place]) Active Problems:   Cocaine abuse ([place])   Substance induced mood disorder ([place])   Total Time spent with patient: 30 minutes  Subjective:   Cynthia Odonnell is a 33 y.o. female patient admitted with Substance use disorder and auditory hallucinations. Patient denies SI, HI and AVH today. Patient denies any hallucinations at this time. Patient alert and oriented during assessment, answers appropriately.  HPI:  Patient denies SI, HI and AVH. Patient denies hx self-harm. Patient has hx of inpatient treatment for substance use disorder, last inpatient at [facility] "last week, I am still taking my meds." Patient states " I had ten days clean and I heard about a friend who had cocaine yesterday and I couldn't stop thinking about it." Patient plans to continue outpatient with [facility]. Patient lives with husband and two children and her mother.  Past Psychiatric History: Bipolar 1 disorder, mixed Cocaine use disorder Substance induced mood disorder  Risk to Self: Suicidal Ideation: No Suicidal Intent: No Is patient at risk for suicide?: No Suicidal Plan?: No Access to Means: No What has been your use of drugs/alcohol within the last 12 months?: cocaine Triggers for Past Attempts: None known Intentional Self Injurious Behavior: None Risk to Others: Homicidal Ideation: No Thoughts of Harm to Others: No Current Homicidal Intent: No Current Homicidal Plan: No Access to Homicidal Means: No History of harm to others?: No Assessment of Violence: None Noted Does patient have access to weapons?: Yes (Comment)(guns in the home) Criminal Charges Pending?: No Does patient have a court date: No Prior Inpatient Therapy:  Prior Inpatient Therapy: Yes Prior Therapy Dates: 2020, 2019, 2017 Prior Therapy Facilty/Provider(s): [facility] Reason for Treatment: Bipolar, Schizoaffective Prior Outpatient Therapy: Prior Outpatient Therapy: Yes Prior Therapy Dates: Ongoing Prior Therapy Facilty/Provider(s): [facility] Reason for Treatment: Med mang Does patient have an ACCT team?: No Does patient have Intensive In-House Services?  : No Does patient have [facility] services? : Yes Does patient have P4[facility] services?: No  Past Medical History:  Past Medical History:  Diagnosis Date  . Abnormal Pap smear of cervix   . Anxiety   . Arthritis   . Bronchitis    Hx - uses albuterol inhaler prn  . Cocaine abuse, in remission ([place])   . Depression   . GERD (gastroesophageal reflux disease)    zantac prn  . Headache(784.0)    otc prn  . Optic neuritis    left eye  . Smoker   . Termination of pregnancy (fetus)    x 2    Past Surgical History:  Procedure Laterality Date  . CESAREAN SECTION  07/31/2008   [place]  . CESAREAN SECTION N/A 11/04/2014   Procedure: CESAREAN SECTION;  Surgeon: Newton Pigg, MD;  Location: [facility];  Service: Obstetrics;  Laterality: N/A;  . COLPOSCOPY    . WISDOM TOOTH EXTRACTION     Family History:  Family History  Problem Relation Age of Onset  . Other Neg Hx    Family Psychiatric  History: reports biological mother has history of cocaine use disorder and suspects she may have Bipolar Disorder. Reports history of alcohol use disorder in extended family. Social History:  Social History   Substance and Sexual Activity  Alcohol Use  Yes   Comment: Beer: Tonight      Social History   Substance and Sexual Activity  Drug Use Yes  . Frequency: 3.0 times per week  . Types: Cocaine, Methamphetamines   Comment: Last used: yesterday     Social History   Socioeconomic History  . Marital status: Married    Spouse name: Not on file  . Number of children: Not on file  . Years of education: Not on  file  . Highest education level: Not on file  Occupational History  . Not on file  Social Needs  . Financial resource strain: Not on file  . Food insecurity    Worry: Not on file    Inability: Not on file  . Transportation needs    Medical: Not on file    Non-medical: Not on file  Tobacco Use  . Smoking status: Current Every Day Smoker    Packs/day: 1.00    Years: 16.00    Pack years: 16.00    Types: Cigarettes  . Smokeless tobacco: Never Used  Substance and Sexual Activity  . Alcohol use: Yes    Comment: Beer: Tonight   . Drug use: Yes    Frequency: 3.0 times per week    Types: Cocaine, Methamphetamines    Comment: Last used: yesterday   . Sexual activity: Yes    Birth control/protection: Implant    Comment: implant "old"  Lifestyle  . Physical activity    Days per week: Not on file    Minutes per session: Not on file  . Stress: Not on file  Relationships  . Social Musicianconnections    Talks on phone: Not on file    Gets together: Not on file    Attends religious service: Not on file    Active member of club or organization: Not on file    Attends meetings of clubs or organizations: Not on file    Relationship status: Not on file  Other Topics Concern  . Not on file  Social History Narrative  . Not on file   Additional Social History: N/A    Allergies:   Allergies  Allergen Reactions  . Amoxicillin Hives  . Codeine Itching  . Latex Itching  . Penicillins Hives    Has patient had a PCN reaction causing immediate rash, facial/tongue/throat swelling, SOB or lightheadedness with hypotension: No Has patient had a PCN reaction causing severe rash involving mucus membranes or skin necrosis: No Has patient had a PCN reaction that required hospitalization: No Has patient had a PCN reaction occurring within the last 10 years: No If all of the above answers are "NO", then may proceed with Cephalosporin use.  . Prednisone Itching  . Augmentin [Amoxicillin-Pot  Clavulanate] Hives and Rash  . Lamictal [Lamotrigine] Rash  . Tramadol Itching and Rash    Labs:  Results for orders placed or performed during the hospital encounter of 04/25/19 (from the past 48 hour(s))  SARS Coronavirus 2 [facility](Hospital order, Performed in [facility] hospital lab) Nasopharyngeal Nasopharyngeal Swab     Status: None   Collection Time: 04/25/19  5:40 AM   Specimen: Nasopharyngeal Swab  Result Value Ref Range   SARS Coronavirus 2 NEGATIVE NEGATIVE    Comment: (NOTE) If result is NEGATIVE SARS-CoV-2 target nucleic acids are NOT DETECTED. The SARS-CoV-2 RNA is generally detectable in upper and lower  respiratory specimens during the acute phase of infection. The lowest  concentration of SARS-CoV-2 viral copies this assay can detect is  250  copies / mL. A negative result does not preclude SARS-CoV-2 infection  and should not be used as the sole basis for treatment or other  patient management decisions.  A negative result may occur with  improper specimen collection / handling, submission of specimen other  than nasopharyngeal swab, presence of viral mutation(s) within the  areas targeted by this assay, and inadequate number of viral copies  (<250 copies / mL). A negative result must be combined with clinical  observations, patient history, and epidemiological information. If result is POSITIVE SARS-CoV-2 target nucleic acids are DETECTED. The SARS-CoV-2 RNA is generally detectable in upper and lower  respiratory specimens dur ing the acute phase of infection.  Positive  results are indicative of active infection with SARS-CoV-2.  Clinical  correlation with patient history and other diagnostic information is  necessary to determine patient infection status.  Positive results do  not rule out bacterial infection or co-infection with other viruses. If result is PRESUMPTIVE POSTIVE SARS-CoV-2 nucleic acids MAY BE PRESENT.   A presumptive positive result was obtained on  the submitted specimen  and confirmed on repeat testing.  While 2019 novel coronavirus  (SARS-CoV-2) nucleic acids may be present in the submitted sample  additional confirmatory testing may be necessary for epidemiological  and / or clinical management purposes  to differentiate between  SARS-CoV-2 and other Sarbecovirus currently known to infect humans.  If clinically indicated additional testing with an alternate test  methodology [phone]([id number]) is advised. The SARS-CoV-2 RNA is generally  detectable in upper and lower respiratory sp ecimens during the acute  phase of infection. The expected result is Negative. Fact Sheet for Patients:  BoilerBrush.com.cyhttps://www.fda.gov/media/136312/download Fact Sheet for Healthcare Providers: https://pope.com/https://www.fda.gov/media/136313/download This test is not yet approved or cleared by the [place] FDA and has been authorized for detection and/or diagnosis of SARS-CoV-2 by FDA under an Emergency Use Authorization (EUA).  This EUA will remain in effect (meaning this test can be used) for the duration of the COVID-19 declaration under Section 564(b)(1) of the Act, 21 U.S.C. section 360bbb-3(b)(1), unless the authorization is terminated or revoked sooner. Performed at [facility], [address] [place], [place] [address]     Current Facility-Administered Medications  Medication Dose Route Frequency Provider Last Rate Last Dose  . acetaminophen (TYLENOL) tablet 650 mg  650 mg Oral Q6H PRN Nira ConnBerry, Jason A, NP      . albuterol (VENTOLIN HFA) 108 (90 Base) MCG/ACT inhaler 2 puff  2 puff Inhalation Q6H PRN Cherly BeachNorman, Jacqueline J, DO   2 puff at 04/25/19 0830  . alum & mag hydroxide-simeth (MAALOX/MYLANTA) 200-200-20 MG/5ML suspension 30 mL  30 mL Oral Q4H PRN Nira ConnBerry, Jason A, NP      . azithromycin (ZITHROMAX) tablet 250 mg  250 mg Oral Daily Nira ConnBerry, Jason A, NP   250 mg at 04/25/19 [id number]  . baclofen (LIORESAL) tablet 10 mg  10 mg Oral TID Nira ConnBerry, Jason A, NP    10 mg at 04/25/19 1205  . benztropine (COGENTIN) tablet 0.5 mg  0.5 mg Oral BID PRN Nira ConnBerry, Jason A, NP      . cariprazine (VRAYLAR) capsule 3 mg  3 mg Oral Daily Nira ConnBerry, Jason A, NP   3 mg at 04/25/19 0830  . gabapentin (NEURONTIN) capsule 300 mg  300 mg Oral TID Nira ConnBerry, Jason A, NP   300 mg at 04/25/19 1205  . hydrOXYzine (ATARAX/VISTARIL) tablet 50 mg  50 mg Oral Q6H PRN Jackelyn PolingBerry, Jason A, NP      .  magnesium hydroxide (MILK OF MAGNESIA) suspension 30 mL  30 mL Oral Daily PRN Jackelyn Poling, NP        Musculoskeletal: Strength & Muscle Tone: within normal limits Gait & Station: normal Patient leans: N/A  Psychiatric Specialty Exam: Physical Exam  Nursing note and vitals reviewed. Constitutional: She is oriented to person, place, and time. She appears well-developed.  HENT:  Head: Normocephalic.  Cardiovascular: Normal rate.  Respiratory: Effort normal.  Neurological: She is alert and oriented to person, place, and time.  Psychiatric: She has a normal mood and affect. Her speech is normal and behavior is normal. Judgment and thought content normal. Cognition and memory are normal.    Review of Systems  Psychiatric/Behavioral: Positive for substance abuse.  All other systems reviewed and are negative.   Blood pressure 139/80, pulse 79, temperature 98.3 F (36.8 C), temperature source Oral, resp. rate 20, unknown if currently breastfeeding.There is no height or weight on file to calculate BMI.  General Appearance: Casual  Eye Contact:  Good  Speech:  Clear and Coherent  Volume:  Normal  Mood:  Euthymic  Affect:  Appropriate  Thought Process:  Coherent and Descriptions of Associations: Intact  Orientation:  Full (Time, Place, and Person)  Thought Content:  Logical  Suicidal Thoughts:  No  Homicidal Thoughts:  No  Memory:  Immediate;   Good Recent;   Good Remote;   Good  Judgement:  Good  Insight:  Good  Psychomotor Activity:  Normal  Concentration:  Concentration: Good and  Attention Span: Good  Recall:  Good  Fund of Knowledge:  Good  Language:  Good  Akathisia:  No  Handed:  Right  AIMS (if indicated):   N/A  Assets:  Architect Housing Social Support  ADL's:  Intact  Cognition:  WNL  Sleep:   N/A     Treatment Plan Summary: Plan to discharge home. Follow up appointment scheduled with [facility] for Tuesday, 04/26/2019.  Disposition: No evidence of imminent risk to self or others at present.   Patient does not meet criteria for psychiatric inpatient admission. Discussed crisis plan, support from social network, calling 911, coming to the Emergency Department, and calling Suicide Hotline.  Patrcia Dolly, FNP 04/25/2019 1:51 PM    Patient seen face-to-face for psychiatric evaluation, chart reviewed and case discussed with the physician extender and developed treatment plan. Reviewed the information documented and agree with the treatment plan.  Juanetta Beets, DO 04/25/19 6:38 PM

## 2019-04-25 NOTE — Discharge Instructions (Signed)
For your behavioral health needs, you are advised to continue treatment at [facility]:       [facility]      [address]      [place], [place] [address]      [phone]      Crisis number: [phone]

## 2019-04-25 NOTE — BH Assessment (Signed)
[facility] Assessment Progress Note  Per Buford Dresser, DO, this pt does not require psychiatric hospitalization at this time.  Pt is to be discharged from [facility] with recommendation to continue treatment at [facility].  This has been included in pt's discharge instructions.  Pt's nurse, Legrand Como, has been notified.  Jalene Mullet, [place] Triage Specialist [phone]

## 2019-04-25 NOTE — H&P (Signed)
Behavioral Health Medical Screening Exam  Cynthia Odonnell is an 33 y.o. female.  Psychiatric Specialty Exam: Physical Exam  Constitutional: She is oriented to person, place, and time. She appears well-developed and well-nourished. No distress.  HENT:  Head: Normocephalic and atraumatic.  Right Ear: External ear normal.  Left Ear: External ear normal.  Eyes: Pupils are equal, round, and reactive to light.  Respiratory: Effort normal. No respiratory distress.  Musculoskeletal: Normal range of motion.  Neurological: She is alert and oriented to person, place, and time.  Skin: She is not diaphoretic.  Psychiatric: Her mood appears anxious. She is not withdrawn and not actively hallucinating. Thought content is not paranoid and not delusional. She expresses impulsivity and inappropriate judgment. She exhibits a depressed mood. She expresses no homicidal and no suicidal ideation.    Review of Systems  Constitutional: Negative for chills, diaphoresis, fever, malaise/fatigue and weight loss.  Respiratory: Positive for cough. Negative for shortness of breath.   Cardiovascular: Negative for chest pain.  Gastrointestinal: Negative for diarrhea, nausea and vomiting.  Psychiatric/Behavioral: Positive for depression, hallucinations, substance abuse and suicidal ideas. Negative for memory loss. The patient is nervous/anxious and has insomnia.     unknown if currently breastfeeding.There is no height or weight on file to calculate BMI.  General Appearance: Casual  Eye Contact:  Good  Speech:  Clear and Coherent and Normal Rate  Volume:  Normal  Mood:  Anxious, Depressed and Hopeless  Affect:  Congruent, Depressed and Tearful  Thought Process:  Coherent, Goal Directed and Descriptions of Associations: Intact  Orientation:  Full (Time, Place, and Person)  Thought Content:  Logical and Hallucinations: Auditory Visual  Suicidal Thoughts:  No  Homicidal Thoughts:  No  Memory:  Immediate;    Good Recent;   Good  Judgement:  Impaired  Insight:  Fair  Psychomotor Activity:  Restlessness  Concentration:  Concentration: Fair and Attention Span: Fair  Recall:  Good  Fund of Knowledge:  Good  Language:  Good  Akathisia:  Negative  Handed:  Right  AIMS (if indicated):     Assets:  Communication Skills Desire for Improvement Housing Intimacy Leisure Time Physical Health  ADL's:  Intact  Cognition:  WNL  Sleep:           Recommendations:  Based on my evaluation the patient does not appear to have an emergency medical condition.  Rozetta Nunnery, NP 04/25/2019, 6:09 AM
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
## 2019-09-20 ENCOUNTER — Other Ambulatory Visit: Payer: Self-pay

## 2019-09-20 ENCOUNTER — Encounter (HOSPITAL_BASED_OUTPATIENT_CLINIC_OR_DEPARTMENT_OTHER): Payer: Self-pay | Admitting: *Deleted

## 2019-09-20 ENCOUNTER — Emergency Department (HOSPITAL_BASED_OUTPATIENT_CLINIC_OR_DEPARTMENT_OTHER)
Admission: EM | Admit: 2019-09-20 | Discharge: 2019-09-20 | Disposition: A | Discharge status: Home / Self Care | Payer: Medicaid Other | Attending: Emergency Medicine | Admitting: Emergency Medicine

## 2019-09-20 DIAGNOSIS — Z79899 Other long term (current) drug therapy: Secondary | ICD-10-CM | POA: Insufficient documentation

## 2019-09-20 DIAGNOSIS — Z91040 Latex allergy status: Secondary | ICD-10-CM | POA: Insufficient documentation

## 2019-09-20 DIAGNOSIS — N760 Acute vaginitis: Secondary | ICD-10-CM | POA: Insufficient documentation

## 2019-09-20 DIAGNOSIS — B9689 Other specified bacterial agents as the cause of diseases classified elsewhere: Secondary | ICD-10-CM

## 2019-09-20 DIAGNOSIS — F17210 Nicotine dependence, cigarettes, uncomplicated: Secondary | ICD-10-CM | POA: Insufficient documentation

## 2019-09-20 DIAGNOSIS — Z202 Contact with and (suspected) exposure to infections with a predominantly sexual mode of transmission: Secondary | ICD-10-CM | POA: Insufficient documentation

## 2019-09-20 LAB — URINALYSIS, MICROSCOPIC (REFLEX)

## 2019-09-20 LAB — PREGNANCY, URINE: Preg Test, Ur: NEGATIVE

## 2019-09-20 LAB — WET PREP, GENITAL
Sperm: NONE SEEN
Trich, Wet Prep: NONE SEEN
Yeast Wet Prep HPF POC: NONE SEEN

## 2019-09-20 LAB — URINALYSIS, ROUTINE W REFLEX MICROSCOPIC
Bilirubin Urine: NEGATIVE
Glucose, UA: NEGATIVE mg/dL
Ketones, ur: NEGATIVE mg/dL
Leukocytes,Ua: NEGATIVE
Nitrite: NEGATIVE
Protein, ur: NEGATIVE mg/dL
Specific Gravity, Urine: 1.025 (ref 1.005–1.030)
pH: 6.5 (ref 5.0–8.0)

## 2019-09-20 LAB — HIV ANTIBODY (ROUTINE TESTING W REFLEX): HIV Screen 4th Generation wRfx: NONREACTIVE

## 2019-09-20 MED ORDER — DOXYCYCLINE HYCLATE 100 MG PO CAPS: 100.0000 mg | ORAL_CAPSULE | Freq: Two times a day (BID) | ORAL | 0 refills | Status: DC

## 2019-09-20 MED ORDER — METRONIDAZOLE 500 MG PO TABS: 500.0000 mg | ORAL_TABLET | Freq: Two times a day (BID) | ORAL | 0 refills | Status: DC

## 2019-09-20 MED ORDER — CEFTRIAXONE SODIUM 500 MG IJ SOLR
500.0000 mg | Freq: Once | INTRAMUSCULAR | Status: AC
  Administered 2019-09-20: 500 mg via INTRAMUSCULAR
  Filled 2019-09-20: qty 500

## 2019-09-20 NOTE — ED Triage Notes (Signed)
Pt c/o vaginal itching , exposure to gonorrhea

## 2019-09-20 NOTE — Discharge Instructions (Signed)
Your wet prep did show evidence of bacterial vaginosis.  This is not an STD.  We will treat this with a medication called Flagyl antibiotic.  You cannot drink alcohol taking this medication.  We have given you medicine for you were exposure to gonorrhea.  Your test for gonorrhea, chlamydia, HIV and syphilis is pending at discharge.  You will be called the next 2 days with these results.  Take the antibiotics as prescribed.  If you have any new or worsening symptoms please seek reaction emergency department or follow-up with the health department

## 2019-09-20 NOTE — ED Provider Notes (Signed)
[facility] EMERGENCY DEPARTMENT Provider Note   CSN: [id number] Arrival date & time: 09/20/19  1623    History Chief Complaint  Patient presents with  . Exposure to STD    Cynthia Odonnell is a 34 y.o. female history significant for depression, polysubstance abuse in remission who presents for evaluation of exposure to STD.  Patient states one of her partners recently tested positive for gonorrhea.  She has no complaints at this time.  She is sexually active with 2 males and 1 female.  She does not use protection.  She is unsure if she could be pregnant.  Denies fever, chills, nausea, vomiting, abdominal pain, pelvic pain, vaginal discharge, dysuria.  Denies additional aggravating relieving factors.  Denies prior history of STDs.  History obtained from patient and past medical records.  No interpreter is used.  HPI     Past Medical History:  Diagnosis Date  . Abnormal Pap smear of cervix   . Anxiety   . Arthritis   . Bronchitis    Hx - uses albuterol inhaler prn  . Cocaine abuse, in remission (HCC)   . Depression   . GERD (gastroesophageal reflux disease)    zantac prn  . Headache(784.0)    otc prn  . Optic neuritis    left eye  . Smoker   . Termination of pregnancy (fetus)    x 2    Patient Active Problem List   Diagnosis Date Noted  . Bipolar 1 disorder, mixed, moderate (HCC) 04/25/2019  . Substance use disorder 04/16/2019  . Substance induced mood disorder (HCC) 03/06/2018  . Bipolar 1 disorder, mixed, severe (HCC) 03/05/2018  . Bipolar affective disorder (HCC)   . Bradycardia   . Cocaine abuse (HCC)   . Hypotension   . Suicide attempt (HCC)   . H/O cesarean section 11/04/2014  . [redacted] weeks gestation of pregnancy   . Maternal morbid obesity in third trimester, antepartum (HCC)   . Excessive weight gain in pregnancy in second trimester   . [redacted] weeks gestation of pregnancy   . Smoking   . Poor fetal growth affecting management of mother in third  trimester, antepartum   . [redacted] weeks gestation of pregnancy   . Maternal morbid obesity in second trimester, antepartum (HCC)   . [redacted] weeks gestation of pregnancy   . Evaluate anatomy not seen on prior sonogram   . Tobacco smoking affecting pregnancy in second trimester, antepartum   . Obesity affecting pregnancy in second trimester   . Uterine scar from previous cesarean delivery, antepartum complication   . Encounter for fetal anatomic survey   . [redacted] weeks gestation of pregnancy     Past Surgical History:  Procedure Laterality Date  . CESAREAN SECTION  07/31/2008   [facility]  . CESAREAN SECTION N/A 11/04/2014   Procedure: CESAREAN SECTION;  Surgeon: Tracey Harries, MD;  Location: [facility];  Service: Obstetrics;  Laterality: N/A;  . COLPOSCOPY    . WISDOM TOOTH EXTRACTION       OB History    Gravida  4   Para  2   Term  2   Preterm      AB  2   Living  2     SAB      TAB  2   Ectopic      Multiple  0   Live Births  2           Family History  Problem Relation Age of  Onset  . Other Neg Hx     Social History   Tobacco Use  . Smoking status: Current Every Day Smoker    Packs/day: 1.00    Years: 16.00    Pack years: 16.00    Types: Cigarettes  . Smokeless tobacco: Never Used  Substance Use Topics  . Alcohol use: Yes    Comment: Beer: Tonight   . Drug use: Yes    Frequency: 3.0 times per week    Types: Cocaine, Methamphetamines    Comment: Last used: yesterday     Home Medications Prior to Admission medications   Medication Sig Start Date End Date Taking? Authorizing Provider  atomoxetine (STRATTERA) 40 MG capsule Take 40 mg by mouth daily.   Yes [provider]  cariprazine (VRAYLAR) capsule Take 1 capsule (3 mg total) by mouth daily. 04/19/19  Yes Johnn Hai, MD  traZODone (DESYREL) 100 MG tablet Take 1 tablet (100 mg total) by mouth at bedtime as needed for sleep. 04/19/19  Yes Johnn Hai, MD  albuterol [facility] HFA) 108 (90 Base) MCG/ACT  inhaler Inhale 2 puffs into the lungs every 6 (six) hours as needed.    [provider]  azithromycin (ZITHROMAX) 250 MG tablet Take 1 tablet (250 mg total) by mouth daily. Take first 2 tablets together, then 1 every day until finished. 04/24/19   Curatolo, Adam, DO  baclofen (LIORESAL) 10 MG tablet Take 1 tablet (10 mg total) by mouth 3 (three) times daily. Patient taking differently: Take 20 mg by mouth 3 (three) times daily.  04/19/19 04/18/20  Johnn Hai, MD  benztropine (COGENTIN) 0.5 MG tablet Take 1 tablet (0.5 mg total) by mouth 2 (two) times daily as needed (antipsychotic side effects- acute dystonia,EPS). Patient taking differently: Take 1-2 mg by mouth 3 (three) times daily as needed (antipsychotic side effects- acute dystonia,EPS).  04/19/19   Johnn Hai, MD  doxycycline (VIBRAMYCIN) 100 MG capsule Take 1 capsule (100 mg total) by mouth 2 (two) times daily. 09/20/19   Shalyn Koral A, PA-C  gabapentin (NEURONTIN) 300 MG capsule Take 1 capsule (300 mg total) by mouth 3 (three) times daily. 04/19/19   Johnn Hai, MD  hydrOXYzine (ATARAX/VISTARIL) 50 MG tablet Take 1 tablet (50 mg total) by mouth every 6 (six) hours as needed for anxiety. 03/12/18   Starkes-Perry, Gayland Curry, FNP  metroNIDAZOLE (FLAGYL) 500 MG tablet Take 1 tablet (500 mg total) by mouth 2 (two) times daily. 09/20/19   Jordell Outten A, PA-C    Allergies    Amoxicillin, Codeine, Latex, Penicillins, Prednisone, Augmentin [amoxicillin-pot clavulanate], Lamictal [lamotrigine], and Tramadol  Review of Systems   Review of Systems  Constitutional: Negative.   HENT: Negative.   Respiratory: Negative.   Cardiovascular: Negative.   Gastrointestinal: Negative.   Genitourinary: Negative.   Musculoskeletal: Negative.   Skin: Negative.   Neurological: Negative.   All other systems reviewed and are negative.   Physical Exam Updated Vital Signs BP 133/85 (BP Location: Right Arm)   Pulse 70   Temp 98.3 F (36.8 C)  (Oral)   Resp 16   Ht 5\' 6"  (1.676 m)   LMP 09/06/2019   SpO2 98%   BMI 35.51 kg/m   Physical Exam Vitals and nursing note reviewed.  Constitutional:      General: She is not in acute distress.    Appearance: She is well-developed. She is not ill-appearing, toxic-appearing or diaphoretic.  HENT:     Head: Normocephalic and atraumatic.  Nose: Nose normal.     Mouth/Throat:     Mouth: Mucous membranes are moist.     Pharynx: Oropharynx is clear.  Eyes:     Pupils: Pupils are equal, round, and reactive to light.  Cardiovascular:     Rate and Rhythm: Normal rate.     Pulses: Normal pulses.     Heart sounds: Normal heart sounds.  Pulmonary:     Effort: Pulmonary effort is normal. No respiratory distress.     Breath sounds: Normal breath sounds.  Abdominal:     General: Bowel sounds are normal. There is no distension.  Genitourinary:    Comments: Normal appearing external female genitalia without rashes or lesions, normal vaginal epithelium,. Normal appearing cervix without discharge or petechiae. Cervical os is closed. There is no bleeding noted at the os.No Odor. Bimanual: No CMT,nontender.  No palpable adnexal masses or tenderness. Uterus midline and not fixed. Rectovaginal exam was deferred.  No cystocele or rectocele noted. No pelvic lymphadenopathy noted. Wet prep was obtained.  Cultures for gonorrhea and chlamydia collected. Exam performed with chaperone in room. Musculoskeletal:        General: Normal range of motion.     Cervical back: Normal range of motion.  Skin:    General: Skin is warm and dry.  Neurological:     Mental Status: She is alert.    ED Results / Procedures / Treatments   Labs (all labs ordered are listed, but only abnormal results are displayed) Labs Reviewed  WET PREP, GENITAL - Abnormal; Notable for the following components:      Result Value   Clue Cells Wet Prep HPF POC PRESENT (*)    WBC, Wet Prep HPF POC MODERATE (*)    All other  components within normal limits  URINALYSIS, ROUTINE W REFLEX MICROSCOPIC - Abnormal; Notable for the following components:   APPearance HAZY (*)    Hgb urine dipstick TRACE (*)    All other components within normal limits  URINALYSIS, MICROSCOPIC (REFLEX) - Abnormal; Notable for the following components:   Bacteria, UA MANY (*)    All other components within normal limits  HIV ANTIBODY (ROUTINE TESTING W REFLEX)  PREGNANCY, URINE  RPR  GC/CHLAMYDIA PROBE AMP ([place]) NOT AT [facility]    EKG None  Radiology No results found.  Procedures Procedures (including critical care time)  Medications Ordered in ED Medications  cefTRIAXone (ROCEPHIN) injection 500 mg (500 mg Intramuscular Given 09/20/19 1949)    ED Course  I have reviewed the triage vital signs and the nursing notes.  Pertinent labs & imaging results that were available during my care of the patient were reviewed by me and considered in my medical decision making (see chart for details).  34 year old female appears otherwise well presents for evaluation of exposure to STD.  She has no complaints at this time.  Sexually active with multiple partners including males and females.  She does not use protection.  Abdomen soft, nontender.  GU exam without any significant findings.  Does have allergy to penicillin however does receive Rocephin in the past without difficulties.  Will order Rocephin, doxy.  Her wet prep was positive for BV.  We will treat her with Flagyl.  She understands not to drink alcohol with this medication.  Low suspicion for PID, torsion, TOA.  Return for any new or worsening symptoms.   Pt understands that they have GC/Chlamydia cultures pending and that they will need to inform all sexual partners if  results return positive.Pt not concerning for PID because hemodynamically stable and no cervical motion tenderness on pelvic exam. Patient to be discharged with instructions to follow up with OBGYN/PCP. Discussed  importance of using protection when sexually active.   The patient has been appropriately medically screened and/or stabilized in the ED. I have low suspicion for any other emergent medical condition which would require further screening, evaluation or treatment in the ED or require inpatient management.  Patient is hemodynamically stable and in no acute distress.  Patient able to ambulate in department prior to ED.  Evaluation does not show acute pathology that would require ongoing or additional emergent interventions while in the emergency department or further inpatient treatment.  I have discussed the diagnosis with the patient and answered all questions.  Pain is been managed while in the emergency department and patient has no further complaints prior to discharge.  Patient is comfortable with plan discussed in room and is stable for discharge at this time.  I have discussed strict return precautions for returning to the emergency department.  Patient was encouraged to follow-up with PCP/specialist refer to at discharge.   Clinical Course as of Sep 19 1956  Tue Sep 20, 2019  1911 Urinalysis, Microscopic (reflex)(!) [ES]    Clinical Course User Index [ES] Myer Haff, Cranston Neighbor   MDM Rules/Calculators/A&P                       Final Clinical Impression(s) / ED Diagnoses Final diagnoses:  STD exposure  Bacterial vaginosis    Rx / DC Orders ED Discharge Orders         Ordered    metroNIDAZOLE (FLAGYL) 500 MG tablet  2 times daily     09/20/19 1953    doxycycline (VIBRAMYCIN) 100 MG capsule  2 times daily     09/20/19 1953           Larkin Morelos A, PA-C 09/20/19 1958    Terald Sleeper, MD 09/20/19 2212

## 2019-09-21 LAB — RPR: RPR Ser Ql: NONREACTIVE

## 2019-09-22 LAB — GC/CHLAMYDIA PROBE AMP (~~LOC~~) NOT AT ARMC
Chlamydia: NEGATIVE
Neisseria Gonorrhea: NEGATIVE

## 2020-04-05 ENCOUNTER — Other Ambulatory Visit: Payer: Self-pay

## 2020-04-05 ENCOUNTER — Ambulatory Visit (INDEPENDENT_AMBULATORY_CARE_PROVIDER_SITE_OTHER): Payer: No Payment, Other | Admitting: Psychiatry

## 2020-04-05 ENCOUNTER — Encounter (HOSPITAL_COMMUNITY): Payer: Self-pay | Admitting: Psychiatry

## 2020-04-05 DIAGNOSIS — F3177 Bipolar disorder, in partial remission, most recent episode mixed: Secondary | ICD-10-CM

## 2020-04-05 DIAGNOSIS — F1420 Cocaine dependence, uncomplicated: Secondary | ICD-10-CM

## 2020-04-05 DIAGNOSIS — F14120 Cocaine abuse with intoxication, uncomplicated: Secondary | ICD-10-CM

## 2020-04-05 MED ORDER — DOXEPIN HCL 25 MG PO CAPS: 25.0000 mg | ORAL_CAPSULE | Freq: Every day | ORAL | 2 refills | Status: DC

## 2020-04-05 MED ORDER — VRAYLAR 6 MG PO CAPS: 6.0000 mg | ORAL_CAPSULE | Freq: Every morning | ORAL | 2 refills | Status: DC

## 2020-04-05 NOTE — Progress Notes (Signed)
Psychiatric Initial Adult Assessment    Patient Identification: Cynthia Odonnell MRN:  [id number] Date of Evaluation:  04/05/2020   Referral Source: PCP  Chief Complaint:   " I am doing good on Vraylar 6 mg."  Visit Diagnosis:    ICD-10-CM   1. Bipolar disorder, in partial remission, most recent episode mixed (HCC)  F31.77   2. Cocaine abuse with intoxication and without complication (HCC)  F14.120   3. Cocaine use disorder, severe, dependence (HCC)  F14.20     History of Present Illness: This is a 34 year old female with history of bipolar disorder who was seen for establishing care.  She stated that she has been on Vraylar 6 mg for quite some time and she finds it very helpful. Patient was noted to be intoxicated.  She stated that she has to use cocaine every day and without using it she is very irritable and angry.  She stated that she works as a Production designer, theatre/television/film at [employer]. and that she uses cocaine even on the days when she is working. She stated that she feels Leafy Kindle has helped her with her mood swings and they are not as frequent and intense as they used to be.  She denied any ongoing manic symptoms like elevated mood or increased energy levels.  She denied any impulsivity or any outbursts.  However she was noted to be quite euphoric as she was under the influence of cocaine at the time of the evaluation.  She denied any auditory or visual hallucinations at this time.  She denied any paranoid delusions.  She has had psychotic symptoms in the past.  She has been seen in the ED and hospitalized in the past at [facility] for manic and psychotic symptoms mostly in the context of cocaine intoxication.  She reported that she sleeps about 4 to 5 hours and can sleep better if she had a sleeping medicine.  She had tried melatonin and trazodone in the past.  She informed the trazodone caused her to have nightmares.  She was offered doxepin and she was willing to try it.  She denied any  suicidal or homicidal ideations.  Patient was asked if she felt comfortable driving herself at home and she replied yes.   Past Psychiatric History: Bipolar d/o, Cocaine use d/o  Previous Psychotropic Medications: Yes   Substance Abuse History in the last 12 months:  Yes.    Consequences of Substance Abuse: Negative  Past Medical History:  Past Medical History:  Diagnosis Date  . Abnormal Pap smear of cervix   . Anxiety   . Arthritis   . Bronchitis    Hx - uses albuterol inhaler prn  . Cocaine abuse, in remission (HCC)   . Depression   . GERD (gastroesophageal reflux disease)    zantac prn  . Headache(784.0)    otc prn  . Optic neuritis    left eye  . Smoker   . Termination of pregnancy (fetus)    x 2    Past Surgical History:  Procedure Laterality Date  . CESAREAN SECTION  07/31/2008   [facility]  . CESAREAN SECTION N/A 11/04/2014   Procedure: CESAREAN SECTION;  Surgeon: Tracey Harries, MD;  Location: [facility];  Service: Obstetrics;  Laterality: N/A;  . COLPOSCOPY    . WISDOM TOOTH EXTRACTION      Family Psychiatric History:  patient reports she is adopted. Was raised by adoptive mother and adoptive grandfather. Reports biological mother has history  of cocaine use disorder and suspects she may have Bipolar Disorder.  Family History:  Family History  Problem Relation Age of Onset  . Other Neg Hx     Social History:   Social History   Socioeconomic History  . Marital status: Married    Spouse name: Not on file  . Number of children: Not on file  . Years of education: Not on file  . Highest education level: Not on file  Occupational History  . Not on file  Tobacco Use  . Smoking status: Current Every Day Smoker    Packs/day: 1.00    Years: 16.00    Pack years: 16.00    Types: Cigarettes  . Smokeless tobacco: Never Used  Substance and Sexual Activity  . Alcohol use: Yes    Comment: Beer: Tonight   . Drug use: Yes    Frequency: 3.0 times per week    Types:  Cocaine, Methamphetamines    Comment: Last used: yesterday   . Sexual activity: Yes    Birth control/protection: Implant    Comment: implant "old"  Other Topics Concern  . Not on file  Social History Narrative  . Not on file   Social Determinants of Health   Financial Resource Strain:   . Difficulty of Paying Living Expenses: Not on file  Food Insecurity:   . Worried About Programme researcher, broadcasting/film/video in the Last Year: Not on file  . Ran Out of Food in the Last Year: Not on file  Transportation Needs:   . Lack of Transportation (Medical): Not on file  . Lack of Transportation (Non-Medical): Not on file  Physical Activity:   . Days of Exercise per Week: Not on file  . Minutes of Exercise per Session: Not on file  Stress:   . Feeling of Stress : Not on file  Social Connections:   . Frequency of Communication with Friends and Family: Not on file  . Frequency of Social Gatherings with Friends and Family: Not on file  . Attends Religious Services: Not on file  . Active Member of Clubs or Organizations: Not on file  . Attends Banker Meetings: Not on file  . Marital Status: Not on file    Additional Social History: Works as a Production designer, theatre/television/film at [employer], has 2 children.  Allergies:   Allergies  Allergen Reactions  . Amoxicillin Hives  . Codeine Itching  . Latex Itching  . Penicillins Hives    Has patient had a PCN reaction causing immediate rash, facial/tongue/throat swelling, SOB or lightheadedness with hypotension: No Has patient had a PCN reaction causing severe rash involving mucus membranes or skin necrosis: No Has patient had a PCN reaction that required hospitalization: No Has patient had a PCN reaction occurring within the last 10 years: No If all of the above answers are "NO", then may proceed with Cephalosporin use.  . Prednisone Itching  . Augmentin [Amoxicillin-Pot Clavulanate] Hives and Rash  . Lamictal [Lamotrigine] Rash  . Tramadol Itching and Rash     Metabolic Disorder Labs: Lab Results  Component Value Date   HGBA1C 5.0 04/17/2019   MPG 97 04/17/2019   MPG 91 04/16/2019   No results found for: PROLACTIN Lab Results  Component Value Date   CHOL 175 04/16/2019   TRIG 126 04/16/2019   HDL 38 (L) 04/16/2019   CHOLHDL 4.6 04/16/2019   VLDL 25 04/16/2019   LDLCALC 112 (H) 04/16/2019   Lab Results  Component Value Date  TSH 2.507 04/16/2019    Therapeutic Level Labs: No results found for: LITHIUM No results found for: CBMZ No results found for: VALPROATE  Current Medications: Current Outpatient Medications  Medication Sig Dispense Refill  . albuterol (PROAIR HFA) 108 (90 Base) MCG/ACT inhaler Inhale 2 puffs into the lungs every 6 (six) hours as needed.    Marland Kitchen. atomoxetine (STRATTERA) 40 MG capsule Take 40 mg by mouth daily.    Marland Kitchen. azithromycin (ZITHROMAX) 250 MG tablet Take 1 tablet (250 mg total) by mouth daily. Take first 2 tablets together, then 1 every day until finished. 6 tablet 0  . baclofen (LIORESAL) 10 MG tablet Take 1 tablet (10 mg total) by mouth 3 (three) times daily. (Patient taking differently: Take 20 mg by mouth 3 (three) times daily. ) 90 tablet 1  . benztropine (COGENTIN) 0.5 MG tablet Take 1 tablet (0.5 mg total) by mouth 2 (two) times daily as needed (antipsychotic side effects- acute dystonia,EPS). (Patient taking differently: Take 1-2 mg by mouth 3 (three) times daily as needed (antipsychotic side effects- acute dystonia,EPS). ) 60 tablet 2  . cariprazine (VRAYLAR) capsule Take 1 capsule (3 mg total) by mouth daily. 30 capsule 2  . doxycycline (VIBRAMYCIN) 100 MG capsule Take 1 capsule (100 mg total) by mouth 2 (two) times daily. 20 capsule 0  . gabapentin (NEURONTIN) 300 MG capsule Take 1 capsule (300 mg total) by mouth 3 (three) times daily. 90 capsule 3  . hydrOXYzine (ATARAX/VISTARIL) 50 MG tablet Take 1 tablet (50 mg total) by mouth every 6 (six) hours as needed for anxiety. 30 tablet 0  .  metroNIDAZOLE (FLAGYL) 500 MG tablet Take 1 tablet (500 mg total) by mouth 2 (two) times daily. 14 tablet 0  . traZODone (DESYREL) 100 MG tablet Take 1 tablet (100 mg total) by mouth at bedtime as needed for sleep. 90 tablet 1   No current facility-administered medications for this visit.    Musculoskeletal: Strength & Muscle Tone: within normal limits Gait & Station: normal Patient leans: Backward  Psychiatric Specialty Exam: Review of Systems  unknown if currently breastfeeding.There is no height or weight on file to calculate BMI.  General Appearance: Fairly Groomed, intoxicated with cocaine  Eye Contact:  Good  Speech:  Clear and Coherent and Slurred, Slightly slurred speech  Volume:  Normal  Mood:  Euphoric  Affect:  Congruent  Thought Process:  Goal Directed and Descriptions of Associations: Intact  Orientation:  Full (Time, Place, and Person)  Thought Content:  Logical  Suicidal Thoughts:  No  Homicidal Thoughts:  No  Memory:  Immediate;   Good Recent;   Good  Judgement:  Fair  Insight:  Fair  Psychomotor Activity:  Decreased  Concentration:  Concentration: Good and Attention Span: Fair  Recall:  Good  Fund of Knowledge:Good  Language: Good  Akathisia:  Negative  Handed:  Right  AIMS (if indicated):  0  Assets:  Communication Skills Desire for Improvement Financial Resources/Insurance Housing Transportation Vocational/Educational  ADL's:  Intact  Cognition: WNL  Sleep:  Good   Screenings: AIMS     Admission (Discharged) from OP Visit from 04/25/2019 [facility] Admission (Discharged) from [facility] Admission (Discharged) from [facility]  AIMS Total Score 0 0 0    AUDIT     Admission (Discharged) from OP Visit from 04/25/2019 in [facility] Admission (Discharged) from OP Visit  from [facility]/2019 [facility]  Alcohol Use Disorder Identification Test Final Score (AUDIT) 0 23    PHQ2-9     US OB FOLLOW UP ADD'L GEST from [facility]2014 [facility] OB FOLLOW UP ADD'L GEST from 09/27/2014 [facility] US OB FOLLOW UP ADD'L GEST from 09/06/2014 [facility]  PHQ-2 Total Score 0 0 0      Assessment and Plan: Patient was noted to be intoxicated with cocaine during the evaluation.  She did answer all the questions logically.  She stated that she has to use it every day.  She even uses it when she is at work.  She believes that Vraylar 6 mg dose is helping and she would like to continue the same for now.  She requested for vouchers to help with the cost.  1. Bipolar disorder, in partial remission, most recent episode mixed (HCC)  - Continue Cariprazine HCl (VRAYLAR) 6 MG CAPS; Take 1 capsule (6 mg total) by mouth in the morning.  Dispense: 30 capsule; Refill: 2 - Samples and Vouchers provided. - Start doxepin (SINEQUAN) 25 MG capsule; Take 1 capsule (25 mg total) by mouth at bedtime.  Dispense: 30 capsule; Refill: 2  2. Cocaine abuse with intoxication and without complication (HCC)   3. Cocaine use disorder, severe, dependence (HCC) - Pt does not intend to cut down or quit use of cocaine.  F/up in 3 months as per pt request.    Zena Amos, MD 9/2/202110:18 AM

## 2020-04-26 ENCOUNTER — Emergency Department (HOSPITAL_BASED_OUTPATIENT_CLINIC_OR_DEPARTMENT_OTHER)
Admission: EM | Admit: 2020-04-26 | Discharge: 2020-04-26 | Disposition: A | Discharge status: Home / Self Care | Payer: HRSA Program | Attending: Emergency Medicine | Admitting: Emergency Medicine

## 2020-04-26 ENCOUNTER — Encounter (HOSPITAL_BASED_OUTPATIENT_CLINIC_OR_DEPARTMENT_OTHER): Payer: Self-pay | Admitting: Emergency Medicine

## 2020-04-26 ENCOUNTER — Other Ambulatory Visit: Payer: Self-pay

## 2020-04-26 DIAGNOSIS — U071 COVID-19: Secondary | ICD-10-CM | POA: Diagnosis not present

## 2020-04-26 DIAGNOSIS — R05 Cough: Secondary | ICD-10-CM | POA: Diagnosis present

## 2020-04-26 DIAGNOSIS — Z5321 Procedure and treatment not carried out due to patient leaving prior to being seen by health care provider: Secondary | ICD-10-CM | POA: Insufficient documentation

## 2020-04-26 LAB — RESPIRATORY PANEL BY RT PCR (FLU A&B, COVID)
Influenza A by PCR: NEGATIVE
Influenza B by PCR: NEGATIVE
SARS Coronavirus 2 by RT PCR: POSITIVE — AB

## 2020-04-26 NOTE — ED Triage Notes (Signed)
Reports was tested x 3 days , positive result, wants retest because "she don' trust the previous source."

## 2020-04-27 ENCOUNTER — Telehealth (HOSPITAL_COMMUNITY): Payer: Self-pay

## 2020-04-28 ENCOUNTER — Encounter (HOSPITAL_BASED_OUTPATIENT_CLINIC_OR_DEPARTMENT_OTHER): Payer: Self-pay | Admitting: *Deleted

## 2020-04-28 ENCOUNTER — Other Ambulatory Visit: Payer: Self-pay

## 2020-04-28 ENCOUNTER — Emergency Department (HOSPITAL_BASED_OUTPATIENT_CLINIC_OR_DEPARTMENT_OTHER)
Admission: EM | Admit: 2020-04-28 | Discharge: 2020-04-28 | Disposition: A | Discharge status: Home / Self Care | Payer: Medicaid Other | Attending: Emergency Medicine | Admitting: Emergency Medicine

## 2020-04-28 DIAGNOSIS — Z5321 Procedure and treatment not carried out due to patient leaving prior to being seen by health care provider: Secondary | ICD-10-CM | POA: Insufficient documentation

## 2020-04-28 DIAGNOSIS — Z4800 Encounter for change or removal of nonsurgical wound dressing: Secondary | ICD-10-CM | POA: Insufficient documentation

## 2020-04-28 DIAGNOSIS — Z8616 Personal history of COVID-19: Secondary | ICD-10-CM | POA: Insufficient documentation

## 2020-04-28 NOTE — ED Triage Notes (Addendum)
Pt reports swelling to nail of left ring finger x 1 month. 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 Recently it has been draining pus. Pt tested + for covid 2 days ago

## 2020-05-07 ENCOUNTER — Encounter (HOSPITAL_BASED_OUTPATIENT_CLINIC_OR_DEPARTMENT_OTHER): Payer: Self-pay | Admitting: Emergency Medicine

## 2020-05-07 ENCOUNTER — Other Ambulatory Visit: Payer: Self-pay

## 2020-05-07 ENCOUNTER — Emergency Department (HOSPITAL_BASED_OUTPATIENT_CLINIC_OR_DEPARTMENT_OTHER)
Admission: EM | Admit: 2020-05-07 | Discharge: 2020-05-07 | Disposition: A | Discharge status: Home / Self Care | Payer: Medicaid Other | Attending: Emergency Medicine | Admitting: Emergency Medicine

## 2020-05-07 DIAGNOSIS — Z5321 Procedure and treatment not carried out due to patient leaving prior to being seen by health care provider: Secondary | ICD-10-CM | POA: Insufficient documentation

## 2020-05-07 DIAGNOSIS — Z20822 Contact with and (suspected) exposure to covid-19: Secondary | ICD-10-CM | POA: Insufficient documentation

## 2020-05-07 LAB — RESPIRATORY PANEL BY RT PCR (FLU A&B, COVID)
Influenza A by PCR: NEGATIVE
Influenza B by PCR: NEGATIVE
SARS Coronavirus 2 by RT PCR: NEGATIVE

## 2020-05-07 NOTE — ED Triage Notes (Signed)
Needs a COVID test to return to work

## 2020-06-01 ENCOUNTER — Other Ambulatory Visit: Payer: Self-pay

## 2020-06-01 ENCOUNTER — Ambulatory Visit (HOSPITAL_COMMUNITY)
Admission: EM | Admit: 2020-06-01 | Discharge: 2020-06-02 | Disposition: A | Discharge status: Home / Self Care | Payer: No Payment, Other | Attending: Family | Admitting: Family

## 2020-06-01 ENCOUNTER — Encounter (HOSPITAL_COMMUNITY): Payer: Self-pay

## 2020-06-01 DIAGNOSIS — F1994 Other psychoactive substance use, unspecified with psychoactive substance-induced mood disorder: Secondary | ICD-10-CM

## 2020-06-01 DIAGNOSIS — F319 Bipolar disorder, unspecified: Secondary | ICD-10-CM | POA: Insufficient documentation

## 2020-06-01 DIAGNOSIS — Z8616 Personal history of COVID-19: Secondary | ICD-10-CM | POA: Diagnosis not present

## 2020-06-01 DIAGNOSIS — F1420 Cocaine dependence, uncomplicated: Secondary | ICD-10-CM | POA: Diagnosis not present

## 2020-06-01 LAB — COMPREHENSIVE METABOLIC PANEL
ALT: 13 U/L (ref 0–44)
AST: 12 U/L — ABNORMAL LOW (ref 15–41)
Albumin: 3.5 g/dL (ref 3.5–5.0)
Alkaline Phosphatase: 60 U/L (ref 38–126)
Anion gap: 12 (ref 5–15)
BUN: 9 mg/dL (ref 6–20)
CO2: 20 mmol/L — ABNORMAL LOW (ref 22–32)
Calcium: 9.2 mg/dL (ref 8.9–10.3)
Chloride: 106 mmol/L (ref 98–111)
Creatinine, Ser: 1.05 mg/dL — ABNORMAL HIGH (ref 0.44–1.00)
GFR, Estimated: >60 mL/min (ref >60)
Glucose, Bld: 112 mg/dL — ABNORMAL HIGH (ref 70–99)
Potassium: 3.4 mmol/L — ABNORMAL LOW (ref 3.5–5.1)
Sodium: 138 mmol/L (ref 135–145)
Total Bilirubin: 0.8 mg/dL (ref 0.3–1.2)
Total Protein: 6.3 g/dL — ABNORMAL LOW (ref 6.5–8.1)

## 2020-06-01 LAB — POCT PREGNANCY, URINE: Preg Test, Ur: NEGATIVE

## 2020-06-01 LAB — RESPIRATORY PANEL BY RT PCR (FLU A&B, COVID)
Influenza A by PCR: NEGATIVE
Influenza B by PCR: NEGATIVE
SARS Coronavirus 2 by RT PCR: NEGATIVE

## 2020-06-01 LAB — ETHANOL: Alcohol, Ethyl (B): <10 mg/dL (ref <10)

## 2020-06-01 LAB — CBC WITH DIFFERENTIAL/PLATELET
Abs Immature Granulocytes: 0 10*3/uL (ref 0.00–0.07)
Basophils Absolute: 0.1 10*3/uL (ref 0.0–0.1)
Basophils Relative: 1 %
Eosinophils Absolute: 0.1 10*3/uL (ref 0.0–0.5)
Eosinophils Relative: 1 %
HCT: 43.7 % (ref 36.0–46.0)
Hemoglobin: 15 g/dL (ref 12.0–15.0)
Lymphocytes Relative: 30 %
Lymphs Abs: 1.9 10*3/uL (ref 0.7–4.0)
MCH: 28.1 pg (ref 26.0–34.0)
MCHC: 34.3 g/dL (ref 30.0–36.0)
MCV: 82 fL (ref 80.0–100.0)
Monocytes Absolute: 0.3 10*3/uL (ref 0.1–1.0)
Monocytes Relative: 5 %
Neutro Abs: 4 10*3/uL (ref 1.7–7.7)
Neutrophils Relative %: 63 %
Platelets: 309 10*3/uL (ref 150–400)
RBC: 5.33 MIL/uL — ABNORMAL HIGH (ref 3.87–5.11)
RDW: 14 % (ref 11.5–15.5)
WBC: 6.4 10*3/uL (ref 4.0–10.5)
nRBC: 0 % (ref 0.0–0.2)
nRBC: 0 /100 WBC

## 2020-06-01 LAB — LIPID PANEL
Cholesterol: 166 mg/dL (ref 0–200)
HDL: 31 mg/dL — ABNORMAL LOW (ref >40)
LDL Cholesterol: 118 mg/dL — ABNORMAL HIGH (ref 0–99)
Total CHOL/HDL Ratio: 5.4 RATIO
Triglycerides: 83 mg/dL (ref <150)
VLDL: 17 mg/dL (ref 0–40)

## 2020-06-01 LAB — POCT URINE DRUG SCREEN - MANUAL ENTRY (I-SCREEN)
POC Amphetamine UR: POSITIVE — AB
POC Buprenorphine (BUP): NOT DETECTED
POC Cocaine UR: POSITIVE — AB
POC Marijuana UR: POSITIVE — AB
POC Methadone UR: NOT DETECTED
POC Methamphetamine UR: POSITIVE — AB
POC Morphine: NOT DETECTED
POC Oxazepam (BZO): NOT DETECTED
POC Oxycodone UR: NOT DETECTED
POC Secobarbital (BAR): NOT DETECTED

## 2020-06-01 LAB — POC SARS CORONAVIRUS 2 AG -  ED: SARS Coronavirus 2 Ag: NEGATIVE

## 2020-06-01 LAB — POC SARS CORONAVIRUS 2 AG: SARS Coronavirus 2 Ag: NEGATIVE

## 2020-06-01 LAB — HEMOGLOBIN A1C
Hgb A1c MFr Bld: 4.8 % (ref 4.8–5.6)
Mean Plasma Glucose: 91.06 mg/dL

## 2020-06-01 LAB — TSH: TSH: 0.934 u[IU]/mL (ref 0.350–4.500)

## 2020-06-01 LAB — MAGNESIUM: Magnesium: 2 mg/dL (ref 1.7–2.4)

## 2020-06-01 MED ORDER — NICOTINE 21 MG/24HR TD PT24
21.0000 mg | MEDICATED_PATCH | Freq: Every day | TRANSDERMAL | Status: DC
  Administered 2020-06-01 – 2020-06-02 (×2): 21 mg via TRANSDERMAL
  Filled 2020-06-01 (×2): qty 1

## 2020-06-01 MED ORDER — ACETAMINOPHEN 325 MG PO TABS: 650.0000 mg | ORAL_TABLET | Freq: Four times a day (QID) | ORAL | Status: DC | PRN

## 2020-06-01 MED ORDER — TRAZODONE HCL 50 MG PO TABS
50.0000 mg | ORAL_TABLET | Freq: Every evening | ORAL | Status: DC | PRN
  Administered 2020-06-01: 50 mg via ORAL
  Filled 2020-06-01: qty 1

## 2020-06-01 MED ORDER — CARIPRAZINE HCL 3 MG PO CAPS
6.0000 mg | ORAL_CAPSULE | Freq: Every morning | ORAL | Status: DC
  Filled 2020-06-01 (×3): qty 2

## 2020-06-01 MED ORDER — ALUM & MAG HYDROXIDE-SIMETH 200-200-20 MG/5ML PO SUSP: 30.0000 mL | ORAL | Status: DC | PRN

## 2020-06-01 MED ORDER — MAGNESIUM HYDROXIDE 400 MG/5ML PO SUSP: 30.0000 mL | Freq: Every day | ORAL | Status: DC | PRN

## 2020-06-01 MED ORDER — HYDROXYZINE HCL 25 MG PO TABS
25.0000 mg | ORAL_TABLET | Freq: Three times a day (TID) | ORAL | Status: DC | PRN
  Administered 2020-06-01 – 2020-06-02 (×3): 25 mg via ORAL
  Filled 2020-06-01 (×3): qty 1
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
## 2020-06-01 NOTE — ED Notes (Signed)
Blood drawn via venipuncture using 23g needle in left arm ac. No issues noted. Pressure dressing applied. Pt tolerated well. Dash called for STAT lab pickup

## 2020-06-01 NOTE — ED Notes (Signed)
Pt sleeping at present, no distress noted, monitoring for safety. 

## 2020-06-01 NOTE — ED Triage Notes (Signed)
34 yo female walk-in requesting to be sent to [facility] rehab center in [place] for cocaine detox. Pt states, "If I can't get cocaine, then I'd rather die. I last used 2 hrs ago. I spent $50 to get it. I can't manage my home and my habit. So, I need help. Denies SI/HI/AVH.

## 2020-06-01 NOTE — ED Notes (Signed)
Pt ambulated per self on the unit. Oriented to unit and staff. A&Ox4. Calm and cooperative. Denies SI, HI, AVH. States wanting help with substance use problem. Will continue to monitor for safety

## 2020-06-01 NOTE — ED Notes (Signed)
Patient arrived on floor. Patient items were placed in locker.  Patient given meal.

## 2020-06-01 NOTE — ED Notes (Signed)
Administered med per MD orders for stated anxiety. Will continue to monitor for safety

## 2020-06-01 NOTE — ED Notes (Signed)
Pt sleeping at present, no distress noted, calm & cooperative.  Monitoring for safety.  Denies SI at present.

## 2020-06-01 NOTE — ED Provider Notes (Signed)
Behavioral Health Admission H&P [facility])  Date: 06/01/20 Patient Name: Cynthia Odonnell MRN: [id number] Chief Complaint:  Chief Complaint  Patient presents with  . Addiction Problem  . Depression      Diagnoses:  Final diagnoses:  Substance induced mood disorder (HCC)  Cocaine use disorder, severe, dependence (HCC)    HPI: Patient presents voluntarily to [facility] behavioral [facility] center states "I need to get some help."  Patient reports she began smoking crack cocaine approximately 1 month ago after 17 years of snorting cocaine.  Patient reports she experiences intense withdrawal when she does not smoke crack cocaine and would like help with the substance use disorder.  Patient reports she made phone calls today and there was not an available placement for her so she was directed here.  Patient reports passive suicidal ideation states "I would be suicidal if I did not get some help, I would be better off dead if I do not get some help."  Patient states "I do not want to kill myself and I do not have a plan to kill myself but if I had a plan I guess I would use a gun at the house."  Contracts verbally for safety with this Clinical research associate.  Patient states "I can keep myself safe."  Patient reports she does not want to hurt herself.  Patient does not report any history of suicide attempts.  Patient resides in [place] with her husband, mother, grandfather, and 2 children ages 29 and 51.  Patient reports that her children are safe at home with her mother and her husband.  Patient reports there are weapons in her home that are owned by her husband.  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  Patient endorses use of cocaine daily times many years, last use approximately 2 hours prior to arrival.  Patient also endorses use of marijuana daily.  Patient endorses alcohol use occasionally approximately 2 times per month.  On  evaluation patient is alert and oriented, pleasant cooperative during assessment.  Patient displays actively in assessment, answers appropriately.  Patient denies homicidal ideations.  Patient denies auditory and visual hallucinations currently.  Patient does not appear to be responding to internal stimuli and there is no evidence of delusional thought content.  Patient denies symptoms of paranoia.  Patient is treated for bipolar affective disorder outpatient at [facility] by Dr. Evelene Croon.  Patient reports compliance with Vraylar 6 mg daily states this medication has decreased symptoms of auditory hallucinations.  Patient reports she did not take her Vraylar prior to arrival request this medication be given now.  Patient offered support and encouragement.  PHQ 2-9:     Admission (Discharged) from OP Visit from 04/25/2019 in BE[facility]IORAL [facility]ALTH OBSERVATION UNIT Admission (Discharged) from OP Visit from 04/15/[facility] Admission (Discharged) from 03/05/2018 in [facility]B  C-SSRS RISK CATEGORY Error: Q3, 4, or 5 should not be populated when Q2 is No Low Risk High Risk       Total Time spent with patient: 20 minutes  Musculoskeletal  Strength & Muscle Tone: within normal limits Gait & Station: normal Patient leans: N/A  Psychiatric Specialty Exam  Presentation General Appearance: Appropriate for Environment  Eye Contact:Good  Speech:Clear and Coherent;Normal Rate  Speech Volume:Normal  Handedness:Right   Mood and Affect  Mood:Depressed  Affect:Depressed;Congruent   Thought Process  Thought  Processes:Coherent;Goal Directed  Descriptions of Associations:Intact  Orientation:Full (Time, Place and Person)  Thought Content:Logical;WDL  Hallucinations:Hallucinations: None  Ideas of Reference:None  Suicidal Thoughts:Suicidal Thoughts: Yes, Passive SI Passive Intent and/or Plan:  Without Intent;With Plan  Homicidal Thoughts:Homicidal Thoughts: No   Sensorium  Memory:Immediate Good;Recent Good;Remote Good  Judgment:Fair  Insight:Fair   Executive Functions  Concentration:Good  Attention Span:Good  Recall:Good  Fund of Knowledge:Good  Language:Good   Psychomotor Activity  Psychomotor Activity:Psychomotor Activity: Normal   Assets  Assets:Communication Skills;Desire for Improvement;Financial Resources/Insurance;Housing;Intimacy;Physical Health;Resilience;Social Support   Sleep  Sleep:Sleep: Good Number of Hours of Sleep: 8   Physical Exam Vitals and nursing note reviewed.  Constitutional:      Appearance: She is well-developed.  HENT:     Head: Normocephalic.  Cardiovascular:     Rate and Rhythm: Normal rate.  Pulmonary:     Effort: Pulmonary effort is normal.  Neurological:     Mental Status: She is alert and oriented to person, place, and time.  Psychiatric:        Attention and Perception: Attention and perception normal.        Mood and Affect: Affect normal. Mood is depressed.        Speech: Speech normal.        Behavior: Behavior normal. Behavior is cooperative.        Thought Content: Thought content includes suicidal ideation.        Cognition and Memory: Cognition and memory normal.        Judgment: Judgment normal.    Review of Systems  Constitutional: Negative.   HENT: Negative.   Eyes: Negative.   Respiratory: Negative.   Cardiovascular: Negative.   Gastrointestinal: Negative.   Genitourinary: Negative.   Musculoskeletal: Negative.   Skin: Negative.   Neurological: Negative.   Endo/Heme/Allergies: Negative.   Psychiatric/Behavioral: Positive for substance abuse and suicidal ideas.    Blood pressure (!) 140/98, pulse (!) 106, temperature 97.7 F (36.5 C), temperature source Oral, resp. rate 18, height 5\' 6"  (1.676 m), weight 227 lb (103 kg), SpO2 99 %. Body mass index is 36.64 kg/m.  Past Psychiatric  History: Bipolar affective disorder, cocaine abuse, substance-induced mood disorder  Is the patient at risk to self? No  Has the patient been a risk to self in the past 6 months? No .    Has the patient been a risk to self within the distant past? No   Is the patient a risk to others? No   Has the patient been a risk to others in the past 6 months? No   Has the patient been a risk to others within the distant past? No   Past Medical History:  Past Medical History:  Diagnosis Date  . Abnormal Pap smear of cervix   . Anxiety   . Arthritis   . Bronchitis    Hx - uses albuterol inhaler prn  . Cocaine abuse, in remission (HCC)   . Depression   . GERD (gastroesophageal reflux disease)    zantac prn  . Headache(784.0)    otc prn  . Optic neuritis    left eye  . Smoker   . Termination of pregnancy (fetus)    x 2    Past Surgical History:  Procedure Laterality Date  . CESAREAN SECTION  07/31/2008   [facility]  . CESAREAN SECTION N/A 11/04/2014   Procedure: CESAREAN SECTION;  Surgeon: 01/04/2015, MD;  Location: [facility] ORS;  Service: Obstetrics;  Laterality: N/A;  . COLPOSCOPY    .  WISDOM TOOTH EXTRACTION      Family History:  Family History  Problem Relation Age of Onset  . Other Neg Hx     Social History:  Social History   Socioeconomic History  . Marital status: Married    Spouse name: Not on file  . Number of children: Not on file  . Years of education: Not on file  . Highest education level: Not on file  Occupational History  . Not on file  Tobacco Use  . Smoking status: Current Every Day Smoker    Packs/day: 1.00    Years: 16.00    Pack years: 16.00    Types: Cigarettes  . Smokeless tobacco: Never Used  Vaping Use  . Vaping Use: Never used  Substance and Sexual Activity  . Alcohol use: Yes    Comment: Beer: Tonight   . Drug use: Yes    Frequency: 3.0 times per week    Types: Cocaine, Methamphetamines    Comment: Last used: yesterday   . Sexual activity: Yes     Birth control/protection: Implant    Comment: implant "old"  Other Topics Concern  . Not on file  Social History Narrative  . Not on file   Social Determinants of Health   Financial Resource Strain:   . Difficulty of Paying Living Expenses: Not on file  Food Insecurity:   . Worried About Programme researcher, broadcasting/film/video in the Last Year: Not on file  . Ran Out of Food in the Last Year: Not on file  Transportation Needs:   . Lack of Transportation (Medical): Not on file  . Lack of Transportation (Non-Medical): Not on file  Physical Activity:   . Days of Exercise per Week: Not on file  . Minutes of Exercise per Session: Not on file  Stress:   . Feeling of Stress : Not on file  Social Connections:   . Frequency of Communication with Friends and Family: Not on file  . Frequency of Social Gatherings with Friends and Family: Not on file  . Attends Religious Services: Not on file  . Active Member of Clubs or Organizations: Not on file  . Attends Banker Meetings: Not on file  . Marital Status: Not on file  Intimate Partner Violence:   . Fear of Current or Ex-Partner: Not on file  . Emotionally Abused: Not on file  . Physically Abused: Not on file  . Sexually Abused: Not on file    SDOH:  SDOH Screenings   Alcohol Screen:   . Last Alcohol Screening Score (AUDIT): Not on file  Depression (PHQ2-9):   . PHQ-2 Score: Not on file  Financial Resource Strain:   . Difficulty of Paying Living Expenses: Not on file  Food Insecurity:   . Worried About Programme researcher, broadcasting/film/video in the Last Year: Not on file  . Ran Out of Food in the Last Year: Not on file  Housing:   . Last Housing Risk Score: Not on file  Physical Activity:   . Days of Exercise per Week: Not on file  . Minutes of Exercise per Session: Not on file  Social Connections:   . Frequency of Communication with Friends and Family: Not on file  . Frequency of Social Gatherings with Friends and Family: Not on file  . Attends  Religious Services: Not on file  . Active Member of Clubs or Organizations: Not on file  . Attends Banker Meetings: Not on file  . Marital  Status: Not on file  Stress:   . Feeling of Stress : Not on file  Tobacco Use: High Risk  . Smoking Tobacco Use: Current Every Day Smoker  . Smokeless Tobacco Use: Never Used  Transportation Needs:   . Freight forwarder (Medical): Not on file  . Lack of Transportation (Non-Medical): Not on file    Last Labs:  Admission on 05/07/2020, Discharged on 05/07/2020  Component Date Value Ref Range Status  . SARS Coronavirus 2 by RT PCR 05/07/2020 NEGATIVE  NEGATIVE Final   Comment: (NOTE) SARS-CoV-2 target nucleic acids are NOT DETECTED.  The SARS-CoV-2 RNA is generally detectable in upper respiratoy specimens during the acute phase of infection. The lowest concentration of SARS-CoV-2 viral copies this assay can detect is 131 copies/mL. A negative result does not preclude SARS-Cov-2 infection and should not be used as the sole basis for treatment or other patient management decisions. A negative result may occur with  improper specimen collection/handling, submission of specimen other than nasopharyngeal swab, presence of viral mutation(s) within the areas targeted by this assay, and inadequate number of viral copies (<131 copies/mL). A negative result must be combined with clinical observations, patient history, and epidemiological information. The expected result is Negative.  Fact Sheet for Patients:  https://www.moore.com/  Fact Sheet for Healthcare Providers:  https://www.young.biz/  This test is no                          t yet approved or cleared by the [place] FDA and  has been authorized for detection and/or diagnosis of SARS-CoV-2 by FDA under an Emergency Use Authorization (EUA). This EUA will remain  in effect (meaning this test can be used) for the duration of  the COVID-19 declaration under Section 564(b)(1) of the Act, 21 U.S.C. section 360bbb-3(b)(1), unless the authorization is terminated or revoked sooner.    . Influenza A by PCR 05/07/2020 NEGATIVE  NEGATIVE Final  . Influenza B by PCR 05/07/2020 NEGATIVE  NEGATIVE Final   Comment: (NOTE) The Xpert Xpress SARS-CoV-2/FLU/RSV assay is intended as an aid in  the diagnosis of influenza from Nasopharyngeal swab specimens and  should not be used as a sole basis for treatment. Nasal washings and  aspirates are unacceptable for Xpert Xpress SARS-CoV-2/FLU/RSV  testing.  Fact Sheet for Patients: https://www.moore.com/  Fact Sheet for Healthcare Providers: https://www.young.biz/  This test is not yet approved or cleared by the [place] FDA and  has been authorized for detection and/or diagnosis of SARS-CoV-2 by  FDA under an Emergency Use Authorization (EUA). This EUA will remain  in effect (meaning this test can be used) for the duration of the  Covid-19 declaration under Section 564(b)(1) of the Act, 21  U.S.C. section 360bbb-3(b)(1), unless the authorization is  terminated or revoked. Performed at [facility], [address] [place], [place] [address]   Admission on 04/26/2020, Discharged on 04/26/2020  Component Date Value Ref Range Status  . SARS Coronavirus 2 by RT PCR 04/26/2020 POSITIVE* NEGATIVE Final   Comment: RESULT CALLED TO, READ BACK BY AND VERIFIED WITH: CALLED TO M.SIMMS RN AT 1418 BY SROY ON [id number] (NOTE) SARS-CoV-2 target nucleic acids are DETECTED.  SARS-CoV-2 RNA is generally detectable in upper respiratory specimens  during the acute phase of infection. Positive results are indicative of the presence of the identified virus, but do not rule out bacterial infection or co-infection with other pathogens not detected by the test.  Clinical correlation with patient history and other diagnostic information is  necessary to determine patient infection status. The expected result is Negative.  Fact Sheet for Patients:  https://www.moore.com/https://www.fda.gov/media/142436/download  Fact Sheet for Healthcare Providers: https://www.young.biz/https://www.fda.gov/media/142435/download  This test is not yet approved or cleared by the [place] FDA and  has been authorized for detection and/or diagnosis of SARS-CoV-2 by FDA under an Emergency Use Authorization (EUA).  This EUA will remain in effect (meaning this                           test can be used) for the duration of  the COVID-19 declaration under Section 564(b)(1) of the Act, 21 U.S.C. section 360bbb-3(b)(1), unless the authorization is terminated or revoked sooner.     . Influenza A by PCR 04/26/2020 NEGATIVE  NEGATIVE Final  . Influenza B by PCR 04/26/2020 NEGATIVE  NEGATIVE Final   Comment: (NOTE) The Xpert Xpress SARS-CoV-2/FLU/RSV assay is intended as an aid in  the diagnosis of influenza from Nasopharyngeal swab specimens and  should not be used as a sole basis for treatment. Nasal washings and  aspirates are unacceptable for Xpert Xpress SARS-CoV-2/FLU/RSV  testing.  Fact Sheet for Patients: https://www.moore.com/https://www.fda.gov/media/142436/download  Fact Sheet for Healthcare Providers: https://www.young.biz/https://www.fda.gov/media/142435/download  This test is not yet approved or cleared by the [place] FDA and  has been authorized for detection and/or diagnosis of SARS-CoV-2 by  FDA under an Emergency Use Authorization (EUA). This EUA will remain  in effect (meaning this test can be used) for the duration of the  Covid-19 declaration under Section 564(b)(1) of the Act, 21  U.S.C. section 360bbb-3(b)(1), unless the authorization is  terminated or revoked. Performed at [facility], [address] [place], [place] [address]     Allergies: Amoxicillin, Codeine, Latex, Penicillins, Prednisone, Augmentin [amoxicillin-pot clavulanate], Lamictal [lamotrigine], and Tramadol  PTA  Medications: (Not in a hospital admission)   Medical Decision Making  Patient reviewed with Dr. Nelly RoutArchana Kumar.  Discussed starting home medication: Vraylar 6 mg daily  Also discussed plan to observe overnight in continuous assessment area, patient in agreement with plan. Discussed as needed medications including trazodone and Vistaril, discussed risk versus benefits as well as side effects, patient verbalizes understanding.    Recommendations  Based on my evaluation the patient does not appear to have an emergency medical condition.   [facility] and stabilization.  Patient will be reevaluated on 06/02/2020, the treatment team will determine disposition at that time.  Patrcia Dollyina L Tate, FNP 06/01/20  4:54 PM

## 2020-06-02 LAB — PROLACTIN: Prolactin: 19.5 ng/mL (ref 4.8–23.3)

## 2020-06-02 MED ORDER — CARIPRAZINE HCL 3 MG PO CAPS
6.0000 mg | ORAL_CAPSULE | Freq: Every day | ORAL | Status: DC
  Filled 2020-06-02: qty 14

## 2020-06-02 MED ORDER — CARIPRAZINE HCL 6 MG PO CAPS: 6.0000 mg | ORAL_CAPSULE | Freq: Every day | ORAL | 0 refills | Status: DC

## 2020-06-02 MED ORDER — CARIPRAZINE HCL 1.5 MG PO CAPS
6.0000 mg | ORAL_CAPSULE | Freq: Every day | ORAL | Status: DC
  Administered 2020-06-02: 6 mg via ORAL
  Filled 2020-06-02: qty 4

## 2020-06-02 NOTE — ED Notes (Signed)
Pt sleeping in no acute distress. RR even and nonlabored. Safety maintained. 

## 2020-06-02 NOTE — Discharge Instructions (Addendum)

## 2020-06-02 NOTE — Progress Notes (Signed)
CSW spoke with Olegario Messier at [facility] ([place]) who reports that they are not in fact, accepting/denying patients prior to their arrival to their facility. She confirmed that the process remains as follows: discharged patients may come to the urgent care walk in clinic at [facility] for an assessment and will be reviewed by the physician on staff for POSSIBLE admission to the detox/crisis center unit. She took the name and problem list of this pt and reports that there are a few female beds available at this time. Olegario Messier shared that pt may come via [facility] for an assessment and possible admission. Reola Calkins NP also took part in this conversation and is aware of the process moving forward.   Kasidi Shanker S. Alan Ripper, MSW, LCSW Clinical Social Worker 06/02/2020 12:59 PM

## 2020-06-02 NOTE — ED Notes (Addendum)
Pt discharged in no acute distress. Verbalized understanding of AVS instructions reviewed by Clinical research associate. All belongings returned to pt intact from locker #25. Pt received pharmacy samples at discharge. Pt escorted to lobby for discharge. Safety maintained.

## 2020-06-02 NOTE — ED Notes (Signed)
Pt sleeping at present, no distress noted.  Monitoring for safety. 

## 2020-06-02 NOTE — ED Notes (Signed)
Prn administered per MD orders for pt stated anxiety

## 2020-06-02 NOTE — ED Provider Notes (Signed)
FBC/OBS ASAP Discharge Summary  Date and Time: 06/02/2020 1:01 PM  Name: Cynthia Odonnell  MRN:  [id number]   Discharge Diagnoses:  Final diagnoses:  Substance induced mood disorder (HCC)  Cocaine use disorder, severe, dependence (HCC)    Subjective: Patient reports this morning that she is doing okay.  She states she still wants to go to a detox facility and was hoping to [facility] [place].  Patient denies any suicidal homicidal ideations and denies any hallucinations.  Patient reports having withdrawal symptoms and when asked to specify her symptoms she is experiencing she states "I am cold."  Patient is informed that to go to [facility] in [place] that there is no guarantee been there and she stated understanding and agreement to going there to be assessed.  She is informed that she does not meet treatment criteria for inpatient psychiatric bed.  Stay Summary: Patient is a 34 year old female who presented to the [facility] voluntarily as a walk-in requesting detox due to relapse on cocaine.  She will be suicidal if she did not get some help.  Patient also is seen by Dr. Evelene Croon at the [facility] seeing as prescribed Vraylar 6 mg p.o. daily for bipolar disorder.  Patient reported being compliant with her medications.  Patient was admitted to the continuous observation unit and restarted on her home medications.  Today the patient reports that she is doing a little bit better but still wants to go to detox treatment.  She denies any suicidal or homicidal ideations and denies any hallucinations.  Patient reported withdrawal symptoms of being cold.  Patient does not present with any noticeable withdrawal symptoms.  Patient has been sleeping most of the day.  Patient reported last use of cocaine was yesterday prior to arriving here at the [facility].  [facility] [place] for [facility].  Social worker contacted the [facility] and they stated they did not review patient's prior to arrival and that she  today had 2 available beds for females and that the patient could come and be assessed but there is no guarantee bed.  Patient was made aware of this information and she stated understanding and agreement and requested continue with the process of going to [facility] in [place].  Patient was provided with 7-day samples of Vraylar as well as prescription for 30 days.  Patient will be transported to [facility] in [place] via safe transport.  Total Time spent with patient: 30 minutes  Past Psychiatric History: Bipolar disorder, cocaine abuse Past Medical History:  Past Medical History:  Diagnosis Date  . Abnormal Pap smear of cervix   . Anxiety   . Arthritis   . Bronchitis    Hx - uses albuterol inhaler prn  . Cocaine abuse, in remission (HCC)   . Depression   . GERD (gastroesophageal reflux disease)    zantac prn  . Headache(784.0)    otc prn  . Optic neuritis    left eye  . Smoker   . Termination of pregnancy (fetus)    x 2    Past Surgical History:  Procedure Laterality Date  . CESAREAN SECTION  07/31/2008   [facility]  . CESAREAN SECTION N/A 11/04/2014   Procedure: CESAREAN SECTION;  Surgeon: Tracey Harries, MD;  Location: [facility];  Service: Obstetrics;  Laterality: N/A;  . COLPOSCOPY    . WISDOM TOOTH EXTRACTION     Family History:  Family History  Problem Relation Age of Onset  .  Other Neg Hx    Family Psychiatric History: None reported Social History:  Social History   Substance and Sexual Activity  Alcohol Use Yes   Comment: Beer: Tonight      Social History   Substance and Sexual Activity  Drug Use Yes  . Frequency: 3.0 times per week  . Types: Cocaine, Methamphetamines   Comment: Last used: yesterday     Social History   Socioeconomic History  . Marital status: Married    Spouse name: Not on file  . Number of children: Not on file  . Years of education: Not on file  . Highest education level: Not on file  Occupational History  . Not on file  Tobacco Use  .  Smoking status: Current Every Day Smoker    Packs/day: 1.00    Years: 16.00    Pack years: 16.00    Types: Cigarettes  . Smokeless tobacco: Never Used  Vaping Use  . Vaping Use: Never used  Substance and Sexual Activity  . Alcohol use: Yes    Comment: Beer: Tonight   . Drug use: Yes    Frequency: 3.0 times per week    Types: Cocaine, Methamphetamines    Comment: Last used: yesterday   . Sexual activity: Yes    Birth control/protection: Implant    Comment: implant "old"  Other Topics Concern  . Not on file  Social History Narrative  . Not on file   Social Determinants of Health   Financial Resource Strain:   . Difficulty of Paying Living Expenses: Not on file  Food Insecurity:   . Worried About Programme researcher, broadcasting/film/video in the Last Year: Not on file  . Ran Out of Food in the Last Year: Not on file  Transportation Needs:   . Lack of Transportation (Medical): Not on file  . Lack of Transportation (Non-Medical): Not on file  Physical Activity:   . Days of Exercise per Week: Not on file  . Minutes of Exercise per Session: Not on file  Stress:   . Feeling of Stress : Not on file  Social Connections:   . Frequency of Communication with Friends and Family: Not on file  . Frequency of Social Gatherings with Friends and Family: Not on file  . Attends Religious Services: Not on file  . Active Member of Clubs or Organizations: Not on file  . Attends Banker Meetings: Not on file  . Marital Status: Not on file   SDOH:  SDOH Screenings   Alcohol Screen:   . Last Alcohol Screening Score (AUDIT): Not on file  Depression (PHQ2-9):   . PHQ-2 Score: Not on file  Financial Resource Strain:   . Difficulty of Paying Living Expenses: Not on file  Food Insecurity:   . Worried About Programme researcher, broadcasting/film/video in the Last Year: Not on file  . Ran Out of Food in the Last Year: Not on file  Housing:   . Last Housing Risk Score: Not on file  Physical Activity:   . Days of Exercise per  Week: Not on file  . Minutes of Exercise per Session: Not on file  Social Connections:   . Frequency of Communication with Friends and Family: Not on file  . Frequency of Social Gatherings with Friends and Family: Not on file  . Attends Religious Services: Not on file  . Active Member of Clubs or Organizations: Not on file  . Attends Banker Meetings: Not on file  .  Marital Status: Not on file  Stress:   . Feeling of Stress : Not on file  Tobacco Use: High Risk  . Smoking Tobacco Use: Current Every Day Smoker  . Smokeless Tobacco Use: Never Used  Transportation Needs:   . Freight forwarderLack of Transportation (Medical): Not on file  . Lack of Transportation (Non-Medical): Not on file    Has this patient used any form of tobacco in the last 30 days? (Cigarettes, Smokeless Tobacco, Cigars, and/or Pipes) A prescription for an FDA-approved tobacco cessation medication was offered at discharge and the patient refused  Current Medications:  Current Facility-Administered Medications  Medication Dose Route Frequency Provider Last Rate Last Admin  . acetaminophen (TYLENOL) tablet 650 mg  650 mg Oral Q6H PRN Patrcia Dollyate, Tina L, FNP      . alum & mag hydroxide-simeth (MAALOX/MYLANTA) 200-200-20 MG/5ML suspension 30 mL  30 mL Oral Q4H PRN Patrcia Dollyate, Tina L, FNP      . cariprazine (VRAYLAR) capsule 6 mg  6 mg Oral Daily Ronte Parker, Gerlene Burdockravis B, FNP   6 mg at 06/02/20 1112  . hydrOXYzine (ATARAX/VISTARIL) tablet 25 mg  25 mg Oral TID PRN Patrcia Dollyate, Tina L, FNP   25 mg at 06/02/20 1114  . magnesium hydroxide (MILK OF MAGNESIA) suspension 30 mL  30 mL Oral Daily PRN Patrcia Dollyate, Tina L, FNP      . nicotine (NICODERM CQ - dosed in mg/24 hours) patch 21 mg  21 mg Transdermal Daily Patrcia Dollyate, Tina L, FNP   21 mg at 06/02/20 1031  . traZODone (DESYREL) tablet 50 mg  50 mg Oral QHS PRN Patrcia Dollyate, Tina L, FNP   50 mg at 06/01/20 2324   Current Outpatient Medications  Medication Sig Dispense Refill  . albuterol (PROAIR HFA) 108 (90 Base) MCG/ACT  inhaler Inhale 2 puffs into the lungs every 6 (six) hours as needed.    Melene Muller. [START ON 06/03/2020] cariprazine 6 MG CAPS Take 1 capsule (6 mg total) by mouth daily. 30 capsule 0    PTA Medications: (Not in a hospital admission)   Musculoskeletal  Strength & Muscle Tone: within normal limits Gait & Station: normal Patient leans: N/A  Psychiatric Specialty Exam  Presentation  General Appearance: Appropriate for Environment;Casual;Disheveled  Eye Contact:Good  Speech:Clear and Coherent;Normal Rate  Speech Volume:Normal  Handedness:Right   Mood and Affect  Mood:Euthymic  Affect:Appropriate;Congruent   Thought Process  Thought Processes:Coherent  Descriptions of Associations:Intact  Orientation:Full (Time, Place and Person)  Thought Content:WDL  Hallucinations:Hallucinations: None  Ideas of Reference:None  Suicidal Thoughts:Suicidal Thoughts: No SI Passive Intent and/or Plan: Without Intent;With Plan  Homicidal Thoughts:Homicidal Thoughts: No   Sensorium  Memory:Immediate Good;Recent Good;Remote Good  Judgment:Fair  Insight:Fair   Executive Functions  Concentration:Good  Attention Span:Good  Recall:Good  Fund of Knowledge:Good  Language:Good   Psychomotor Activity  Psychomotor Activity:Psychomotor Activity: Normal   Assets  Assets:Communication Skills;Desire for Improvement   Sleep  Sleep:Sleep: Good Number of Hours of Sleep: 8   Physical Exam  Physical Exam Vitals and nursing note reviewed.  Constitutional:      Appearance: She is well-developed.  HENT:     Head: Normocephalic.  Eyes:     Pupils: Pupils are equal, round, and reactive to light.  Cardiovascular:     Rate and Rhythm: Normal rate.  Pulmonary:     Effort: Pulmonary effort is normal.  Musculoskeletal:        General: Normal range of motion.  Neurological:     Mental Status: She is alert  and oriented to person, place, and time.    Review of Systems   Constitutional: Negative.   HENT: Negative.   Eyes: Negative.   Respiratory: Negative.   Cardiovascular: Negative.   Gastrointestinal: Negative.   Genitourinary: Negative.   Musculoskeletal: Negative.   Skin: Negative.   Neurological: Negative.   Endo/Heme/Allergies: Negative.   Psychiatric/Behavioral: Positive for substance abuse.   Blood pressure (!) 132/94, pulse 79, temperature 97.8 F (36.6 C), temperature source Oral, resp. rate 18, height 5\' 6"  (1.676 m), weight 227 lb (103 kg), SpO2 99 %. Body mass index is 36.64 kg/m.  Demographic Factors:  Caucasian  Loss Factors: NA  Historical Factors: NA  Risk Reduction Factors:   Sense of responsibility to family, Employed, Living with another person, especially a relative, Positive social support and Positive therapeutic relationship  Continued Clinical Symptoms:  Alcohol/Substance Abuse/Dependencies Previous Psychiatric Diagnoses and Treatments  Cognitive Features That Contribute To Risk:  None    Suicide Risk:  Minimal: No identifiable suicidal ideation.  Patients presenting with no risk factors but with morbid ruminations; may be classified as minimal risk based on the severity of the depressive symptoms  Plan Of Care/Follow-up recommendations:  Continue activity as tolerated. Continue diet as recommended by your PCP. Ensure to keep all appointments with outpatient providers.  Disposition: Discharge to [facility] in [place] for detox  [place], FNP 06/02/2020, 1:01 PM

## 2020-06-02 NOTE — ED Notes (Signed)
Pt sitting up eating snack. Denies concerns at present. Informed pt to notify staff with any needs or concerns. Denies SI/HI. Safety maintained.

## 2020-06-02 NOTE — ED Notes (Signed)
Archivist for pt transport to [facility]. Was informed by driver to call back in 1 6/7-0LID as both drivers were out on other transports. Pt's mother notified by pt and informed pt that she would drive pt to facility. RN confirmed this info by speaking with Diane, pt's mother, who stated she would be at facility in 15 min to pick up pt and drive her to [facility] facility in [place]. NP notified.

## 2020-06-02 NOTE — ED Notes (Signed)
Patient refused vitals stating " They just got my vitals 30 minutes ago, you don't have to get them again".

## 2020-07-05 ENCOUNTER — Other Ambulatory Visit: Payer: Self-pay

## 2020-07-05 ENCOUNTER — Telehealth (INDEPENDENT_AMBULATORY_CARE_PROVIDER_SITE_OTHER): Payer: No Payment, Other | Admitting: Psychiatry

## 2020-07-05 ENCOUNTER — Encounter (HOSPITAL_COMMUNITY): Payer: Self-pay | Admitting: Psychiatry

## 2020-07-05 DIAGNOSIS — F319 Bipolar disorder, unspecified: Secondary | ICD-10-CM | POA: Diagnosis not present

## 2020-07-05 DIAGNOSIS — F1420 Cocaine dependence, uncomplicated: Secondary | ICD-10-CM

## 2020-07-05 MED ORDER — HYDROXYZINE HCL 50 MG PO TABS: 50.0000 mg | ORAL_TABLET | Freq: Three times a day (TID) | ORAL | 1 refills | Status: DC | PRN

## 2020-07-05 MED ORDER — BUPROPION HCL ER (XL) 150 MG PO TB24: 150.0000 mg | ORAL_TABLET | ORAL | 1 refills | Status: DC

## 2020-07-05 MED ORDER — CARIPRAZINE HCL 6 MG PO CAPS: 6.0000 mg | ORAL_CAPSULE | Freq: Every day | ORAL | 1 refills | Status: DC

## 2020-07-05 MED FILL — BUPROPION HCL XL 150 MG TAB: 150 | 30 days supply | Qty: 30 | Fill #0

## 2020-07-05 MED FILL — hydrOXYzine HCL 50 MG TABS: 50 | 30 days supply | Qty: 90 | Fill #0

## 2020-07-05 MED FILL — VRAYLAR 6 MG CAPSULE: 6 | 30 days supply | Qty: 30 | Fill #0

## 2020-07-05 NOTE — Progress Notes (Signed)
BH OP Progress Note   Virtual Visit via Telephone Note  I connected with Cynthia Odonnell on 07/05/20 at 10:00 AM EST by telephone and verified that I am speaking with the correct person using two identifiers.  Location: Patient: home Provider: Clinic   I discussed the limitations, risks, security and privacy concerns of performing an evaluation and management service by telephone and the availability of in person appointments. I also discussed with the patient that there may be a patient responsible charge related to this service. The patient expressed understanding and agreed to proceed.   I provided 16 minutes of non-face-to-face time during this encounter.    Patient Identification: Cynthia Odonnell MRN:  [id number] Date of Evaluation:  07/05/2020    Chief Complaint:   " I am okay, I went to rehab."  Visit Diagnosis:    ICD-10-CM   1. Bipolar 1 disorder (HCC)  F31.9 buPROPion (WELLBUTRIN XL) 150 MG 24 hr tablet    Cariprazine HCl 6 MG CAPS    hydrOXYzine (ATARAX/VISTARIL) 50 MG tablet  2. Cocaine use disorder, severe, dependence (HCC)  F14.20     History of Present Illness: Patient seen for follow-up.  Patient stated that she was in the car going to work and therefore she preferred talking on the phone rather than using video communication.  She stated that she went to rehab for a week a few weeks ago however her mother passed away when she was there and therefore she had to come out of it prematurely.  She stated that she is in the process of getting her paperwork in order to go to another rehab place as she really wants to quit. Writer asked what happened to her mother she replied that she passed away due to an overdose. She informed that when she was in the rehab for a week she was prescribed Wellbutrin in the morning that helped her with the cravings and she found it to be helpful and she also reported that she was taking hydroxyzine 50 mg 3 times daily as needed for anxiety in  the past and that was helpful to him.  She asked if she can be prescribed to these medications. Writer asked her about her sleep, she replied that she has been taking her mother's clonazepam on some occasions but not every day.  She stated that she knows she should not be taking it, Clinical research associate reiterated that she should not be using a benzodiazepine when she is trying to go to rehab. She stated that she also uses melatonin which helps sometimes. She stated that she has Vraylar currently however the voucher that she was provided with did not help her get any free medication. Writer informed her of [facility] and the patient assistance program there that can help her get the medication for minimal cost.  Patient was agreeable to switch her pharmacy to this pharmacy. Potential side effects of medication and risks vs benefits of treatment vs non-treatment were explained and discussed. All questions were answered.   Past Psychiatric History: Bipolar d/o, Cocaine use d/o  Previous Psychotropic Medications: Yes   Substance Abuse History in the last 12 months:  Yes.    Consequences of Substance Abuse: Negative  Past Medical History:  Past Medical History:  Diagnosis Date  . Abnormal Pap smear of cervix   . Anxiety   . Arthritis   . Bronchitis    Hx - uses albuterol inhaler prn  . Cocaine abuse, in remission (  HCC)   . Depression   . GERD (gastroesophageal reflux disease)    zantac prn  . Headache(784.0)    otc prn  . Optic neuritis    left eye  . Smoker   . Termination of pregnancy (fetus)    x 2    Past Surgical History:  Procedure Laterality Date  . CESAREAN SECTION  07/31/2008   [facility]  . CESAREAN SECTION N/A 11/04/2014   Procedure: CESAREAN SECTION;  Surgeon: Tracey Harries, MD;  Location: [facility];  Service: Obstetrics;  Laterality: N/A;  . COLPOSCOPY    . WISDOM TOOTH EXTRACTION      Family Psychiatric History:  patient reports she is adopted. Was raised by  adoptive mother and adoptive grandfather. Reports biological mother has history of cocaine use disorder and suspects she may have Bipolar Disorder.  Family History:  Family History  Problem Relation Age of Onset  . Other Neg Hx     Social History:   Social History   Socioeconomic History  . Marital status: Married    Spouse name: Not on file  . Number of children: Not on file  . Years of education: Not on file  . Highest education level: Not on file  Occupational History  . Not on file  Tobacco Use  . Smoking status: Current Every Day Smoker    Packs/day: 1.00    Years: 16.00    Pack years: 16.00    Types: Cigarettes  . Smokeless tobacco: Never Used  Vaping Use  . Vaping Use: Never used  Substance and Sexual Activity  . Alcohol use: Yes    Comment: Beer: Tonight   . Drug use: Yes    Frequency: 3.0 times per week    Types: Cocaine, Methamphetamines    Comment: Last used: yesterday   . Sexual activity: Yes    Birth control/protection: Implant    Comment: implant "old"  Other Topics Concern  . Not on file  Social History Narrative  . Not on file   Social Determinants of Health   Financial Resource Strain:   . Difficulty of Paying Living Expenses: Not on file  Food Insecurity:   . Worried About Programme researcher, broadcasting/film/video in the Last Year: Not on file  . Ran Out of Food in the Last Year: Not on file  Transportation Needs:   . Lack of Transportation (Medical): Not on file  . Lack of Transportation (Non-Medical): Not on file  Physical Activity:   . Days of Exercise per Week: Not on file  . Minutes of Exercise per Session: Not on file  Stress:   . Feeling of Stress : Not on file  Social Connections:   . Frequency of Communication with Friends and Family: Not on file  . Frequency of Social Gatherings with Friends and Family: Not on file  . Attends Religious Services: Not on file  . Active Member of Clubs or Organizations: Not on file  . Attends Banker  Meetings: Not on file  . Marital Status: Not on file    Additional Social History: Works as a Production designer, theatre/television/film at [employer], has 2 children.  Allergies:   Allergies  Allergen Reactions  . Amoxicillin Hives  . Codeine Itching  . Latex Itching  . Penicillins Hives    Has patient had a PCN reaction causing immediate rash, facial/tongue/throat swelling, SOB or lightheadedness with hypotension: No Has patient had a PCN reaction causing severe rash involving mucus membranes or skin necrosis: No Has patient  had a PCN reaction that required hospitalization: No Has patient had a PCN reaction occurring within the last 10 years: No If all of the above answers are "NO", then may proceed with Cephalosporin use.  . Prednisone Itching  . Augmentin [Amoxicillin-Pot Clavulanate] Hives and Rash  . Lamictal [Lamotrigine] Rash  . Tramadol Itching and Rash    Metabolic Disorder Labs: Lab Results  Component Value Date   HGBA1C 4.8 06/01/2020   MPG 91.06 06/01/2020   MPG 97 04/17/2019   Lab Results  Component Value Date   PROLACTIN 19.5 06/01/2020   Lab Results  Component Value Date   CHOL 166 06/01/2020   TRIG 83 06/01/2020   HDL 31 (L) 06/01/2020   CHOLHDL 5.4 06/01/2020   VLDL 17 06/01/2020   LDLCALC 118 (H) 06/01/2020   LDLCALC 112 (H) 04/16/2019   Lab Results  Component Value Date   TSH 0.934 06/01/2020    Therapeutic Level Labs: No results found for: LITHIUM No results found for: CBMZ No results found for: VALPROATE  Current Medications: Current Outpatient Medications  Medication Sig Dispense Refill  . albuterol (PROAIR HFA) 108 (90 Base) MCG/ACT inhaler Inhale 2 puffs into the lungs every 6 (six) hours as needed.    Marland Kitchen buPROPion (WELLBUTRIN XL) 150 MG 24 hr tablet Take 1 tablet (150 mg total) by mouth every morning. 30 tablet 1  . Cariprazine HCl 6 MG CAPS Take 1 capsule (6 mg total) by mouth daily. 30 capsule 1  . hydrOXYzine (ATARAX/VISTARIL) 50 MG tablet Take 1 tablet (50 mg total)  by mouth 3 (three) times daily as needed for anxiety. 90 tablet 1   No current facility-administered medications for this visit.    Musculoskeletal: Strength & Muscle Tone: within normal limits Gait & Station: normal Patient leans: Backward  Psychiatric Specialty Exam: Review of Systems  There were no vitals taken for this visit.There is no height or weight on file to calculate BMI.  General Appearance: unable to assess due to phone visit  Eye Contact:  unable to assess due to phone visit  Speech:  Clear and Coherent and Normal Rate  Volume:  Normal  Mood:  Euthymic  Affect:  Congruent  Thought Process:  Goal Directed and Descriptions of Associations: Intact  Orientation:  Full (Time, Place, and Person)  Thought Content:  Logical  Suicidal Thoughts:  No  Homicidal Thoughts:  No  Memory:  Immediate;   Good Recent;   Good  Judgement:  Fair  Insight:  Fair  Psychomotor Activity:  Normal  Concentration:  Concentration: Good and Attention Span: Fair  Recall:  Good  Fund of Knowledge:Good  Language: Good  Akathisia:  Negative  Handed:  Right  AIMS (if indicated):  0  Assets:  Communication Skills Desire for Improvement Financial Resources/Insurance Housing Transportation Vocational/Educational  ADL's:  Intact  Cognition: WNL  Sleep:  Fair   Screenings: AIMS     Admission (Discharged) from OP Visit from [facility]/2019 [facility] Admission (Discharged) from [facility] Admission (Discharged) from [facility]  AIMS Total Score 0 0 0    AUDIT     Admission (Discharged) from OP Visit from 04/25/2019 [facility] Admission (Discharged) from OP Visit from [facility]  Alcohol Use Disorder Identification Test Final Score (AUDIT) 0 23    PHQ2-9     US OB  FOLLOW UP ADD'L GEST from  [facility]/2014 [facility] FOLLOW UP ADD'L GEST from 09/27/2014 [facility] OB FOLLOW UP ADD'L GEST from 09/06/2014 [facility]  PHQ-2 Total Score 0 0 0      Assessment and Plan: Patient seems to be doing better compared to her last visit.  She has had a brief stay at a rehab for a week which she had to cut short after her mother passed away secondary to a drug overdose.  She stated that she is in the process of working on going to another rehab currently.  She did ask for prescriptions for Wellbutrin and hydroxyzine which she was prescribed recently at her stay at the rehab place.  She was informed of [facility] to help with assistance to get her medications.  1. Bipolar 1 disorder (HCC)  - buPROPion (WELLBUTRIN XL) 150 MG 24 hr tablet; Take 1 tablet (150 mg total) by mouth every morning.  Dispense: 30 tablet; Refill: 1 - Cariprazine HCl 6 MG CAPS; Take 1 capsule (6 mg total) by mouth daily.  Dispense: 30 capsule; Refill: 1 - hydrOXYzine (ATARAX/VISTARIL) 50 MG tablet; Take 1 tablet (50 mg total) by mouth 3 (three) times daily as needed for anxiety.  Dispense: 90 tablet; Refill: 1  2. Cocaine use disorder, severe, dependence (HCC) - In the process of going to a different rehab facility.  F/up in 2 months.   Zena AmosMandeep Jamila Slatten, MD 12/2/202110:16 AM

## 2020-07-10 ENCOUNTER — Telehealth (HOSPITAL_COMMUNITY): Payer: Self-pay | Admitting: *Deleted

## 2020-07-10 NOTE — Telephone Encounter (Signed)
Prior auth denied re her Leafy Kindle because she has family planning medicaid only which doesn't cover this med. Will let her know this and see if she can consult with her case worker and make any needed changes to her policy.

## 2020-07-12 ENCOUNTER — Telehealth (HOSPITAL_COMMUNITY): Payer: Self-pay | Admitting: *Deleted

## 2020-07-12 NOTE — Telephone Encounter (Signed)
Second call to inform her Cynthia Kindle PA was denied because he has family planning medicaid and its not covering this type of med. Both times went to voice mail that says it is not set up to leave messages.

## 2020-08-30 ENCOUNTER — Telehealth (HOSPITAL_COMMUNITY): Payer: No Payment, Other | Admitting: Psychiatry

## 2020-09-27 ENCOUNTER — Telehealth (HOSPITAL_COMMUNITY): Payer: No Payment, Other | Admitting: Psychiatry

## 2020-09-27 ENCOUNTER — Other Ambulatory Visit: Payer: Self-pay

## 2020-12-24 ENCOUNTER — Ambulatory Visit: Payer: Self-pay | Admitting: Physician Assistant

## 2020-12-24 ENCOUNTER — Other Ambulatory Visit: Payer: Self-pay

## 2020-12-24 ENCOUNTER — Telehealth: Payer: Self-pay | Admitting: Physician Assistant

## 2020-12-24 VITALS — BP 119/75 | HR 74 | Temp 98.2°F | Resp 18 | Ht 66.0 in | Wt 213.0 lb

## 2020-12-24 DIAGNOSIS — Z114 Encounter for screening for human immunodeficiency virus [HIV]: Secondary | ICD-10-CM

## 2020-12-24 DIAGNOSIS — F319 Bipolar disorder, unspecified: Secondary | ICD-10-CM

## 2020-12-24 DIAGNOSIS — F411 Generalized anxiety disorder: Secondary | ICD-10-CM

## 2020-12-24 DIAGNOSIS — F1420 Cocaine dependence, uncomplicated: Secondary | ICD-10-CM

## 2020-12-24 DIAGNOSIS — Z13228 Encounter for screening for other metabolic disorders: Secondary | ICD-10-CM

## 2020-12-24 DIAGNOSIS — F5104 Psychophysiologic insomnia: Secondary | ICD-10-CM

## 2020-12-24 DIAGNOSIS — F250 Schizoaffective disorder, bipolar type: Secondary | ICD-10-CM

## 2020-12-24 DIAGNOSIS — Z1159 Encounter for screening for other viral diseases: Secondary | ICD-10-CM

## 2020-12-24 DIAGNOSIS — Z9189 Other specified personal risk factors, not elsewhere classified: Secondary | ICD-10-CM

## 2020-12-24 MED ORDER — HYDROXYZINE HCL 10 MG PO TABS: ORAL_TABLET | ORAL | 1 refills | Status: DC

## 2020-12-24 MED ORDER — BUPROPION HCL ER (XL) 150 MG PO TB24: 150.0000 mg | ORAL_TABLET | ORAL | 1 refills | Status: DC

## 2020-12-24 MED ORDER — HYDROXYZINE HCL 50 MG PO TABS: ORAL_TABLET | ORAL | 1 refills | Status: DC

## 2020-12-24 NOTE — Progress Notes (Signed)
Patient has eaten today and has eaten today. Patient denies pain at this time. Refill on Wellbutrin and Hydroxzine. Patient would like to discuss Ranar prescribing.

## 2020-12-24 NOTE — Telephone Encounter (Signed)
Rhonda Hilmer from [place] called stating the pt went to the mobile clinic to get refills on all of her medications and sent a sheet of her medications. Bjorn Loser would like a call back to discuss the medications that need to be refilled. Rhonda's cell [phone] or office [phone]

## 2020-12-24 NOTE — Telephone Encounter (Signed)
Cynthia Odonnell was stating not all medications that patient needed were refilled and she had questions about that.

## 2020-12-24 NOTE — Telephone Encounter (Signed)
Patients visit was completed today and refills have already been sent.

## 2020-12-24 NOTE — Patient Instructions (Signed)
To help you with anxiety, you can take hydroxyzine 10 to 20 mg at lunchtime.  To help you with your insomnia, you can take 25 to 50 mg of hydroxyzine along with your Seroquel.  I also refilled your Wellbutrin.  Please feel free to return to the mobile unit, we will be happy to help you manage your medications while you are waiting to be seen by behavioral health.  Roney Jaffe, PA-C Physician Assistant [facility] [email or web address]    Health Maintenance, Female Adopting a healthy lifestyle and getting preventive care are important in promoting health and wellness. Ask your health care provider about:  The right schedule for you to have regular tests and exams.  Things you can do on your own to prevent diseases and keep yourself healthy. What should I know about diet, weight, and exercise? Eat a healthy diet  Eat a diet that includes plenty of vegetables, fruits, low-fat dairy products, and lean protein.  Do not eat a lot of foods that are high in solid fats, added sugars, or sodium.   Maintain a healthy weight Body mass index (BMI) is used to identify weight problems. It estimates body fat based on height and weight. Your health care provider can help determine your BMI and help you achieve or maintain a healthy weight. Get regular exercise Get regular exercise. This is one of the most important things you can do for your health. Most adults should:  Exercise for at least 150 minutes each week. The exercise should increase your heart rate and make you sweat (moderate-intensity exercise).  Do strengthening exercises at least twice a week. This is in addition to the moderate-intensity exercise.  Spend less time sitting. Even light physical activity can be beneficial. Watch cholesterol and blood lipids Have your blood tested for lipids and cholesterol at 35 years of age, then have this test every 5 years. Have your  cholesterol levels checked more often if:  Your lipid or cholesterol levels are high.  You are older than 35 years of age.  You are at high risk for heart disease. What should I know about cancer screening? Depending on your health history and family history, you may need to have cancer screening at various ages. This may include screening for:  Breast cancer.  Cervical cancer.  Colorectal cancer.  Skin cancer.  Lung cancer. What should I know about heart disease, diabetes, and high blood pressure? Blood pressure and heart disease  High blood pressure causes heart disease and increases the risk of stroke. This is more likely to develop in people who have high blood pressure readings, are of African descent, or are overweight.  Have your blood pressure checked: ? Every 3-5 years if you are 33-70 years of age. ? Every year if you are 65 years old or older. Diabetes Have regular diabetes screenings. This checks your fasting blood sugar level. Have the screening done:  Once every three years after age 65 if you are at a normal weight and have a low risk for diabetes.  More often and at a younger age if you are overweight or have a high risk for diabetes. What should I know about preventing infection? Hepatitis B If you have a higher risk for hepatitis B, you should be screened for this virus. Talk with your health care provider to find out if you are at risk for hepatitis B infection. Hepatitis C Testing is recommended for:  Everyone born from [birth year] through 1965.  Anyone  with known risk factors for hepatitis C. Sexually transmitted infections (STIs)  Get screened for STIs, including gonorrhea and chlamydia, if: ? You are sexually active and are younger than 35 years of age. ? You are older than 35 years of age and your health care provider tells you that you are at risk for this type of infection. ? Your sexual activity has changed since you were last screened, and you are  at increased risk for chlamydia or gonorrhea. Ask your health care provider if you are at risk.  Ask your health care provider about whether you are at high risk for HIV. Your health care provider may recommend a prescription medicine to help prevent HIV infection. If you choose to take medicine to prevent HIV, you should first get tested for HIV. You should then be tested every 3 months for as long as you are taking the medicine. Pregnancy  If you are about to stop having your period (premenopausal) and you may become pregnant, seek counseling before you get pregnant.  Take 400 to 800 micrograms (mcg) of folic acid every day if you become pregnant.  Ask for birth control (contraception) if you want to prevent pregnancy. Osteoporosis and menopause Osteoporosis is a disease in which the bones lose minerals and strength with aging. This can result in bone fractures. If you are 74 years old or older, or if you are at risk for osteoporosis and fractures, ask your health care provider if you should:  Be screened for bone loss.  Take a calcium or vitamin D supplement to lower your risk of fractures.  Be given hormone replacement therapy (HRT) to treat symptoms of menopause. Follow these instructions at home: Lifestyle  Do not use any products that contain nicotine or tobacco, such as cigarettes, e-cigarettes, and chewing tobacco. If you need help quitting, ask your health care provider.  Do not use street drugs.  Do not share needles.  Ask your health care provider for help if you need support or information about quitting drugs. Alcohol use  Do not drink alcohol if: ? Your health care provider tells you not to drink. ? You are pregnant, may be pregnant, or are planning to become pregnant.  If you drink alcohol: ? Limit how much you use to 0-1 drink a day. ? Limit intake if you are breastfeeding.  Be aware of how much alcohol is in your drink. In the [place] one drink equals one 12 oz  bottle of beer (355 mL), one 5 oz glass of wine (148 mL), or one 1 oz glass of hard liquor (44 mL). General instructions  Schedule regular health, dental, and eye exams.  Stay current with your vaccines.  Tell your health care provider if: ? You often feel depressed. ? You have ever been abused or do not feel safe at home. Summary  Adopting a healthy lifestyle and getting preventive care are important in promoting health and wellness.  Follow your health care provider's instructions about healthy diet, exercising, and getting tested or screened for diseases.  Follow your health care provider's instructions on monitoring your cholesterol and blood pressure. This information is not intended to replace advice given to you by your health care provider. Make sure you discuss any questions you have with your health care provider. Document Revised: 07/14/2018 Document Reviewed: 07/14/2018 Elsevier Patient Education  2021 [employer].

## 2020-12-24 NOTE — Telephone Encounter (Signed)
Cynthia Odonnell calling back asking for a call back to discuss pt's medications. Pt runs out of her medications today. Please call!
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
## 2020-12-25 DIAGNOSIS — F250 Schizoaffective disorder, bipolar type: Secondary | ICD-10-CM | POA: Insufficient documentation

## 2020-12-25 DIAGNOSIS — F5104 Psychophysiologic insomnia: Secondary | ICD-10-CM | POA: Insufficient documentation

## 2020-12-25 DIAGNOSIS — F411 Generalized anxiety disorder: Secondary | ICD-10-CM | POA: Insufficient documentation

## 2020-12-25 LAB — COMP. METABOLIC PANEL (12)
AST: 18 IU/L (ref 0–40)
Albumin/Globulin Ratio: 1.8 (ref 1.2–2.2)
Albumin: 4.1 g/dL (ref 3.8–4.8)
Alkaline Phosphatase: 85 IU/L (ref 44–121)
BUN/Creatinine Ratio: 15 (ref 9–23)
BUN: 12 mg/dL (ref 6–20)
Bilirubin Total: 0.2 mg/dL (ref 0.0–1.2)
Calcium: 8.9 mg/dL (ref 8.7–10.2)
Chloride: 101 mmol/L (ref 96–106)
Creatinine, Ser: 0.78 mg/dL (ref 0.57–1.00)
Globulin, Total: 2.3 g/dL (ref 1.5–4.5)
Glucose: 92 mg/dL (ref 65–99)
Potassium: 4.5 mmol/L (ref 3.5–5.2)
Sodium: 136 mmol/L (ref 134–144)
Total Protein: 6.4 g/dL (ref 6.0–8.5)
eGFR: 102 mL/min/{1.73_m2} (ref >59)

## 2020-12-25 LAB — CBC WITH DIFFERENTIAL/PLATELET
Basophils Absolute: 0 10*3/uL (ref 0.0–0.2)
Basos: 1 %
EOS (ABSOLUTE): 0.2 10*3/uL (ref 0.0–0.4)
Eos: 3 %
Hematocrit: 40.1 % (ref 34.0–46.6)
Hemoglobin: 13.2 g/dL (ref 11.1–15.9)
Immature Grans (Abs): 0 10*3/uL (ref 0.0–0.1)
Immature Granulocytes: 0 %
Lymphocytes Absolute: 2.1 10*3/uL (ref 0.7–3.1)
Lymphs: 36 %
MCH: 28.4 pg (ref 26.6–33.0)
MCHC: 32.9 g/dL (ref 31.5–35.7)
MCV: 86 fL (ref 79–97)
Monocytes Absolute: 0.5 10*3/uL (ref 0.1–0.9)
Monocytes: 8 %
Neutrophils Absolute: 3.1 10*3/uL (ref 1.4–7.0)
Neutrophils: 52 %
Platelets: 233 10*3/uL (ref 150–450)
RBC: 4.64 x10E6/uL (ref 3.77–5.28)
RDW: 13.8 % (ref 11.7–15.4)
WBC: 5.9 10*3/uL (ref 3.4–10.8)

## 2020-12-25 LAB — TSH: TSH: 1.66 u[IU]/mL (ref 0.450–4.500)

## 2020-12-25 LAB — HCV AB W REFLEX TO QUANT PCR: HCV Ab: <0.1 s/co ratio (ref 0.0–0.9)

## 2020-12-25 LAB — HIV ANTIBODY (ROUTINE TESTING W REFLEX): HIV Screen 4th Generation wRfx: NONREACTIVE

## 2020-12-25 LAB — HCV INTERPRETATION

## 2020-12-25 MED ORDER — QUETIAPINE FUMARATE 100 MG PO TABS: 100.0000 mg | ORAL_TABLET | Freq: Every day | ORAL | 1 refills | Status: DC

## 2020-12-25 MED ORDER — QUETIAPINE FUMARATE 25 MG PO TABS: 25.0000 mg | ORAL_TABLET | Freq: Every day | ORAL | 1 refills | Status: DC

## 2020-12-25 MED ORDER — GABAPENTIN 300 MG PO CAPS: 300.0000 mg | ORAL_CAPSULE | Freq: Three times a day (TID) | ORAL | 0 refills | Status: DC

## 2020-12-25 NOTE — Telephone Encounter (Signed)
[facility]urse from [facility] is requesting clarity, She has questions as to why weren't all the pt's meds refilled. , Why she was stopping certain medication all of a sudden, And She said she's out of the Gabapentin, seroquel, Vitamin D, naltrexone, and artificial tears. so if everything could get filled.

## 2020-12-26 ENCOUNTER — Encounter (HOSPITAL_COMMUNITY): Payer: Self-pay | Admitting: Psychiatry

## 2020-12-26 ENCOUNTER — Other Ambulatory Visit: Payer: Self-pay

## 2020-12-26 ENCOUNTER — Ambulatory Visit (INDEPENDENT_AMBULATORY_CARE_PROVIDER_SITE_OTHER): Payer: No Payment, Other | Admitting: Psychiatry

## 2020-12-26 DIAGNOSIS — F319 Bipolar disorder, unspecified: Secondary | ICD-10-CM

## 2020-12-26 DIAGNOSIS — F1421 Cocaine dependence, in remission: Secondary | ICD-10-CM

## 2020-12-26 MED ORDER — NALTREXONE HCL 50 MG PO TABS: 50.0000 mg | ORAL_TABLET | Freq: Every day | ORAL | 1 refills | Status: DC

## 2020-12-26 MED ORDER — ARIPIPRAZOLE 5 MG PO TABS: 5.0000 mg | ORAL_TABLET | Freq: Every evening | ORAL | 1 refills | Status: DC

## 2020-12-26 MED ORDER — DOXEPIN HCL 25 MG PO CAPS: 25.0000 mg | ORAL_CAPSULE | Freq: Every evening | ORAL | 1 refills | Status: DC | PRN

## 2020-12-26 NOTE — Progress Notes (Signed)
BH OP Progress Note   Virtual Visit via Telephone Note  I connected with Cynthia Odonnell on 12/26/20 at  3:40 PM EDT by telephone and verified that I am speaking with the correct person using two identifiers.  Location: Patient: home Provider: Clinic   I discussed the limitations, risks, security and privacy concerns of performing an evaluation and management service by telephone and the availability of in person appointments. I also discussed with the patient that there may be a patient responsible charge related to this service. The patient expressed understanding and agreed to proceed.   I provided 23 minutes of non-face-to-face time during this encounter.     Patient Identification: Cynthia Odonnell MRN:  [id number] Date of Evaluation:  12/26/2020    Chief Complaint:   " I am fine now, I am at [facility]."  Visit Diagnosis:    ICD-10-CM   1. Bipolar 1 disorder (HCC)  F31.9 ARIPiprazole (ABILIFY) 5 MG tablet    doxepin (SINEQUAN) 25 MG capsule  2. Cocaine use disorder, severe, in early remission, dependence (HCC)  F14.21 naltrexone (DEPADE) 50 MG tablet    History of Present Illness: Pt was last contacted in December 2021 for f/up via telephone visit.  She no-show for her appointment in February. Patient is currently in [facility] in [facility].  The facility manager contacted the clinic for an urgent appointment and patient was given walk-in assessment appointment via video visit for today.  However the facility manager had a hard time turning on the video link therefore the visit had to be conducted by telephone call. Patient stated that she thinks she is doing okay.  She stated that after she last spoke with the writer she relapsed and kept using cocaine heavily.  She stated that she went to a detox and rehab facility for 21 days in [place] in April.  She relapsed almost immediately after getting out of the facility however 2 days later she came to [facility] in [facility] and has been  living there since April 29.  She stated that she has been sober for about 25 days now and she is very proud of that. Patient informed that she was started on Seroquel and gabapentin when she was in the rehab facility in [place].  She stated that she would like to continue them.  After a few seconds she stated that Seroquel makes her eat excessively and she is worried about the weight gain and then asked if she could try different mood stabilizer.  She asked about being restarted on Vraylar as she was taking that last year.  Writer informed her that we will order can be restarted however being able to provide samples for ongoing basis without insurance is kind of difficult due to limited supply of samples in the office.  Patient verbalized understanding and asked if there was any other medication similar to Vraylar that she can try.  Writer offered her a trial of Abilify, patient was agreeable to try that.  She informed that she is taking it in the past. Potential side effects of medication and risks vs benefits of treatment vs non-treatment were explained and discussed. All questions were answered. As per EMR, patient [facility] and was given refills for Seroquel 25 mg in the morning and 100 mg at night and gabapentin 300 mg 3 times daily and also Wellbutrin XL 150 mg every morning. The facility manager asked the writer if she can  also get refills for naltrexone as she was being prescribed naltrexone 50 mg daily in the facility in [place]. Writer asked patient if she would like to resume naltrexone as it can help to reduce the cravings, patient stated that she is agreeable to continue it.  She also complained of not being able to sleep well at night.  Writer asked her if she would like to retry doxepin as she took that in the past prescribed by the Clinical research associate.  Patient was unable to be tried. Potential side effects of medication and risks vs benefits of  treatment vs non-treatment were explained and discussed. All questions were answered.   Past Psychiatric History: Bipolar d/o, Cocaine use d/o  Previous Psychotropic Medications: Yes   Substance Abuse History in the last 12 months:  Yes.    Consequences of Substance Abuse: Negative  Past Medical History:  Past Medical History:  Diagnosis Date  . Abnormal Pap smear of cervix   . Anxiety   . Arthritis   . Bronchitis    Hx - uses albuterol inhaler prn  . Cocaine abuse, in remission (HCC)   . Depression   . GERD (gastroesophageal reflux disease)    zantac prn  . Headache(784.0)    otc prn  . Optic neuritis    left eye  . Smoker   . Termination of pregnancy (fetus)    x 2    Past Surgical History:  Procedure Laterality Date  . CESAREAN SECTION  07/31/2008   [facility]  . CESAREAN SECTION N/A 11/04/2014   Procedure: CESAREAN SECTION;  Surgeon: Tracey Harries, MD;  Location: [facility];  Service: Obstetrics;  Laterality: N/A;  . COLPOSCOPY    . WISDOM TOOTH EXTRACTION      Family Psychiatric History:  patient reports she is adopted. Was raised by adoptive mother and adoptive grandfather. Reports biological mother has history of cocaine use disorder and suspects she may have Bipolar Disorder.  Family History:  Family History  Problem Relation Age of Onset  . Other Neg Hx     Social History:   Social History   Socioeconomic History  . Marital status: Married    Spouse name: Not on file  . Number of children: Not on file  . Years of education: Not on file  . Highest education level: Not on file  Occupational History  . Not on file  Tobacco Use  . Smoking status: Current Every Day Smoker    Packs/day: 1.00    Years: 16.00    Pack years: 16.00    Types: Cigarettes  . Smokeless tobacco: Never Used  Vaping Use  . Vaping Use: Never used  Substance and Sexual Activity  . Alcohol use: Yes    Comment: Beer: Tonight   . Drug use: Yes    Frequency: 3.0 times per week    Types:  Cocaine, Methamphetamines    Comment: Last used: yesterday   . Sexual activity: Yes    Birth control/protection: Implant    Comment: implant "old"  Other Topics Concern  . Not on file  Social History Narrative  . Not on file   Social Determinants of Health   Financial Resource Strain: Not on file  Food Insecurity: Not on file  Transportation Needs: Not on file  Physical Activity: Not on file  Stress: Not on file  Social Connections: Not on file    Additional Social History: Works as a Production designer, theatre/television/film at [employer], has 2 children.  Allergies:   Allergies  Allergen  Reactions  . Amoxicillin Hives  . Codeine Itching  . Latex Itching  . Penicillins Hives    Has patient had a PCN reaction causing immediate rash, facial/tongue/throat swelling, SOB or lightheadedness with hypotension: No Has patient had a PCN reaction causing severe rash involving mucus membranes or skin necrosis: No Has patient had a PCN reaction that required hospitalization: No Has patient had a PCN reaction occurring within the last 10 years: No If all of the above answers are "NO", then may proceed with Cephalosporin use.  . Prednisone Itching  . Augmentin [Amoxicillin-Pot Clavulanate] Hives and Rash  . Lamictal [Lamotrigine] Rash  . Tramadol Itching and Rash    Metabolic Disorder Labs: Lab Results  Component Value Date   HGBA1C 4.8 06/01/2020   MPG 91.06 06/01/2020   MPG 97 04/17/2019   Lab Results  Component Value Date   PROLACTIN 19.5 06/01/2020   Lab Results  Component Value Date   CHOL 166 06/01/2020   TRIG 83 06/01/2020   HDL 31 (L) 06/01/2020   CHOLHDL 5.4 06/01/2020   VLDL 17 06/01/2020   LDLCALC 118 (H) 06/01/2020   LDLCALC 112 (H) 04/16/2019   Lab Results  Component Value Date   TSH 1.660 12/24/2020    Therapeutic Level Labs: No results found for: LITHIUM No results found for: CBMZ No results found for: VALPROATE  Current Medications: Current Outpatient Medications  Medication Sig  Dispense Refill  . ARIPiprazole (ABILIFY) 5 MG tablet Take 1 tablet (5 mg total) by mouth at bedtime. 30 tablet 1  . doxepin (SINEQUAN) 25 MG capsule Take 1 capsule (25 mg total) by mouth at bedtime as needed (sleep). 30 capsule 1  . albuterol (VENTOLIN HFA) 108 (90 Base) MCG/ACT inhaler Inhale 2 puffs into the lungs every 6 (six) hours as needed.    Marland Kitchen buPROPion (WELLBUTRIN XL) 150 MG 24 hr tablet Take 1 tablet (150 mg total) by mouth every morning. 30 tablet 1  . docusate sodium (COLACE) 100 MG capsule Take 100 mg by mouth 2 (two) times daily.    Marland Kitchen gabapentin (NEURONTIN) 300 MG capsule Take 1 capsule (300 mg total) by mouth 3 (three) times daily. 90 capsule 0  . hydrOXYzine (ATARAX/VISTARIL) 10 MG tablet Take 1-2 tabs PO at noon PRN for anxiety 60 tablet 1  . hydrOXYzine (ATARAX/VISTARIL) 50 MG tablet Take 1/2 - 1 full tab PO qhs PRN 30 tablet 1  . naltrexone (DEPADE) 50 MG tablet Take 1 tablet (50 mg total) by mouth daily. 30 tablet 1   No current facility-administered medications for this visit.    Musculoskeletal: Strength & Muscle Tone: within normal limits Gait & Station: normal Patient leans: Backward  Psychiatric Specialty Exam: Review of Systems  There were no vitals taken for this visit.There is no height or weight on file to calculate BMI.  General Appearance: unable to assess due to phone visit  Eye Contact:  unable to assess due to phone visit  Speech:  Clear and Coherent and Normal Rate  Volume:  Normal  Mood:  Euthymic  Affect:  Congruent  Thought Process:  Goal Directed and Descriptions of Associations: Intact  Orientation:  Full (Time, Place, and Person)  Thought Content:  Logical  Suicidal Thoughts:  No  Homicidal Thoughts:  No  Memory:  Immediate;   Good Recent;   Good  Judgement:  Fair  Insight:  Fair  Psychomotor Activity:  Normal  Concentration:  Concentration: Good and Attention Span: Fair  Recall:  Good  Fund of Knowledge:Good  Language: Good   Akathisia:  Negative  Handed:  Right  AIMS (if indicated):  0  Assets:  Communication Skills Desire for Improvement Interior and spatial designerinancial Resources/Insurance Housing Transportation Vocational/Educational  ADL's:  Intact  Cognition: WNL  Sleep:  Poor   Screenings: AIMS   Flowsheet Row Admission (Discharged) from [facility] Admission (Discharged) from [facility] Admission (Discharged) from [facility]  AIMS Total Score 0 0 0    AUDIT   Flowsheet Row Admission (Discharged) from OP Visit from 04/25/2019 [facility] Admission (Discharged) from [facility]  Alcohol Use Disorder Identification Test Final Score (AUDIT) 0 23    GAD-7   Flowsheet Row Office Visit from 12/24/2020 in [facility]  Total GAD-7 Score 17    PHQ2-9   Flowsheet Row Office Visit from 12/24/2020 in [facility] US OB FOLLOW UP ADD'L GEST from [facility] OB FOLLOW UP ADD'L GEST from 09/27[facility]2014 [facility] OB FOLLOW UP ADD'L GEST from 09[facility]06/2014 [facility]  PHQ-2 Total Score 4 0 0 0  PHQ-9 Total Score 15 -- -- --    Flowsheet Row Admission (Discharged) from OP Visit from 04/25/2019 [facility] Admission (Discharged) from [facility] Admission (Discharged) from [facility]  C-SSRS RISK CATEGORY Error: Q3, 4, or 5 should not be populated when Q2 is No Low Risk High Risk      Assessment and Plan: Patient relapsed on cocaine last year and was in 1 rehab facility in April followed by current rehab facility since beginning of  this month.  She has been sober for 25 days.  She was started on Seroquel by the first rehab facility in April however she would like to try something different as she is worried about excessive weight gain.  Patient was offered Abilify.  She also requested to be restarted on something for sleep, was offered doxepin for the same.  Was also restarted on naltrexone to help with cravings as she was prescribed that at the rehab facility in April as well.  1. Bipolar 1 disorder (HCC)  - Discontinue Seroquel and start Abilify due to excessive weight gain with Seroquel. - Start ARIPiprazole (ABILIFY) 5 MG tablet; Take 1 tablet (5 mg total) by mouth at bedtime.  Dispense: 30 tablet; Refill: 1 - Restart doxepin (SINEQUAN) 25 MG capsule; Take 1 capsule (25 mg total) by mouth at bedtime as needed (sleep).  Dispense: 30 capsule; Refill: 1 - Continue Wellbutrin XL 150 mg qam prescribed by PCP yesterday - Continue Gabapentin 300 mg TID prescribed by PCP yesterday   2. Cocaine use disorder, severe, in early remission, dependence (HCC)  - Resume naltrexone (DEPADE) 50 MG tablet; Take 1 tablet (50 mg total) by mouth daily.  Dispense: 30 tablet; Refill: 1  [facility] Follow-up in 6 weeks. Patient was informed that her care is being transferred to a different provider as Clinical research associatewriter is leaving the office.  Patient verbalized her understanding.  Zena AmosMandeep Deepa Barthel, MD 5/25/202210:06 AM

## 2020-12-27 ENCOUNTER — Telehealth: Payer: Self-pay | Admitting: *Deleted

## 2020-12-27 NOTE — Telephone Encounter (Signed)
[facility] is currently in [facility] and received results while clarifying refill needs.

## 2020-12-27 NOTE — Telephone Encounter (Signed)
-----   Message from Roney Jaffe, [place] sent at 12/25/2020 10:48 AM EDT ----- Please call patient and let her know that her thyroid function, kidney and liver function are within normal limits.  She does not show signs of anemia.  Her screening for HIV and hepatitis C were negative.

## 2020-12-28 ENCOUNTER — Ambulatory Visit (HOSPITAL_COMMUNITY): Payer: No Payment, Other | Admitting: Physician Assistant

## 2021-02-11 ENCOUNTER — Telehealth (HOSPITAL_COMMUNITY): Payer: Self-pay | Admitting: Physician Assistant

## 2021-02-11 NOTE — Telephone Encounter (Signed)
Patient calling to cancel appt 02/12/21 Cynthia Odonnell, reports she is living in a different city now and unsure if she will return.

## 2021-02-12 ENCOUNTER — Telehealth (HOSPITAL_COMMUNITY): Payer: No Payment, Other | Admitting: Physician Assistant

## 2021-03-13 ENCOUNTER — Encounter: Payer: Self-pay | Admitting: Nurse Practitioner

## 2021-03-13 ENCOUNTER — Other Ambulatory Visit: Payer: Self-pay

## 2021-03-13 ENCOUNTER — Ambulatory Visit: Payer: Self-pay | Attending: Nurse Practitioner | Admitting: Nurse Practitioner

## 2021-03-13 DIAGNOSIS — F1421 Cocaine dependence, in remission: Secondary | ICD-10-CM

## 2021-03-13 DIAGNOSIS — Z3046 Encounter for surveillance of implantable subdermal contraceptive: Secondary | ICD-10-CM

## 2021-03-13 DIAGNOSIS — Z7689 Persons encountering health services in other specified circumstances: Secondary | ICD-10-CM

## 2021-03-13 DIAGNOSIS — F319 Bipolar disorder, unspecified: Secondary | ICD-10-CM

## 2021-03-13 MED ORDER — ARIPIPRAZOLE 5 MG PO TABS: 5.0000 mg | ORAL_TABLET | Freq: Every evening | ORAL | 1 refills | Status: DC

## 2021-03-13 MED ORDER — NALTREXONE HCL 50 MG PO TABS: 50.0000 mg | ORAL_TABLET | Freq: Every day | ORAL | 1 refills | Status: DC

## 2021-03-13 MED ORDER — BUPROPION HCL ER (XL) 150 MG PO TB24: 150.0000 mg | ORAL_TABLET | ORAL | 1 refills | Status: DC

## 2021-03-13 MED ORDER — GABAPENTIN 300 MG PO CAPS: 300.0000 mg | ORAL_CAPSULE | Freq: Three times a day (TID) | ORAL | 1 refills | Status: DC

## 2021-03-13 NOTE — Progress Notes (Signed)
Virtual Visit via Telephone Note Due to national recommendations of social distancing due to COVID 19, telehealth visit is felt to be most appropriate for this patient at this time.  I discussed the limitations, risks, security and privacy concerns of performing an evaluation and management service by telephone and the availability of in person appointments. I also discussed with the patient that there may be a patient responsible charge related to this service. The patient expressed understanding and agreed to proceed.    I connected with Cynthia Odonnell on 03/13/21  at   8:50 AM EDT  EDT by telephone and verified that I am speaking with the correct person using two identifiers.  Location of Patient: Private Residence   [facility] of Provider: [facility]    Persons participating in Telemedicine visit: Bertram Denver FNP-BC Kara Dies    History of Present Illness: Telemedicine visit for: Establish Care She has a past medical history of Abnormal Pap smear of cervix, Anxiety, Arthritis, Bronchitis, Cocaine abuse, in remission,  Depression, GERD, Optic neuritis, Smoker  Bipolar 1 D/O; Cocaine use D/O She is running out of her psychiatric medications. Requesting refill until she is seen by the behavioral health specialist next month. She recently moved back to [place] so will have to re establish with psych and primary here. Currently requesting refills of Abilify 5 mg, Wellbutrin xl 150 mg, gabapentin 300 mg TID and naltrexone for substance use d/o. She is aware today's refills are courtesy only.  She does endorse restlessness with leg shaking and inability to sit still mostly noticed when she goes to her NA meetings and has to sit for a prolonged period of time. Believes this started after she was placed on ability. Symptoms of mild severity.   GU Needs nexplanon removed. Inserted in 2016. Overdue for removal by 3 years.   Past Medical History:   Diagnosis Date   Abnormal Pap smear of cervix    Anxiety    Arthritis    Bronchitis    Hx - uses albuterol inhaler prn   Cocaine abuse, in remission (HCC)    Depression    GERD (gastroesophageal reflux disease)    zantac prn   Headache(784.0)    otc prn   Optic neuritis    left eye   Smoker    Termination of pregnancy (fetus)    x 2    Past Surgical History:  Procedure Laterality Date   CESAREAN SECTION  07/31/2008   [facility]   CESAREAN SECTION N/A 11/04/2014   Procedure: CESAREAN SECTION;  Surgeon: Tracey Harries, MD;  Location: [facility];  Service: Obstetrics;  Laterality: N/A;   COLPOSCOPY     WISDOM TOOTH EXTRACTION      Family History  Problem Relation Age of Onset   Other Neg Hx     Social History   Socioeconomic History   Marital status: Married    Spouse name: Not on file   Number of children: Not on file   Years of education: Not on file   Highest education level: Not on file  Occupational History   Not on file  Tobacco Use   Smoking status: Every Day    Packs/day: 1.00    Years: 16.00    Pack years: 16.00    Types: Cigarettes   Smokeless tobacco: Never  Vaping Use   Vaping Use: Never used  Substance and Sexual Activity   Alcohol use: Yes    Comment: Beer: Tonight  Drug use: Yes    Frequency: 3.0 times per week    Types: Cocaine, Methamphetamines    Comment: Last used: yesterday    Sexual activity: Yes    Birth control/protection: Implant    Comment: implant "old"  Other Topics Concern   Not on file  Social History Narrative   Not on file   Social Determinants of Health   Financial Resource Strain: Not on file  Food Insecurity: Not on file  Transportation Needs: Not on file  Physical Activity: Not on file  Stress: Not on file  Social Connections: Not on file     Observations/Objective: Awake, alert and oriented x 3   Review of Systems  Constitutional:  Negative for fever, malaise/fatigue and weight loss.  HENT: Negative.  Negative for  nosebleeds.   Eyes: Negative.  Negative for blurred vision, double vision and photophobia.  Respiratory: Negative.  Negative for cough and shortness of breath.   Cardiovascular: Negative.  Negative for chest pain, palpitations and leg swelling.  Gastrointestinal: Negative.  Negative for heartburn, nausea and vomiting.  Musculoskeletal: Negative.  Negative for myalgias.  Neurological: Negative.  Negative for dizziness, focal weakness, seizures and headaches.  Psychiatric/Behavioral:  Positive for substance abuse. Negative for suicidal ideas. The patient is nervous/anxious and has insomnia.        SEE HPI   Assessment and Plan: Diagnoses and all orders for this visit:  Encounter to establish care  Bipolar 1 disorder (HCC) -     ARIPiprazole (ABILIFY) 5 MG tablet; Take 1 tablet (5 mg total) by mouth at bedtime. -     gabapentin (NEURONTIN) 300 MG capsule; Take 1 capsule (300 mg total) by mouth 3 (three) times daily. -     buPROPion (WELLBUTRIN XL) 150 MG 24 hr tablet; Take 1 tablet (150 mg total) by mouth every morning.  Nexplanon removal -     Ambulatory referral to Gynecology  Cocaine use disorder, severe, in early remission, dependence (HCC) -     naltrexone (DEPADE) 50 MG tablet; Take 1 tablet (50 mg total) by mouth daily.    Follow Up Instructions Return for PAP SMEAR.     I discussed the assessment and treatment plan with the patient. The patient was provided an opportunity to ask questions and all were answered. The patient agreed with the plan and demonstrated an understanding of the instructions.   The patient was advised to call back or seek an in-person evaluation if the symptoms worsen or if the condition fails to improve as anticipated.  I provided 16 minutes of non-face-to-face time during this encounter including median intraservice time, reviewing previous notes, labs, imaging, medications and explaining diagnosis and management.  Claiborne Rigg, FNP-BC

## 2021-04-24 ENCOUNTER — Ambulatory Visit: Payer: Medicaid Other | Admitting: Nurse Practitioner

## 2021-05-02 ENCOUNTER — Encounter (HOSPITAL_COMMUNITY): Payer: No Payment, Other | Admitting: Physician Assistant

## 2021-05-09 ENCOUNTER — Encounter: Payer: Medicaid Other | Admitting: Obstetrics & Gynecology

## 2021-05-21 ENCOUNTER — Ambulatory Visit (HOSPITAL_COMMUNITY)
Admission: RE | Admit: 2021-05-21 | Discharge: 2021-05-21 | Disposition: A | Discharge status: Home / Self Care | Payer: No Payment, Other | Attending: Psychiatry | Admitting: Psychiatry
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
## 2021-05-21 NOTE — H&P (Signed)
Behavioral Health Medical Screening Exam  Cynthia Odon[facility] is an 35 y.o. female who presented to [facility] as a voluntary walk-in with complaint of a sore throat and cocaine abuse. She stated she last used "crack" cocaine at 1:30 pm and wants help with detox from cocaine. Patient was seen and evaluated, case discussed with Dr Lucianne Muss. She has a history of cocaine abuse, depression and anxiety and has multiple emergency room visits, rehab stays and inpatient admissions. She is currently homeless. She is irritable. She stated she has been off her medication Wellbutrin, Gabapentin and 2 others, for 4 weeks, however did not know who was prescribing them or where she was getting them. She denies suicidal and homicidal ideation, she denies hallucinations and does not appear to be responding to internal stimuli. She was provided with homeless and outpatient resources. Patient does not meet criteria for inpatient admission.   Total Time spent with patient: 30 minutes  Psychiatric Specialty Exam: Physical Exam Vitals reviewed.  HENT:     Head: Normocephalic.     Nose: Congestion present.  Pulmonary:     Effort: Pulmonary effort is normal.  Musculoskeletal:        General: Normal range of motion.     Cervical back: Normal range of motion.  Neurological:     General: No focal deficit present.     Mental Status: She is alert and oriented to person, place, and time.  Psychiatric:        Attention and Perception: Attention normal. She does not perceive auditory or visual hallucinations.        Mood and Affect: Affect is angry.        Speech: Speech normal.        Behavior: Behavior normal.        Thought Content: Thought content is not paranoid or delusional. Thought content does not include homicidal or suicidal ideation. Thought content does not include homicidal or suicidal plan.        Cognition and Memory: Cognition normal.   Review of Systems  Constitutional: Negative.   HENT:  Positive for congestion,  sore throat and trouble swallowing.   Respiratory: Negative.    Gastrointestinal: Negative.   Neurological: Negative.    Blood pressure (!) 134/96, pulse 84, temperature 98 F (36.7 C), temperature source Oral, resp. rate 18, SpO2 100 %.There is no height or weight on file to calculate BMI. General Appearance: Disheveled Eye Contact:  Fair Speech:  Clear and Coherent Volume:  Normal Mood:  Irritable Affect:  Congruent Thought Process:  Coherent Orientation:  Full (Time, Place, and Person) Thought Content:  Logical and Hallucinations: None Suicidal Thoughts:  No Homicidal Thoughts:  No Memory:  Immediate;   Fair Recent;   Fair Remote;   Fair Judgement:  Fair Insight:  Shallow Psychomotor Activity:  Normal Concentration: Concentration: Fair and Attention Span: Fair Recall:  [employer] of Knowledge:Good Language: Good Akathisia:  No Handed:  Right AIMS (if indicated):    Assets:  Communication Skills Resilience Vocational/Educational Sleep:     Musculoskeletal: Strength & Muscle Tone: within normal limits Gait & Station: normal Patient leans: N/A  Blood pressure (!) 134/96, pulse 84, temperature 98 F (36.7 C), temperature source Oral, resp. rate 18, SpO2 100 %.  Recommendations: Based on my evaluation the patient does not appear to have an emergency medical condition.  Laveda Abbe, NP 05/21/2021, 4:53 PM

## 2021-07-02 ENCOUNTER — Telehealth: Payer: Self-pay

## 2021-07-02 NOTE — Telephone Encounter (Signed)
Transition Care Management Unsuccessful Follow-up Telephone Call  Date of discharge and from where:  [facility]  06/18/2021  Attempts:  1st Attempt  Both numbers in chart are invalid  Reason for unsuccessful TCM follow-up call:  Missing or invalid number  Rowe Pavy, RN, BSN, CEN [facility] [employer] [phone]

## 2021-07-04 ENCOUNTER — Telehealth: Payer: Self-pay | Admitting: *Deleted

## 2021-07-04 NOTE — Telephone Encounter (Signed)
Transition Care Management Unsuccessful Follow-up Telephone Call  Date of discharge and from where:  06/18/21  [facility]  Attempts:  2nd Attempt  Reason for unsuccessful TCM follow-up call:  Left voice message  [facility], BSN RN Case Manager [phone]

## 2022-09-24 ENCOUNTER — Other Ambulatory Visit (HOSPITAL_COMMUNITY)
Admission: EM | Admit: 2022-09-24 | Discharge: 2022-09-27 | Disposition: A | Discharge status: Home / Self Care | Payer: No Typology Code available for payment source | Attending: Psychiatry | Admitting: Psychiatry

## 2022-09-24 DIAGNOSIS — F250 Schizoaffective disorder, bipolar type: Secondary | ICD-10-CM | POA: Diagnosis not present

## 2022-09-24 DIAGNOSIS — F319 Bipolar disorder, unspecified: Secondary | ICD-10-CM

## 2022-09-24 DIAGNOSIS — B974 Respiratory syncytial virus as the cause of diseases classified elsewhere: Secondary | ICD-10-CM | POA: Insufficient documentation

## 2022-09-24 DIAGNOSIS — B338 Other specified viral diseases: Secondary | ICD-10-CM

## 2022-09-24 DIAGNOSIS — F1420 Cocaine dependence, uncomplicated: Secondary | ICD-10-CM | POA: Insufficient documentation

## 2022-09-24 DIAGNOSIS — F1410 Cocaine abuse, uncomplicated: Secondary | ICD-10-CM | POA: Diagnosis present

## 2022-09-24 DIAGNOSIS — Z1152 Encounter for screening for COVID-19: Secondary | ICD-10-CM | POA: Insufficient documentation

## 2022-09-24 DIAGNOSIS — Z79899 Other long term (current) drug therapy: Secondary | ICD-10-CM | POA: Insufficient documentation

## 2022-09-24 LAB — COMPREHENSIVE METABOLIC PANEL
ALT: 10 U/L (ref 0–44)
AST: 14 U/L — ABNORMAL LOW (ref 15–41)
Albumin: 3.6 g/dL (ref 3.5–5.0)
Alkaline Phosphatase: 61 U/L (ref 38–126)
Anion gap: 11 (ref 5–15)
BUN: 8 mg/dL (ref 6–20)
CO2: 24 mmol/L (ref 22–32)
Calcium: 9 mg/dL (ref 8.9–10.3)
Chloride: 102 mmol/L (ref 98–111)
Creatinine, Ser: 0.71 mg/dL (ref 0.44–1.00)
GFR, Estimated: >60 mL/min (ref >60)
Glucose, Bld: 92 mg/dL (ref 70–99)
Potassium: 2.9 mmol/L — ABNORMAL LOW (ref 3.5–5.1)
Sodium: 137 mmol/L (ref 135–145)
Total Bilirubin: 0.8 mg/dL (ref 0.3–1.2)
Total Protein: 6.6 g/dL (ref 6.5–8.1)

## 2022-09-24 LAB — POCT URINE DRUG SCREEN - MANUAL ENTRY (I-SCREEN)
POC Amphetamine UR: POSITIVE — AB
POC Buprenorphine (BUP): NOT DETECTED
POC Cocaine UR: POSITIVE — AB
POC Marijuana UR: POSITIVE — AB
POC Methadone UR: NOT DETECTED
POC Methamphetamine UR: POSITIVE — AB
POC Morphine: NOT DETECTED
POC Oxazepam (BZO): NOT DETECTED
POC Oxycodone UR: NOT DETECTED
POC Secobarbital (BAR): NOT DETECTED

## 2022-09-24 LAB — CBC WITH DIFFERENTIAL/PLATELET
Abs Immature Granulocytes: 0.02 10*3/uL (ref 0.00–0.07)
Basophils Absolute: 0 10*3/uL (ref 0.0–0.1)
Basophils Relative: 1 %
Eosinophils Absolute: 0 10*3/uL (ref 0.0–0.5)
Eosinophils Relative: 0 %
HCT: 41.1 % (ref 36.0–46.0)
Hemoglobin: 14.2 g/dL (ref 12.0–15.0)
Immature Granulocytes: 0 %
Lymphocytes Relative: 22 %
Lymphs Abs: 1.9 10*3/uL (ref 0.7–4.0)
MCH: 29.5 pg (ref 26.0–34.0)
MCHC: 34.5 g/dL (ref 30.0–36.0)
MCV: 85.4 fL (ref 80.0–100.0)
Monocytes Absolute: 0.4 10*3/uL (ref 0.1–1.0)
Monocytes Relative: 5 %
Neutro Abs: 6.1 10*3/uL (ref 1.7–7.7)
Neutrophils Relative %: 72 %
Platelets: 305 10*3/uL (ref 150–400)
RBC: 4.81 MIL/uL (ref 3.87–5.11)
RDW: 13.4 % (ref 11.5–15.5)
WBC: 8.6 10*3/uL (ref 4.0–10.5)
nRBC: 0 % (ref 0.0–0.2)

## 2022-09-24 LAB — LIPID PANEL
Cholesterol: 184 mg/dL (ref 0–200)
HDL: 42 mg/dL (ref >40)
LDL Cholesterol: 125 mg/dL — ABNORMAL HIGH (ref 0–99)
Total CHOL/HDL Ratio: 4.4 RATIO
Triglycerides: 85 mg/dL (ref <150)
VLDL: 17 mg/dL (ref 0–40)

## 2022-09-24 LAB — POCT PREGNANCY, URINE: Preg Test, Ur: NEGATIVE

## 2022-09-24 LAB — HEMOGLOBIN A1C
Hgb A1c MFr Bld: 4.4 % — ABNORMAL LOW (ref 4.8–5.6)
Mean Plasma Glucose: 79.58 mg/dL

## 2022-09-24 LAB — ETHANOL: Alcohol, Ethyl (B): <10 mg/dL (ref <10)

## 2022-09-24 LAB — RESP PANEL BY RT-PCR (RSV, FLU A&B, COVID)  RVPGX2
Influenza A by PCR: NEGATIVE
Influenza B by PCR: NEGATIVE
Resp Syncytial Virus by PCR: POSITIVE — AB
SARS Coronavirus 2 by RT PCR: NEGATIVE

## 2022-09-24 LAB — MAGNESIUM: Magnesium: 1.9 mg/dL (ref 1.7–2.4)

## 2022-09-24 LAB — TSH: TSH: 0.662 u[IU]/mL (ref 0.350–4.500)

## 2022-09-24 MED ORDER — ARIPIPRAZOLE 5 MG PO TABS
5.0000 mg | ORAL_TABLET | Freq: Every day | ORAL | Status: DC
  Administered 2022-09-25 – 2022-09-27 (×3): 5 mg via ORAL
  Filled 2022-09-24 (×3): qty 1

## 2022-09-24 MED ORDER — MAGNESIUM HYDROXIDE 400 MG/5ML PO SUSP: 30.0000 mL | Freq: Every day | ORAL | Status: DC | PRN

## 2022-09-24 MED ORDER — HYDROXYZINE HCL 25 MG PO TABS
25.0000 mg | ORAL_TABLET | Freq: Three times a day (TID) | ORAL | Status: DC | PRN
  Administered 2022-09-24 – 2022-09-25 (×2): 25 mg via ORAL
  Filled 2022-09-24 (×2): qty 1

## 2022-09-24 MED ORDER — GABAPENTIN 300 MG PO CAPS
300.0000 mg | ORAL_CAPSULE | Freq: Three times a day (TID) | ORAL | Status: DC
  Administered 2022-09-24 – 2022-09-27 (×8): 300 mg via ORAL
  Filled 2022-09-24 (×8): qty 1

## 2022-09-24 MED ORDER — BENZOCAINE 10 % MT GEL
1.0000 | Freq: Three times a day (TID) | OROMUCOSAL | Status: DC | PRN
  Administered 2022-09-26: 1 via OROMUCOSAL
  Filled 2022-09-24: qty 9

## 2022-09-24 MED ORDER — TRAZODONE HCL 50 MG PO TABS
50.0000 mg | ORAL_TABLET | Freq: Every evening | ORAL | Status: DC | PRN
  Administered 2022-09-24 – 2022-09-25 (×2): 50 mg via ORAL
  Filled 2022-09-24 (×2): qty 1

## 2022-09-24 MED ORDER — ACETAMINOPHEN 325 MG PO TABS
650.0000 mg | ORAL_TABLET | Freq: Four times a day (QID) | ORAL | Status: DC | PRN
  Administered 2022-09-25: 650 mg via ORAL
  Filled 2022-09-24: qty 2

## 2022-09-24 MED ORDER — NICOTINE 14 MG/24HR TD PT24
14.0000 mg | MEDICATED_PATCH | Freq: Every day | TRANSDERMAL | Status: DC
  Administered 2022-09-24 – 2022-09-26 (×2): 14 mg via TRANSDERMAL
  Filled 2022-09-24 (×4): qty 1

## 2022-09-24 MED ORDER — ALUM & MAG HYDROXIDE-SIMETH 200-200-20 MG/5ML PO SUSP: 30.0000 mL | ORAL | Status: DC | PRN

## 2022-09-24 NOTE — BH Assessment (Signed)
Comprehensive Clinical Assessment (CCA) Note  09/24/2022 Cynthia Odonnell [id number]   Disposition: Per Beatriz Stallion, NP admission to [facility] is recommended.    The patient demonstrates the following risk factors for suicide: Chronic risk factors for suicide include: psychiatric disorder of Bipolar I Disorder and substance use disorder. Acute risk factors for suicide include: social withdrawal/isolation and loss (financial, interpersonal, professional). Protective factors for this patient include: responsibility to others (children, family) and hope for the future. Considering these factors, the overall suicide risk at this point appears to be moderate. Patient is appropriate for outpatient follow up once stabilized.  Patient is a 37 y.o. with a hx of Bioplar Disorder, currently untreated, and Cocaine Use Disorder, severe who presents voluntarily to [facility] for assessment.  Patient reports she has relapsed 3 months ago and has been struggling with depression. Patient has had multiple stressors, stating she has "lost a lot this past year." She lost her grandfather 12/2021 who was"my best friend." Her mother is in a facility and apparently while there she sold her home that patient has been staying in for the past 3 months. Her mother recently sold the home to patient's cousin and then filed trespassing charges against her. Patient states she went to stay with a family friend, however the friend's son was uncomfortable with her being there as his son would visit the grandmother and he was uncomfortable with patient staying there. Patient is tearful as she states she "has lost everything and I'm alone." She is also going through a divorce and there is a no contact order in place, blocking her from seeing her kids (ages 45 and 43, currently living with her ex-husband). Paitent states she has been struggling with her mental health and has been on and off medications and in and out of treatment  programs since the age of 40. She states she has not been on medications for a couple of years, stating she was doing "okay and I have my faith." She states she was involved with a couple of different ministries and has a "sense of purpose." Patient admits to having suicidal thoughts a month ago, considering hanging herself. She also considered jumping from a bridge today, stating she is "fighting this battle in my mind." She identifies protective factors as her kids and her faith. Patient is seeking inpatient options for depression and detox. She reports she has been using approximately a gram per day of crack cocaine for the past 3 months. She also uses meth a couple times per week, most recently she used an unknown amount 2 days ago.  She denies HI. She endorses seeing visual hallucinations and believes she may hear voices, however struggles to discern whether the thoughts are in her mind or if she is hearing voices. She becomes frustrated with questions to clarify, stating "it's all just a lot."   Chief Complaint: worsening depression, SI, relapse  Visit Diagnosis: Bipolar I Disorder                             Cocaine Use Disorder, severe                             Other Stimulant Dependence, amphetamine type, moderate  CCA Screening, Triage and Referral (STR)  Patient Reported Information How did you hear about [place]? Self  What Is the Reason for Your Visit/Call Today? Patient is a 36 y.o. with a hx of Bioplar Disorder, currently untreated and Cocaine Use Disorder, severe who presents reporting she has relapsed 3 months ago and has been struggling with depression.  Patient has had multiple stressors, stating she has "lost a lot this past year."  She lost her grandfather 12/2021 who was"my best friend."  Her mother is in a facility and apparently while there she sold her home, that patient has been staying in for the past 3 months.  Her mother recently sold the home to  patient's cousin and then filed trespassing charges against her.  Patient states she went to stay with a family friend, however the friend's son was uncomfortable with her being there as his son would visit the grandmother and he was uncomfortable with patient staying there. Patient is tearful as she states she "has lost everything and I'm alone."  She is also going through a divorce and there is a no contact order in place, blocking her from seeing her kids (ages 64 and 56, currently living with her ex-husband). Paitent states she has been struggling with her mental health and has been on and off medications and in and out of treatment programs since the age of 65.  She states she has not been on medications for a couple of years, stating she was doing "okay and I have my faith."  She states she was involved with a couple of different ministries and has a "sense of purpose."  Patient admits to having suicidal thoughts a month ago, considering hanging herself.  She also considered jumping from a bridge today, stating she is "fighting this battle in my mind."  She identifies protective factors as her kids and her faith.  Patient is seeking inpatient options for depression and detox.  She reports she has been using approximately a gram per day of crack cocaine for the past 3 months.  She denies HI.  She endorses seeing visual hallucinations and believes she may hear voices, however struggles to discern whether the thoughts are in her mind or if she is hearing voices.  She becomes frustrated with questions to clarify, stating "it's all just a lot."  How Long Has This Been Causing You Problems? > than 6 months  What Do You Feel Would Help You the Most Today? Treatment for Depression or other mood problem   Have You Recently Had Any Thoughts About Hurting Yourself? Yes  Are You Planning to Commit Suicide/Harm Yourself At This time? No  Flowsheet Row ED from 09/24/2022 in [facility]  OP Visit from 05/21/2021 in [place] Admission (Discharged) from OP Visit from 04/25/2019 in [place] Moderate Risk No Risk Error: Q3, 4, or 5 should not be populated when Q2 is No      Have you Recently Had Thoughts About [place]? No  Are You Planning to Harm Someone at This Time? No  Explanation: No data recorded  Have You Used Any Alcohol or Drugs in the Past 24 Hours? Yes  What Did You Use and How Much? Cocaine - approximately 1 gram   Do You Currently Have a Therapist/Psychiatrist? No  Name of Therapist/Psychiatrist: Name of Therapist/Psychiatrist: No current providers   Have You Been Recently Discharged From Any Office Practice or Programs?  No  Explanation of Discharge From Practice/Program: N/A     CCA Screening Triage Referral Assessment Type of Contact: Face-to-Face  Telemedicine Service Delivery:   Is this Initial or Reassessment?   Date Telepsych consult ordered in [facility]:    Time Telepsych consult ordered in [facility]:    Location of Assessment: [facility]  Provider Location: [facility] Assessment Services   Collateral Involvement: None   Does Patient Have a Stage manager Guardian? No  Legal Guardian Contact Information: N/A  Copy of Legal Guardianship Form: -- (N/A)  Legal Guardian Notified of Arrival: -- (N/A)  Legal Guardian Notified of Pending Discharge: -- (N/A)  If Minor and Not Living with Parent(s), Who has Custody? N/A  Is CPS involved or ever been involved? In the Past  Is APS involved or ever been involved? Never   Patient Determined To Be At Risk for Harm To Self or Others Based on Review of Patient Reported Information or Presenting Complaint? Yes, for Self-Harm  Method: Plan without intent  Availability of Means: No access or NA  Intent: Vague intent or NA  Notification Required: -- (N/A, no HI)  Additional  Information for Danger to Others Potential: -- (N/A, no HI)  Additional Comments for Danger to Others Potential: N/A, no HI  Are There Guns or Other Weapons in [place]? No  Types of Guns/Weapons: N/A  Are These Weapons Safely Secured?                            -- (N/A)  Who Could Verify You Are Able To Have These Secured: N/A  Do You Have any Outstanding Charges, Pending Court Dates, Parole/Probation? 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  Contacted To Inform of Risk of Harm To Self or Others: Other: Comment [facility] providers aware)    Does Patient Present under Involuntary Commitment? No    [place] of Residence: [place]   Patient Currently Receiving the Following Services: Not Receiving Services   Determination of Need: Urgent (48 hours)   Options For Referral: Intensive Outpatient Therapy; Inpatient Hospitalization; Facility-Based Crisis; [facility]     CCA Biopsychosocial Patient Reported Schizophrenia/Schizoaffective Diagnosis in Past: No   Strengths: Seeking treatment   Mental Health Symptoms Depression:   Difficulty Concentrating; Fatigue; Hopelessness; Tearfulness; Worthlessness   Duration of Depressive symptoms:  Duration of Depressive Symptoms: Greater than two weeks   Mania:   N/A   Anxiety:    Difficulty concentrating; Tension; Worrying   Psychosis:   Hallucinations   Duration of Psychotic symptoms:  Duration of Psychotic Symptoms: Less than six months   Trauma:   None   Obsessions:   None   Compulsions:   N/A   Inattention:   None   Hyperactivity/Impulsivity:   N/A   Oppositional/Defiant Behaviors:   N/A   Emotional Irregularity:   Chronic feelings of emptiness; Mood lability   Other Mood/Personality Symptoms:   feeling hopeless    Mental Status Exam Appearance and self-care  Stature:   Average   Weight:   Average weight    Clothing:   Age-appropriate   Grooming:   Normal   Cosmetic use:   Age appropriate   Posture/gait:   Normal   Motor activity:   Not Remarkable   Sensorium  Attention:   Normal   Concentration:   Normal   Orientation:  Time; Place; Person; Object   Recall/memory:   Normal   Affect and Mood  Affect:   Depressed; Flat   Mood:   Hopeless; Depressed   Relating  Eye contact:   Normal   Facial expression:   Anxious   Attitude toward examiner:   Cooperative; Irritable   Thought and Language  Speech flow:  Clear and Coherent   Thought content:   Appropriate to Mood and Circumstances   Preoccupation:   None   Hallucinations:   None   Organization:   Intact   Transport planner of Knowledge:   Average   Intelligence:   Average   Abstraction:   Normal   Judgement:   Impaired   Reality Testing:   Adequate   Insight:   Fair   Decision Making:   Normal   Social Functioning  Social Maturity:   Isolates   Social Judgement:   Heedless   Stress  Stressors:   Work   Coping Ability:   Normal   Skill Deficits:   Activities of daily living   Supports:   Family     Religion: Religion/Spirituality Are You A Religious Person?: Yes What is Your Religious Affiliation?: Christian How Might This Affect Treatment?: NA  Leisure/Recreation: Leisure / Recreation Do You Have Hobbies?: No  Exercise/Diet: Exercise/Diet Do You Exercise?: No Have You Gained or Lost A Significant Amount of Weight in the Past Six Months?: No Do You Follow a Special Diet?: No Do You Have Any Trouble Sleeping?: Yes Explanation of Sleeping Difficulties: varies   CCA Employment/Education Employment/Work Situation: Employment / Work Situation Employment Situation: Unemployed Patient's Job has Been Impacted by Current Illness: No Has Patient ever Been in Passenger transport manager?: No  Education: Education Is Patient Currently Attending School?:  No Last Grade Completed:  (unknown) Did You Nutritional therapist?: No Did You Have An Individualized Education Program (IIEP): No Did You Have Any Difficulty At School?: No Patient's Education Has Been Impacted by Current Illness: No   CCA Family/Childhood History Family and Relationship History: Family history Marital status: Divorced Divorced, when?: going through divorce now What types of issues is patient dealing with in the relationship?: patient feels issues are related to her drug use problems Additional relationship information: NA Does patient have children?: Yes How many children?: 2 How is patient's relationship with their children?: 60 y.o. son and 30 y.o. daughter - no issues outside of no contact order currently in place, blocking her from seeing children  Childhood History:  Childhood History By whom was/is the patient raised?: Grandparents, Adoptive parents Did patient suffer any verbal/emotional/physical/sexual abuse as a child?: No Did patient suffer from severe childhood neglect?: No Has patient ever been sexually abused/assaulted/raped as an adolescent or adult?: No Was the patient ever a victim of a crime or a disaster?: No Witnessed domestic violence?: No Has patient been affected by domestic violence as an adult?: No       CCA Substance Use Alcohol/Drug Use: Alcohol / Drug Use Pain Medications: See MAR Prescriptions: See MAR Over the Counter: See MAR History of alcohol / drug use?: Yes Longest period of sobriety (when/how long): 5 months Negative Consequences of Use: Personal relationships, Financial Withdrawal Symptoms: Agitation Substance #1 Name of Substance 1: Cocaine 1 - Age of First Use: 20s 1 - Amount (size/oz): approximately 1 gram 1 - Frequency: daily 1 - Duration: 3 months ago relapsed 1 - Last Use / Amount: today - 1 gram 1 - Method of Aquiring: doesnt'  answer, states " I've been an addict for 20 years, you know how it goes." 1- Route of  Use: smokes Substance #2 Name of Substance 2: Meth 2 - Age of First Use: 20 2 - Amount (size/oz): Varies 2 - Frequency: 2 times per week 2 - Duration: Ongoing 2 - Last Use / Amount: 2 days ago 2 - Method of Aquiring: NA 2 - Route of Substance Use: NA                     ASAM's:  Six Dimensions of Multidimensional Assessment  Dimension 1:  Acute Intoxication and/or Withdrawal Potential:   Dimension 1:  Description of individual's past and current experiences of substance use and withdrawal: Minimal risk of severel withdrawal  Dimension 2:  Biomedical Conditions and Complications:   Dimension 2:  Description of patient's biomedical conditions and  complications: adequate ability to cope with physical discomfort  Dimension 3:  Emotional, Behavioral, or Cognitive Conditions and Complications:  Dimension 3:  Description of emotional, behavioral, or cognitive conditions and complications: untreated Bipolar Disorder  Dimension 4:  Readiness to Change:  Dimension 4:  Description of Readiness to Change criteria: Seeking treatment  Dimension 5:  Relapse, Continued use, or Continued Problem Potential:  Dimension 5:  Relapse, continued use, or continued problem potential critiera description: impaired recognition of MI and SA relapse issues  Dimension 6:  Recovery/Living Environment:  Dimension 6:  Recovery/Iiving environment criteria description: limited support  ASAM Severity Score: ASAM's Severity Rating Score: 8  ASAM Recommended Level of Treatment: ASAM Recommended Level of Treatment: Level III Residential Treatment   Substance use Disorder (SUD) Substance Use Disorder (SUD)  Checklist Symptoms of Substance Use: Evidence of tolerance, Continued use despite having a persistent/recurrent physical/psychological problem caused/exacerbated by use, Persistent desire or unsuccessful efforts to cut down or control use  Recommendations for Services/Supports/Treatments: Recommendations for  Services/Supports/Treatments Recommendations For Services/Supports/Treatments: Facility Based Crisis, Individual Therapy, Medication Management, CD-IOP Intensive Chemical Dependency Program  Discharge Disposition:    DSM5 Diagnoses: Patient Active Problem List   Diagnosis Date Noted   Schizoaffective disorder, bipolar type ([place]) 12/25/2020   Psychophysiological insomnia 12/25/2020   Anxiety state 12/25/2020   Cocaine use disorder, severe, dependence ([place]) 04/05/2020   Bipolar 1 disorder, mixed, moderate (HCC) 04/25/2019   Substance use disorder 04/16/2019   Substance induced mood disorder ([place]) 03/06/2018   Bipolar 1 disorder, mixed, severe (HCC) 03/05/2018   Bipolar 1 disorder (HCC)    Bradycardia    Cocaine abuse with intoxication and without complication (HCC)    Hypotension    Suicide attempt ([place])    H/O cesarean section 11/04/2014   [redacted] weeks gestation of pregnancy    Maternal morbid obesity in third trimester, antepartum ([place])    Excessive weight gain in pregnancy in second trimester    [redacted] weeks gestation of pregnancy    Smoking    Poor fetal growth affecting management of mother in third trimester, antepartum    [redacted] weeks gestation of pregnancy    Maternal morbid obesity in second trimester, antepartum ([place])    [redacted] weeks gestation of pregnancy    Evaluate anatomy not seen on prior sonogram    Tobacco smoking affecting pregnancy in second trimester, antepartum    Obesity affecting pregnancy in second trimester    Uterine scar from previous cesarean delivery, antepartum complication    Encounter for fetal anatomic survey    [redacted] weeks gestation of pregnancy      Referrals  to Alternative Service(s): Referred to Alternative Service(s):   Place:   Date:   Time:    Referred to Alternative Service(s):   Place:   Date:   Time:    Referred to Alternative Service(s):   Place:   Date:   Time:    Referred to Alternative Service(s):   Place:   Date:   Time:     Fransico Meadow,  [facility]

## 2022-09-24 NOTE — Progress Notes (Signed)
09/24/22 1620  [place] (Walk-ins at [facility] only)  How Did You Hear About [place]? Self  What Is the Reason for Your Visit/Call Today? Patient is a 37 y.o. with a hx of Bioplar Disorder, currently untreated and Cocaine Use Disorder, severe who presents reporting she has relapsed 3 months ago and has been struggling with depression.  Patient has had multiple stressors, stating she has "lost a lot this past year."  She lost her grandfather 12/2021 who was"my best friend."  Her mother is in a facility and apparently while there she sold her home, that patient has been staying in for the past 3 months.  Her mother recently sold the home to patient's cousin and then filed trespassing charges against her.  Patient states she went to stay with a family friend, however the friend's son was uncomfortable with her being there as his son would visit the grandmother and he was uncomfortable with patient staying there. Patient is tearful as she states she "has lost everything and I'm alone."  She is also going through a divorce and there is a no contact order in place, blocking her from seeing her kids (ages 69 and 51, currently living with her ex-husband). Paitent states she has been struggling with her mental health and has been on and off medications and in and out of treatment programs since the age of 6.  She states she has not been on medications for a couple of years, stating she was doing "okay and I have my faith."  She states she was involved with a couple of different ministries and has a "sense of purpose."  Patient admits to having suicidal thoughts a month ago, considering hanging herself.  She also considered jumping from a bridge today, stating she is "fighting this battle in my mind."  She identifies protective factors as her kids and her faith.  Patient is seeking inpatient options for depression and detox.  She reports she has been using approximately a gram per day of crack cocaine for the past 3  months.  She denies HI.  She endorses seeing visual hallucinations and believes she may hear voices, however struggles to discern whether the thoughts are in her mind or if she is hearing voices.  She becomes frustrated with questions to clarify, stating "it's all just a lot."  How Long Has This Been Causing You Problems? > than 6 months  Have You Recently Had Any Thoughts About Hurting Yourself? Yes  How long ago did you have thoughts about hurting yourself? SI, considered jumping from a bridge, however denies intent  Are You Planning to [place] At This time? No  Have you Recently Had Thoughts About [place]? No  Are You Planning To Harm Someone At This Time? No  Are you currently experiencing any auditory, visual or other hallucinations? Yes  Please explain the hallucinations you are currently experiencing: possible AH, patient struggles to describe.  She endorses VH of shadows  Have You Used Any Alcohol or Drugs in the Past 24 Hours? Yes  How long ago did you use Drugs or Alcohol? today  What Did You Use and How Much? Cocaine - approximately 1 gram  Do you have any current medical co-morbidities that require immediate attention? No  Clinician description of patient physical appearance/behavior: Patient is tearful, somewhat irritable during assessment, frustrated with questions.  She is AAOx4  What Do You Feel Would Help You the Most Today? Treatment for Depression or other  mood problem  If access to [facility] was not available, would you have sought care in the Emergency Department? Yes  Determination of Need Urgent (48 hours)  Options For Referral Intensive Outpatient Therapy;Inpatient Hospitalization;Facility-Based Crisis;BH Urgent Care

## 2022-09-24 NOTE — ED Notes (Signed)
Patient observed/assessed in room in bed appearing in no immediate distress resting peacefully. Q15 minute checks continued by MHT and nursing staff. Will continue to monitor and support. 

## 2022-09-24 NOTE — ED Notes (Signed)
Pt admitted to [facility] requesting detox from methamphetamines and cocaine and endorsing SI no plan on admission. Pt endorsed AVH on admission. Currently, pt denies SI/HI/AVH. Cooperative but easily agitated with questions being asked throughout interview process. Skin assessment completed. Oriented to unit. Meal and drink offered. Environment secured. Pt was able to verbally contract for safety. Will monitor for safety.

## 2022-09-24 NOTE — ED Provider Notes (Signed)
Facility Based Crisis Admission H&P  Date: 09/24/22 Patient Name: Cynthia Odonnell MRN: [id number] Chief Complaint: Cocaine Use Disorder  Diagnoses:  Final diagnoses:  Cocaine use disorder, severe, dependence ([place])  Bipolar 1 disorder [facility])    HPI: Patient presents voluntarily to [facility] behavioral health for walk-in assessment.   Patient is assessed, face-to-face, by nurse practitioner. She is seated in assessment area, no acute distress. Consulted with provider, Dr.  Leverne Humbles, and chart reviewed on 09/24/2022. She  is alert and oriented, pleasant and cooperative during assessment.   Patient states "I just want to be able to see my kids again and to have a future, I need help with my cocaine use."  Cynthia Odonnell reports intermittent use of cocaine for 22 years.  For the last 1 to 2 years she has used cocaine daily, she ingests by smoking crack cocaine.  She reports the only days she does not use cocaine are the days when she has no money to purchase it.  Last use of cocaine earlier this date.  She also uses methamphetamine approximately 2 times per week.  Last use of methamphetamine 3 days ago.  She also uses marijuana daily if she cannot afford it.  Last marijuana use on yesterday.  She denies alcohol use, denies substance use aside from cocaine, methamphetamine and marijuana.  Cynthia Odonnell completed a 30-day program at [facility] approximately 18 months ago.  She briefly resided at sober living prior to relapsing on cocaine.  She would like to return to residential substance use treatment if possible.  Recent stressors include being homeless for 4 days.  Patient reports she most recently resided at the home of her mother, her mother and is in a nursing home. Patient reports her mother is "very sick, possibly dying."  Her mother sold the home after telling Cynthia Odonnell to leave many times.  Patient reports her mother had her trespassed she was also arrested for 2 outstanding warrants related to solicitation and  panhandling.  She has upcoming court dates on 10/21/2022 and 10/31/2022.  Mental health history includes bipolar 1 disorder, generalized anxiety disorder, depression, bipolar 1 disorder, cocaine use disorder, suicide attempt, substance-induced mood disorder, schizoaffective disorder bipolar type.  She is not linked with outpatient psychiatry, has not been seen by outpatient psychiatry for approximately 10 years.  She has been stable on a combination of Abilify and gabapentin in the past.  Unable to recall additional medications.  She endorses history of multiple previous inpatient psychiatric hospitalizations.  No family mental health history reported.  Patient  presents with depressed mood, congruent affect. She  denies suicidal and homicidal ideations.  Patient easily  contracts verbally for safety with this Probation officer.   Patient has normal speech and behavior.  She  denies auditory and visual hallucinations.  Patient is able to converse coherently with goal-directed thoughts and no distractibility or preoccupation.  Denies symptoms of paranoia.  Objectively there is no evidence of psychosis/mania or delusional thinking.  Cynthia Odonnell currently homeless in [place].  She denies access to weapons.  She is currently not employed.  Patient endorses average sleep and appetite.  Patient offered support and encouragement.  Patient remains voluntary.  She agrees with plan for admission to facility based crisis unit.  Reviewed medications and offered patient opportunity to ask questions, discussed side effects.  Patient verbalizes plan to remain full CODE STATUS.   PHQ 2-9:  [place] Office Visit from 12/24/2020 in [place] 1  Thoughts that you would be better off dead,  or of hurting yourself in some way Not at all  PHQ-9 Total Score 15       [place] ED from 09/24/2022 in [facility] OP Visit from 05/21/2021 in [place]  Admission (Discharged) from OP Visit from 04/25/2019 in [place] CATEGORY Moderate Risk No Risk Error: Q3, 4, or 5 should not be populated when Q2 is No        Total Time spent with patient: 45 minutes  Musculoskeletal  Strength & Muscle Tone: within normal limits Gait & Station: normal Patient leans: N/A  Psychiatric Specialty Exam  Presentation General Appearance:  Appropriate for Environment; Casual  Eye Contact: Good  Speech: Clear and Coherent; Normal Rate  Speech Volume: Normal  Handedness: Right   Mood and Affect  Mood: Depressed  Affect: Depressed   Thought Process  Thought Processes: Coherent; Goal Directed; Linear  Descriptions of Associations:Intact  Orientation:Full (Time, Place and Person)  Thought Content:Logical; WDL  Diagnosis of Schizophrenia or Schizoaffective disorder in past: No   Hallucinations:Hallucinations: None  Ideas of Reference:None  Suicidal Thoughts:Suicidal Thoughts: No  Homicidal Thoughts:Homicidal Thoughts: No   Sensorium  Memory: Immediate Good; Recent Good  Judgment: Fair  Insight: Fair   Community education officer  Concentration: Good  Attention Span: Good  Recall: Good  Fund of Knowledge: Good  Language: Good   Psychomotor Activity  Psychomotor Activity: Psychomotor Activity: Normal   Assets  Assets: Communication Skills; Desire for Improvement; Physical Health; Resilience   Sleep  Sleep: Sleep: Good   Nutritional Assessment (For OBS and FBC admissions only) Has the patient had a weight loss or gain of 10 pounds or more in the last 3 months?: No Has the patient had a decrease in food intake/or appetite?: No Does the patient have dental problems?: Yes (reports dental pain x several months) Does the patient have eating habits or behaviors that may be indicators of an eating disorder including binging or inducing vomiting?: No Has the patient  recently lost weight without trying?: 0 Has the patient been eating poorly because of a decreased appetite?: 0 Malnutrition Screening Tool Score: 0    Physical Exam Vitals and nursing note reviewed.  Constitutional:      Appearance: Normal appearance. She is well-developed.  HENT:     Head: Normocephalic and atraumatic.  Cardiovascular:     Rate and Rhythm: Normal rate.  Pulmonary:     Effort: Pulmonary effort is normal.  Musculoskeletal:        General: Normal range of motion.     Cervical back: Normal range of motion.  Skin:    General: Skin is warm and dry.  Neurological:     Mental Status: She is alert and oriented to person, place, and time.  Psychiatric:        Attention and Perception: Attention and perception normal.        Mood and Affect: Affect normal. Mood is depressed.        Speech: Speech normal.        Behavior: Behavior normal. Behavior is cooperative.        Thought Content: Thought content normal.        Cognition and Memory: Cognition and memory normal.   Review of Systems  Constitutional: Negative.   HENT: Negative.         Tooth pain  Eyes: Negative.   Respiratory: Negative.    Cardiovascular: Negative.   Gastrointestinal: Negative.  Genitourinary: Negative.   Musculoskeletal: Negative.   Skin: Negative.   Neurological: Negative.   Psychiatric/Behavioral:  Positive for depression and substance abuse.     Blood pressure (!) 131/96, pulse 88, temperature 98.2 F (36.8 C), temperature source Oral, resp. rate 18, SpO2 99 %. There is no height or weight on file to calculate BMI.  Past Psychiatric History: Cocaine abuse with intoxication, bipolar 1 disorder, anxiety state, substance-induced mood disorder, suicide attempt, schizoaffective disorder, bipolar type  Is the patient at risk to self? No  Has the patient been a risk to self in the past 6 months? No .    Has the patient been a risk to self within the distant past? Yes   Is the patient a  risk to others? No   Has the patient been a risk to others in the past 6 months? No   Has the patient been a risk to others within the distant past? No   Past Medical History: History of cesarean section, bradycardia, hypotension Family History: None reported Social History: Recently homeless, substance use disorder  Last Labs:  No visits with results within 6 Month(s) from this visit.  Latest known visit with results is:  Office Visit on 12/24/2020  Component Date Value Ref Range Status   WBC 12/24/2020 5.9  3.4 - 10.8 x10E3/uL Final   RBC 12/24/2020 4.64  3.77 - 5.28 x10E6/uL Final   Hemoglobin 12/24/2020 13.2  11.1 - 15.9 g/dL Final   Hematocrit 12/24/2020 40.1  34.0 - 46.6 % Final   MCV 12/24/2020 86  79 - 97 fL Final   MCH 12/24/2020 28.4  26.6 - 33.0 pg Final   MCHC 12/24/2020 32.9  31.5 - 35.7 g/dL Final   RDW 12/24/2020 13.8  11.7 - 15.4 % Final   Platelets 12/24/2020 233  150 - 450 x10E3/uL Final   Neutrophils 12/24/2020 52  Not Estab. % Final   Lymphs 12/24/2020 36  Not Estab. % Final   Monocytes 12/24/2020 8  Not Estab. % Final   Eos 12/24/2020 3  Not Estab. % Final   Basos 12/24/2020 1  Not Estab. % Final   Neutrophils Absolute 12/24/2020 3.1  1.4 - 7.0 x10E3/uL Final   Lymphocytes Absolute 12/24/2020 2.1  0.7 - 3.1 x10E3/uL Final   Monocytes Absolute 12/24/2020 0.5  0.1 - 0.9 x10E3/uL Final   EOS (ABSOLUTE) 12/24/2020 0.2  0.0 - 0.4 x10E3/uL Final   Basophils Absolute 12/24/2020 0.0  0.0 - 0.2 x10E3/uL Final   Immature Granulocytes 12/24/2020 0  Not Estab. % Final   Immature Grans (Abs) 12/24/2020 0.0  0.0 - 0.1 x10E3/uL Final   Glucose 12/24/2020 92  65 - 99 mg/dL Final   BUN 12/24/2020 12  6 - 20 mg/dL Final   Creatinine, Ser 12/24/2020 0.78  0.57 - 1.00 mg/dL Final   eGFR 12/24/2020 102  >59 mL/min/1.73 Final   BUN/Creatinine Ratio 12/24/2020 15  9 - 23 Final   Sodium 12/24/2020 136  134 - 144 mmol/L Final   Potassium 12/24/2020 4.5  3.5 - 5.2 mmol/L Final    Chloride 12/24/2020 101  96 - 106 mmol/L Final   Calcium 12/24/2020 8.9  8.7 - 10.2 mg/dL Final   Total Protein 12/24/2020 6.4  6.0 - 8.5 g/dL Final   Albumin 12/24/2020 4.1  3.8 - 4.8 g/dL Final   Globulin, Total 12/24/2020 2.3  1.5 - 4.5 g/dL Final   Albumin/Globulin Ratio 12/24/2020 1.8  1.2 - 2.2 Final   Bilirubin  Total 12/24/2020 0.2  0.0 - 1.2 mg/dL Final   Alkaline Phosphatase 12/24/2020 85  44 - 121 IU/L Final   AST 12/24/2020 18  0 - 40 IU/L Final   TSH 12/24/2020 1.660  0.450 - 4.500 uIU/mL Final   HIV Screen 4th Generation wRfx 12/24/2020 Non Reactive  Non Reactive Final   Comment: HIV Negative HIV-1/HIV-2 antibodies and HIV-1 p24 antigen were NOT detected. There is no laboratory evidence of HIV infection.    HCV Ab 12/24/2020 <0.1  0.0 - 0.9 s/co ratio Final   HCV Interp 1: 12/24/2020 Comment   Final   Comment: Negative Not infected with HCV, unless recent infection is suspected or other evidence exists to indicate HCV infection.     Allergies: Amoxicillin, Codeine, Latex, Penicillins, Prednisone, Augmentin [amoxicillin-pot clavulanate], Lamictal [lamotrigine], and Tramadol  Medications:  Facility Ordered Medications  Medication   acetaminophen (TYLENOL) tablet 650 mg   alum & mag hydroxide-simeth (MAALOX/MYLANTA) 200-200-20 MG/5ML suspension 30 mL   magnesium hydroxide (MILK OF MAGNESIA) suspension 30 mL   hydrOXYzine (ATARAX) tablet 25 mg   traZODone (DESYREL) tablet 50 mg   nicotine (NICODERM CQ - dosed in mg/24 hours) patch 14 mg   PTA Medications  Medication Sig   albuterol (VENTOLIN HFA) 108 (90 Base) MCG/ACT inhaler Inhale 2 puffs into the lungs every 6 (six) hours as needed.   hydrOXYzine (ATARAX/VISTARIL) 10 MG tablet Take 1-2 tabs PO at noon PRN for anxiety   hydrOXYzine (ATARAX/VISTARIL) 50 MG tablet Take 1/2 - 1 full tab PO qhs PRN   docusate sodium (COLACE) 100 MG capsule Take 100 mg by mouth 2 (two) times daily.   doxepin (SINEQUAN) 25 MG capsule  Take 1 capsule (25 mg total) by mouth at bedtime as needed (sleep).   ARIPiprazole (ABILIFY) 5 MG tablet Take 1 tablet (5 mg total) by mouth at bedtime.   gabapentin (NEURONTIN) 300 MG capsule Take 1 capsule (300 mg total) by mouth 3 (three) times daily.   buPROPion (WELLBUTRIN XL) 150 MG 24 hr tablet Take 1 tablet (150 mg total) by mouth every morning.   naltrexone (DEPADE) 50 MG tablet Take 1 tablet (50 mg total) by mouth daily.    Long Term Goals: Improvement in symptoms so as ready for discharge  Short Term Goals: Patient will verbalize feelings in meetings with treatment team members., Patient will attend at least of 50% of the groups daily., Pt will complete the PHQ9 on admission, day 3 and discharge., Patient will participate in completing the [place], Patient will score a low risk of violence for 24 hours prior to discharge, and Patient will take medications as prescribed daily.  Medical Decision Making  Patient remains voluntary.  She will be admitted to facility based crisis unit.  Laboratory studies ordered including CBC, CMP, ethanol, A1c,  lipid panel, magnesium, prolactin and TSH.  Urine pregnancy, urine drug screen ordered.  EKG order initiated.  Labs resulted/reviewed 09/24/2022 UDS- +amphetamine, +cocaine, +methamphetamine, +marijuana Urine pregnancy- negative Respiratory Panel-influenza A and B negative, COVID negative   Current medications: -Acetaminophen 650 mg every 6 as needed/mild pain -Maalox 30 mL oral every 4 as needed/digestion -Hydroxyzine 25 mg 3 times daily as needed/anxiety -Magnesium hydroxide 30 mL daily as needed/mild constipation -Trazodone 50 mg nightly as needed/sleep -Nicotine NicoDerm CQ 14 mg patch transdermal daily/nicotine withdrawal  Home medications initiated: -aripiprazole '5mg'$  daily/mood -benzocaine Orajel 10% mucosal gel 1 application TID PRN/ mouth pain -gabapentin '300mg'$  TID/mood    Recommendations  Based on my evaluation the patient does not appear to have an emergency medical condition.  Lucky Rathke, FNP 09/24/22  5:46 PM

## 2022-09-24 NOTE — ED Notes (Signed)
Patient observed/assessed at bedside in room and stated she did not want to wake up. Patient alert and oriented x 4. Affect is flat.  Patient denies pain and anxiety. He denies A/V/H. He denies having any thoughts/plan of self harm and harm towards others. Fluid and snack offered. Patient states that appetite has been good throughout the day. Verbalizes no further complaints at this time. Will continue to monitor and support.

## 2022-09-24 NOTE — ED Notes (Signed)
Per report from [employer], [place], pt stated "every time that she goes into a detox facility, her BP bottoms."

## 2022-09-25 DIAGNOSIS — F250 Schizoaffective disorder, bipolar type: Secondary | ICD-10-CM | POA: Diagnosis not present

## 2022-09-25 DIAGNOSIS — F1420 Cocaine dependence, uncomplicated: Secondary | ICD-10-CM | POA: Diagnosis not present

## 2022-09-25 DIAGNOSIS — Z1152 Encounter for screening for COVID-19: Secondary | ICD-10-CM | POA: Diagnosis not present

## 2022-09-25 DIAGNOSIS — B974 Respiratory syncytial virus as the cause of diseases classified elsewhere: Secondary | ICD-10-CM | POA: Diagnosis not present

## 2022-09-25 MED ORDER — IBUPROFEN 600 MG PO TABS
600.0000 mg | ORAL_TABLET | Freq: Four times a day (QID) | ORAL | Status: DC | PRN
  Administered 2022-09-25 – 2022-09-26 (×3): 600 mg via ORAL
  Filled 2022-09-25 (×3): qty 1

## 2022-09-25 MED ORDER — POTASSIUM CHLORIDE CRYS ER 20 MEQ PO TBCR
40.0000 meq | EXTENDED_RELEASE_TABLET | ORAL | Status: AC
  Administered 2022-09-25 (×2): 40 meq via ORAL
  Filled 2022-09-25 (×2): qty 2

## 2022-09-25 NOTE — ED Notes (Signed)
Pt is currently sleeping, no distress noted, environmental check complete, will continue to monitor patient for safety. ? ?

## 2022-09-25 NOTE — ED Notes (Signed)
Pt asleep in bed. Respirations even and unlabored. Monitoring for safety. 

## 2022-09-25 NOTE — Progress Notes (Signed)
BHH/BMU LCSW Progress Note   09/25/2022    10:52 AM  LESHAWN EILAND   [id number]   Type of Contact and Topic:  Care Coordination   CSW attempted to reach [facility]missions, left HIPAA compliant voicemail with contact information and callback request. Situation ongoing, CSW will continue to monitor and update note as more information becomes available.    Signed:  Durenda Hurt, MSW, LCSW, LCAS 09/25/2022 10:52 AM

## 2022-09-25 NOTE — ED Notes (Signed)
Pt sleeping in room. RR even and unlabored. No noted distress. Will continue to monitor for safety per facility protocol

## 2022-09-25 NOTE — ED Provider Notes (Signed)
FBC Progress Note  Date and Time: 09/25/2022 11:45 AM Name: Cynthia Odonnell MRN:  [id number]  Reason For Admission: Cynthia Odonnell is a 37 yo female with hx of cocaine use disorder admitted to [facility] seeking substance use detox and residential substance treatment.  Subjective: Patient seen and assessed at bedside.  Patient feels fatigue and dental pain but is not acutely distress. Discussed RSV positive results and policy for managing symptoms and preventing spread. She was agreeable to isolating in room. She states she feels fine. She denies present SI/HI/AVH although states she saw "shadow people" last night. She agrees to continuing abilify and gabapentin. She is unclear of her bipolar disorder history beyond the fact she was diagnosed with it at age 25.   Diagnosis:  Final diagnoses:  Cocaine use disorder, severe, dependence ([place])  Bipolar 1 disorder ([facility])  Respiratory syncytial virus    Total Time spent with patient: 45 minutes   Labs  Lab Results:     Latest Ref Rng & Units 09/24/2022    5:31 PM 12/24/2020   11:21 AM 06/01/2020    6:30 PM  CBC  WBC 4.0 - 10.5 K/uL 8.6  5.9  6.4   Hemoglobin 12.0 - 15.0 g/dL 14.2  13.2  15.0   Hematocrit 36.0 - 46.0 % 41.1  40.1  43.7   Platelets 150 - 400 K/uL 305  233  309       Latest Ref Rng & Units 09/24/2022    5:31 PM 12/24/2020   11:21 AM 06/01/2020    6:30 PM  CMP  Glucose 70 - 99 mg/dL 92  92  112   BUN 6 - 20 mg/dL '8  12  9   '$ Creatinine 0.44 - 1.00 mg/dL 0.71  0.78  1.05   Sodium 135 - 145 mmol/L 137  136  138   Potassium 3.5 - 5.1 mmol/L 2.9  4.5  3.4   Chloride 98 - 111 mmol/L 102  101  106   CO2 22 - 32 mmol/L 24   20   Calcium 8.9 - 10.3 mg/dL 9.0  8.9  9.2   Total Protein 6.5 - 8.1 g/dL 6.6  6.4  6.3   Total Bilirubin 0.3 - 1.2 mg/dL 0.8  0.2  0.8   Alkaline Phos 38 - 126 U/L 61  85  60   AST 15 - 41 U/L '14  18  12   '$ ALT 0 - 44 U/L 10   13     Physical Findings   AIMS    Flowsheet Row Admission (Discharged) from OP  Visit from 04/25/2019 in [place] Admission (Discharged) from OP Visit from 04/15/2019 in [place] [facility] Admission (Discharged) from 03/05/2018 in [place] [facility]  AIMS Total Score 0 0 0      AUDIT    Flowsheet Row Admission (Discharged) from OP Visit from 04/25/2019 in [place] Admission (Discharged) from OP Visit from 04/15/2019 in [place] [facility]  Alcohol Use Disorder Identification Test Final Score (AUDIT) 0 23      GAD-7    Flowsheet Row Office Visit from 12/24/2020 in [place] 1  Total GAD-7 Score 17      PHQ2-9    Flowsheet Row ED from 09/24/2022 in [facility] Office Visit from 12/24/2020 in [place] 1 US OB FOLLOW UP ADD'L GEST [facility]  Outpatient Ultrasound US OB FOLLOW UP ADD'L GEST [facility]ound US OB FOLLOW UP ADD'L GEST from 09/06/2014 [facility]  PHQ-2 Total Score 4 4 0 0 0  PHQ-9 Total Score 15 15 -- -- --      Flowsheet Row ED from 09/24/2022 in [facility] OP Visit from 05/21/2021 in [place] Admission (Discharged) from OP Visit from 04/25/2019 in [place] CATEGORY Moderate Risk No Risk Error: Q3, 4, or 5 should not be populated when Q2 is No        Musculoskeletal  Strength & Muscle Tone: within normal limits Gait & Station: normal Patient leans: N/A  Psychiatric Specialty Exam  Presentation  General Appearance:  Appropriate for Environment; Casual   Eye Contact: Good   Speech: Clear and Coherent; Normal Rate   Speech Volume: Normal   Handedness: Right    Mood and Affect  Mood: Depressed   Affect: Depressed    Thought Process   Thought Processes: Coherent; Goal Directed; Linear   Descriptions of Associations:Intact   Orientation:Full (Time, Place and Person)   Thought Content:Logical; WDL   Diagnosis of Schizophrenia or Schizoaffective disorder in past: No     Hallucinations:Hallucinations: None   Ideas of Reference:None   Suicidal Thoughts:Suicidal Thoughts: No   Homicidal Thoughts:Homicidal Thoughts: No    Sensorium  Memory: Immediate Good; Recent Good   Judgment: Fair   Insight: Fair    Community education officer  Concentration: Good   Attention Span: Good   Recall: Good   Fund of Knowledge: Good   Language: Good    Psychomotor Activity  Psychomotor Activity: Psychomotor Activity: Normal    Assets  Assets: Communication Skills; Desire for Improvement; Physical Health; Resilience    Sleep  Sleep: Sleep: Good    Physical Exam  Physical Exam Vitals and nursing note reviewed.  Constitutional:      General: She is not in acute distress.    Appearance: She is well-developed.  HENT:     Head: Normocephalic and atraumatic.     Mouth/Throat:     Comments: Poor dentition in left upper and lower teeth Eyes:     Conjunctiva/sclera: Conjunctivae normal.  Cardiovascular:     Rate and Rhythm: Normal rate.     Heart sounds: No murmur heard. Pulmonary:     Effort: Pulmonary effort is normal. No respiratory distress.  Abdominal:     Tenderness: There is no abdominal tenderness.  Musculoskeletal:        General: No swelling.     Cervical back: Neck supple.  Neurological:     Mental Status: She is alert.   Review of Systems  Respiratory:  Negative for shortness of breath.   Cardiovascular:  Negative for chest pain.  Gastrointestinal:  Negative for abdominal pain, constipation, diarrhea, heartburn, nausea and vomiting.  Neurological:  Negative for headaches.  Psychiatric/Behavioral:  Positive for depression and substance abuse. Negative for  hallucinations, memory loss and suicidal ideas. The patient is nervous/anxious and has insomnia.    Blood pressure (!) 142/78, pulse 75, temperature 97.9 F (36.6 C), temperature source Oral, resp. rate 18, SpO2 96 %. There is no height or weight on file to calculate BMI.  Cynthia Odonnell is a 37 yo female with hx of cocaine use disorder admitted to [facility] seeking substance use detox and residential substance treatment.   PLAN Stimulant use disorder-cocaine Hx  of bipolar disorder -Continue abilify 5 mg daily for mood lability -Continue gabapentin 300 mg tid for anxiety and "mood stability"  RSV -supportive management -airborne and contact precautions  Dispo residential substance use treatment   Cynthia Ravens, MD 09/25/2022 11:45 AM

## 2022-09-25 NOTE — ED Notes (Signed)
Patient lying in bed, still has complaint of teeth pain.

## 2022-09-25 NOTE — ED Notes (Signed)
Pt sleeping in room, easily awakened. A/O x4. Denies SI/HI/AVH. She voices withdrawal symptoms, states She has thoughts of using and some craving. She is comfortable at the moment. Skin w/d. No noted distress. Will continue to monitor for safety per facility protocol.

## 2022-09-25 NOTE — Progress Notes (Signed)
[facility]/BMU/FBC LCSW Progress Note   09/25/2022    3:03 PM  DAWT CIRELLO   [id number]   Type of Contact and Topic:  Care Coordination   CSW attempted to reach [place], [facility] admissions, to discuss referral. Left HIPAA compliant voicemail with contact information and callback request. Situation ongoing, CSW will continue to monitor and update note as more information becomes available.      Signed:  Durenda Hurt, MSW, LCSW, LCAS 09/25/2022 3:03 PM

## 2022-09-25 NOTE — Treatment Plan (Signed)
B[facility]/BMU LCSW Progress Note   09/25/2022    9:46 AM  Cynthia Odonnell   [id number]   Type of Contact and Topic:  Initial Contact    CSW met with patient at bedside to discus goals/outcomes of treatment.   Patient is a 37 y/o female w/ dx of stimulant disorder (cocaine/methamphetamine) use d/o severe from [facility] w/ [employer]. During assessment patient states she would like to  attend residential treatment, specifically at [facility]. Patient has last attended this facility in 2021.  CSW to reach Donata Duff, admissions coordinator at [facility] treatment center. Patient's has [employer] which may be exclusionary for admission to facility. When asked about medicaid, patient states she has not had medicaid in 7-8 years. Per chart review, [employer] was last E-Verified on 09/04/2022  Patient presents as calm, cooperative, and polite. Affect is Euthymic, congruent with mood and context. Appearance is relatively WNL. Speech volume, speed, and content is WNL. No evidence of memory or concentration impairment. Patient oriented to person, place, time, and situation. No evidence of psychotic features present.    Patient is not currently linked with any outpatient mental health services, is agreeable to outpatient referrals. Therapeutic recommendations include further crisis stabilization, medication management, group therapy, and case management.   Signed:  Durenda Hurt, MSW, LCSW, LCAS 09/25/2022 9:46 AM

## 2022-09-25 NOTE — ED Notes (Signed)
SPIRITUALITY GROUP NOTE  Spirituality group facilitated by Simone Curia, MDiv, [place].  Group Description:  Group focused on topic of hope.  Patients participated in facilitated discussion around topic, connecting with one another around experiences and definitions for hope.  Group members engaged with visual explorer photos, reflecting on what hope looks like for them today.  Group engaged in discussion around how their definitions of hope are present today in hospital.   Modalities: Psycho-social ed, Adlerian, Narrative, MI Patient Progress: Did not attend

## 2022-09-26 DIAGNOSIS — Z1152 Encounter for screening for COVID-19: Secondary | ICD-10-CM | POA: Diagnosis not present

## 2022-09-26 DIAGNOSIS — F250 Schizoaffective disorder, bipolar type: Secondary | ICD-10-CM | POA: Diagnosis not present

## 2022-09-26 DIAGNOSIS — B974 Respiratory syncytial virus as the cause of diseases classified elsewhere: Secondary | ICD-10-CM | POA: Diagnosis not present

## 2022-09-26 DIAGNOSIS — F1420 Cocaine dependence, uncomplicated: Secondary | ICD-10-CM | POA: Diagnosis not present

## 2022-09-26 MED ORDER — CLINDAMYCIN HCL 150 MG PO CAPS
300.0000 mg | ORAL_CAPSULE | Freq: Two times a day (BID) | ORAL | Status: DC
  Administered 2022-09-26 – 2022-09-27 (×3): 300 mg via ORAL
  Filled 2022-09-26 (×3): qty 2

## 2022-09-26 NOTE — ED Notes (Signed)
Patient could not attend the group session

## 2022-09-26 NOTE — ED Provider Notes (Addendum)
FBC Progress Note  Date and Time: 09/26/2022 2:18 PM Name: Cynthia Odonnell MRN:  [id number]  Reason For Admission: Cynthia Odonnell is a 37 yo female with hx of cocaine use disorder admitted to [facility] seeking substance use detox and residential substance treatment.  Subjective: Patient seen and assessed at bedside.  She continues to do well overall and denies any major symptoms of RSV.  She does endorse continued dental pain which does affect her sleep at times.  She was agreeable to starting antibiotics which she has not taken since last time she had been seen in the ED.  She also states she will attempt to use the Orajel to help with pain. She denies SI/HI/AVH and contracts for safety.  She continues to be interested in attending residential rehab.  Diagnosis:  Final diagnoses:  Cocaine use disorder, severe, dependence ([place])  Bipolar 1 disorder (HCC)  Respiratory syncytial virus    Total Time spent with patient: 45 minutes   Labs  Lab Results:     Latest Ref Rng & Units 09/24/2022    5:31 PM 12/24/2020   11:21 AM 06/01/2020    6:30 PM  CBC  WBC 4.0 - 10.5 K/uL 8.6  5.9  6.4   Hemoglobin 12.0 - 15.0 g/dL 14.2  13.2  15.0   Hematocrit 36.0 - 46.0 % 41.1  40.1  43.7   Platelets 150 - 400 K/uL 305  233  309       Latest Ref Rng & Units 09/24/2022    5:31 PM 12/24/2020   11:21 AM 06/01/2020    6:30 PM  CMP  Glucose 70 - 99 mg/dL 92  92  112   BUN 6 - 20 mg/dL '8  12  9   '$ Creatinine 0.44 - 1.00 mg/dL 0.71  0.78  1.05   Sodium 135 - 145 mmol/L 137  136  138   Potassium 3.5 - 5.1 mmol/L 2.9  4.5  3.4   Chloride 98 - 111 mmol/L 102  101  106   CO2 22 - 32 mmol/L 24   20   Calcium 8.9 - 10.3 mg/dL 9.0  8.9  9.2   Total Protein 6.5 - 8.1 g/dL 6.6  6.4  6.3   Total Bilirubin 0.3 - 1.2 mg/dL 0.8  0.2  0.8   Alkaline Phos 38 - 126 U/L 61  85  60   AST 15 - 41 U/L '14  18  12   '$ ALT 0 - 44 U/L 10   13     Physical Findings   AIMS    Flowsheet Row Admission (Discharged) from OP Visit  from 04/25/2019 in [place] Admission (Discharged) from OP Visit from 04/15/2019 in [place] [facility] Admission (Discharged) from 03/05/2018 in [place] [facility]  AIMS Total Score 0 0 0      AUDIT    Flowsheet Row Admission (Discharged) from OP Visit from 04/25/2019 in [place] Admission (Discharged) from OP Visit from 04/15/2019 in [place] [facility]  Alcohol Use Disorder Identification Test Final Score (AUDIT) 0 23      GAD-7    Flowsheet Row Office Visit from 12/24/2020 in [place] [facility]  Total GAD-7 Score 17      PHQ2-9    Flowsheet Row ED from 09/24/2022 in [facility] Office Visit from 12/24/2020 in [place] [facility] US OB FOLLOW UP ADD'L  GEST from [facility]/2014 [facility] OB FOLLOW UP ADD'L GEST from 09/27/2014 [facility] OB FOLLOW UP ADD'L GEST from 09/06/2014 [facility]  PHQ-2 Total Score 4 4 0 0 0  PHQ-9 Total Score 15 15 -- -- --      Flowsheet Row ED from 09/24/2022 in [facility] OP Visit from 05/21/2021 in [place] Admission (Discharged) from OP Visit from 04/25/2019 in [place] CATEGORY Moderate Risk No Risk Error: Q3, 4, or 5 should not be populated when Q2 is No        Musculoskeletal  Strength & Muscle Tone: within normal limits Gait & Station: normal Patient leans: N/A  Psychiatric Specialty Exam  Presentation  General Appearance:  Appropriate for Environment; Casual   Eye Contact: Good   Speech: Clear and Coherent; Normal Rate   Speech Volume: Normal   Handedness: Right    Mood and Affect  Mood: Depressed   Affect: Depressed    Thought Process  Thought  Processes: Coherent; Goal Directed; Linear   Descriptions of Associations:Intact   Orientation:Full (Time, Place and Person)   Thought Content:Logical; WDL   Diagnosis of Schizophrenia or Schizoaffective disorder in past: No     Hallucinations:No data recorded   Ideas of Reference:None   Suicidal Thoughts:No data recorded   Homicidal Thoughts:No data recorded    Sensorium  Memory: Immediate Good; Recent Good   Judgment: Fair   Insight: Fair    Community education officer  Concentration: Good   Attention Span: Good   Recall: Good   Fund of Knowledge: Good   Language: Good    Psychomotor Activity  Psychomotor Activity: No data recorded    Assets  Assets: Communication Skills; Desire for Improvement; Physical Health; Resilience    Sleep  Sleep: No data recorded    Physical Exam  Physical Exam Vitals and nursing note reviewed.  Constitutional:      General: She is not in acute distress.    Appearance: She is well-developed.  HENT:     Head: Normocephalic and atraumatic.     Mouth/Throat:     Comments: Poor dentition in left upper and lower teeth Eyes:     Conjunctiva/sclera: Conjunctivae normal.  Cardiovascular:     Rate and Rhythm: Normal rate.     Heart sounds: No murmur heard. Pulmonary:     Effort: Pulmonary effort is normal. No respiratory distress.  Abdominal:     Tenderness: There is no abdominal tenderness.  Musculoskeletal:        General: No swelling.     Cervical back: Neck supple.  Neurological:     Mental Status: She is alert.    Review of Systems  Respiratory:  Negative for shortness of breath.   Cardiovascular:  Negative for chest pain.  Gastrointestinal:  Negative for abdominal pain, constipation, diarrhea, heartburn, nausea and vomiting.  Neurological:  Negative for headaches.  Psychiatric/Behavioral:  Positive for depression and substance abuse. Negative for hallucinations, memory loss and suicidal  ideas. The patient is nervous/anxious and has insomnia.    Blood pressure (!) 133/91, pulse (!) 59, temperature 99.4 F (37.4 C), resp. rate 17, SpO2 99 %. There is no height or weight on file to calculate BMI.  ASSESSMENT Cynthia Odonnell is a 37 yo female with hx of cocaine use disorder admitted to [facility] seeking substance use detox and residential substance  treatment.   Starting clindamycin 300 mg twice daily for 7 days given concern for possible pulpitis.  Patient has no acute questions at this time was agreeable to this plan as well as continuing social isolation secondary to RSV.  PLAN Stimulant use disorder-cocaine Hx of bipolar disorder -Continue abilify 5 mg daily for mood lability -Continue gabapentin 300 mg tid for anxiety and "mood stability"  RSV -supportive management -airborne and contact precautions  Pulpitis Dentalgia Poor dentition -Start clindamycin 300 mg every 12 hours for 7 days (to complete on 10/02/22)  [facility] residential substance use treatment   France Ravens, MD 09/26/2022 2:18 PM

## 2022-09-26 NOTE — ED Notes (Signed)
Pt is sleeping. No distress noted. Will continue to monitor safety.

## 2022-09-26 NOTE — ED Notes (Signed)
Pt is in the bed sleeping. Respirations are even and unlabored. No acute distress noted. Will continue to monitor for safety. 

## 2022-09-26 NOTE — ED Notes (Signed)
Patient tearful with thoughts of wanting to use.  RN spoke with her at length and patient eventually reported feeling better.  She took a shower, was given clean clothes, motrin and orajel.  Patient also given a snack, juice and some coloring pencils and pages.  She is brighter and reports decreased thoughts of using.  Will continue to monitor and provide education regarding substance use and the recovery process.

## 2022-09-26 NOTE — ED Notes (Signed)
Patient remains on isolation precautions due to RSV.  Patient given breakfast in room.  No complaints at this time.  Patient sad but calm and denies avh shi or plan.  Will continue to monitor and provide support as needed.

## 2022-09-26 NOTE — ED Notes (Signed)
Patient calm and pleasant.  She remains isolated in room due to RSV.  No complaints or distress.  Lunch brought to her.  Will monitor and provide safety and support as needed.

## 2022-09-27 DIAGNOSIS — F1420 Cocaine dependence, uncomplicated: Secondary | ICD-10-CM | POA: Diagnosis not present

## 2022-09-27 DIAGNOSIS — B974 Respiratory syncytial virus as the cause of diseases classified elsewhere: Secondary | ICD-10-CM | POA: Diagnosis not present

## 2022-09-27 DIAGNOSIS — F250 Schizoaffective disorder, bipolar type: Secondary | ICD-10-CM | POA: Diagnosis not present

## 2022-09-27 DIAGNOSIS — Z1152 Encounter for screening for COVID-19: Secondary | ICD-10-CM | POA: Diagnosis not present

## 2022-09-27 LAB — PROLACTIN: Prolactin: 9.2 ng/mL (ref 4.8–33.4)

## 2022-09-27 MED ORDER — GABAPENTIN 300 MG PO CAPS: 300.0000 mg | ORAL_CAPSULE | Freq: Three times a day (TID) | ORAL | 0 refills | Status: DC

## 2022-09-27 MED ORDER — NICOTINE 14 MG/24HR TD PT24: 14.0000 mg | MEDICATED_PATCH | Freq: Every day | TRANSDERMAL | 0 refills | Status: DC

## 2022-09-27 MED ORDER — ARIPIPRAZOLE 5 MG PO TABS: 5.0000 mg | ORAL_TABLET | Freq: Every day | ORAL | 0 refills | Status: DC

## 2022-09-27 MED ORDER — BENZOCAINE 10 % MT GEL: 1.0000 | OROMUCOSAL | Status: DC | PRN

## 2022-09-27 MED ORDER — CLINDAMYCIN HCL 300 MG PO CAPS: 300.0000 mg | ORAL_CAPSULE | Freq: Two times a day (BID) | ORAL | 0 refills | Status: DC

## 2022-09-27 NOTE — ED Notes (Signed)
Pt is in the bed sleeping. Respirations are even and unlabored. No acute distress noted. Will continue to monitor for safety. 

## 2022-09-27 NOTE — ED Provider Notes (Signed)
FBC/OBS ASAP Discharge Summary  Date and Time: 09/27/2022 12:08 PM  Name: Cynthia Odonnell  MRN:  [id number]   Discharge Diagnoses:  Final diagnoses:  Cocaine use disorder, severe, dependence ([place])  Bipolar 1 disorder ([place])  Respiratory syncytial virus    Subjective:  Cynthia Odonnell is a 37 yo female with hx of cocaine use disorder admitted to [facility] seeking substance use detox and residential substance treatment, she was admitted on 2/21.   She reports that she would like to to discharge today.  She reports that the first reason is because she was not paid today and wants to cancel subscriptions so they do not get withdrawn from her bank account.  She also reports that due to her RSD she cannot stand being isolated to her room anymore.  Discussed with her the risk of relapse by leaving early and she reported understanding.  Discussed with her the importance of filling and completing her antibiotic regimen and she reported understanding.  She reports no SI, HI, or AH.  She reports having VH of shadows which are profiles of faces, she reports they are non distressing.  She reports sleep is good.  She reports her appetite is doing good.  She reports no other concerns at present.  Stay Summary:  She presented to [facility] requesting detox from cocaine on 2/21.  She was admitted to [facility] on 2/21.  She was started on Abilify for Bipolar Disorder.  She was found to be hypokalemic so had repletion.  She had significant dental pain so was started on Clindamycin.  She was found to be RSV positive and so had to remain in isolation in her room.  She requested discharge on 2/24, as there were no safety concerns she was discharged.  Total Time spent with patient: 20 minutes  Past Psychiatric History: Cocaine abuse with intoxication, bipolar 1 disorder, anxiety state, substance-induced mood disorder, suicide attempt, schizoaffective disorder, bipolar type  Past Medical History: History of cesarean section, bradycardia,  hypotension  Family History: None reported  Family Psychiatric History: None reported  Social History: Recently homeless, substance use disorder  Tobacco Cessation:  A prescription for an FDA-approved tobacco cessation medication provided at discharge  Current Medications:  Current Facility-Administered Medications  Medication Dose Route Frequency Provider Last Rate Last Admin   alum & mag hydroxide-simeth (MAALOX/MYLANTA) 200-200-20 MG/5ML suspension 30 mL  30 mL Oral Q4H PRN Lucky Rathke, FNP       ARIPiprazole (ABILIFY) tablet 5 mg  5 mg Oral Daily Lucky Rathke, FNP   5 mg at 09/27/22 0944   benzocaine (ORAJEL) 10 % mucosal gel 1 Application  1 Application Mouth/Throat TID PRN Lucky Rathke, FNP   1 Application at [id number]   clindamycin (CLEOCIN) capsule 300 mg  300 mg Oral Q12H France Ravens, MD   300 mg at 09/27/22 0944   gabapentin (NEURONTIN) capsule 300 mg  300 mg Oral TID Lucky Rathke, FNP   300 mg at 09/27/22 0944   hydrOXYzine (ATARAX) tablet 25 mg  25 mg Oral TID PRN Lucky Rathke, FNP   25 mg at 09/25/22 2156   ibuprofen (ADVIL) tablet 600 mg  600 mg Oral Q6H PRN Lucky Rathke, FNP   600 mg at 09/26/22 1814   magnesium hydroxide (MILK OF MAGNESIA) suspension 30 mL  30 mL Oral Daily PRN Lucky Rathke, FNP       nicotine (NICODERM CQ - dosed in mg/24 hours) patch 14 mg  14 mg Transdermal Daily Lucky Rathke, FNP   14 mg at 09/26/22 [id number]   traZODone (DESYREL) tablet 50 mg  50 mg Oral QHS PRN Lucky Rathke, FNP   50 mg at 09/25/22 2156   Current Outpatient Medications  Medication Sig Dispense Refill   [START ON 09/28/2022] ARIPiprazole (ABILIFY) 5 MG tablet Take 1 tablet (5 mg total) by mouth daily. 30 tablet 0   benzocaine (ORAJEL) 10 % mucosal gel Use as directed 1 Application in the mouth or throat as needed for mouth pain. 5.3 g    clindamycin (CLEOCIN) 300 MG capsule Take 1 capsule (300 mg total) by mouth every 12 (twelve) hours. 12 capsule 0   gabapentin (NEURONTIN) 300 MG  capsule Take 1 capsule (300 mg total) by mouth 3 (three) times daily. 90 capsule 0   [START ON 09/28/2022] nicotine (NICODERM CQ - DOSED IN MG/24 HOURS) 14 mg/24hr patch Place 1 patch (14 mg total) onto the skin daily. 14 patch 0    PTA Medications:  PTA Medications  Medication Sig   clindamycin (CLEOCIN) 300 MG capsule Take 1 capsule (300 mg total) by mouth every 12 (twelve) hours.   [START ON 09/28/2022] ARIPiprazole (ABILIFY) 5 MG tablet Take 1 tablet (5 mg total) by mouth daily.   [START ON 09/28/2022] nicotine (NICODERM CQ - DOSED IN MG/24 HOURS) 14 mg/24hr patch Place 1 patch (14 mg total) onto the skin daily.   gabapentin (NEURONTIN) 300 MG capsule Take 1 capsule (300 mg total) by mouth 3 (three) times daily.   benzocaine (ORAJEL) 10 % mucosal gel Use as directed 1 Application in the mouth or throat as needed for mouth pain.   Facility Ordered Medications  Medication   alum & mag hydroxide-simeth (MAALOX/MYLANTA) 200-200-20 MG/5ML suspension 30 mL   magnesium hydroxide (MILK OF MAGNESIA) suspension 30 mL   hydrOXYzine (ATARAX) tablet 25 mg   traZODone (DESYREL) tablet 50 mg   nicotine (NICODERM CQ - dosed in mg/24 hours) patch 14 mg   gabapentin (NEURONTIN) capsule 300 mg   ARIPiprazole (ABILIFY) tablet 5 mg   benzocaine (ORAJEL) 10 % mucosal gel 1 Application   [COMPLETED] potassium chloride SA (KLOR-CON M) CR tablet 40 mEq   ibuprofen (ADVIL) tablet 600 mg   clindamycin (CLEOCIN) capsule 300 mg       09/27/2022   11:27 AM 09/24/2022    6:16 PM 12/24/2020   10:27 AM  Depression screen PHQ 2/9  Decreased Interest 0 2 2  Down, Depressed, Hopeless 0 2 2  PHQ - 2 Score 0 4 4  Altered sleeping 0 1 3  Tired, decreased energy 0 1 1  Change in appetite 0 1 3  Feeling bad or failure about yourself  0 2 2  Trouble concentrating 0 2 2  Moving slowly or fidgety/restless 0 3 0  Suicidal thoughts 0 1 0  PHQ-9 Score 0 15 15  Difficult doing work/chores  Somewhat difficult      Flowsheet Row ED from 09/24/2022 in [facility] OP Visit from 05/21/2021 in [place] Admission (Discharged) from OP Visit from 04/25/2019 in [place] CATEGORY Moderate Risk No Risk Error: Q3, 4, or 5 should not be populated when Q2 is No       Musculoskeletal  Strength & Muscle Tone: within normal limits Gait & Station: normal Patient leans: N/A  Psychiatric Specialty Exam  Presentation  General Appearance:  Appropriate for Environment;  Casual  Eye Contact: Good  Speech: Clear and Coherent; Normal Rate  Speech Volume: Normal  Handedness: Right   Mood and Affect  Mood: -- ("ok")  Affect: Congruent   Thought Process  Thought Processes: Coherent; Goal Directed  Descriptions of Associations:Intact  Orientation:Full (Time, Place and Person)  Thought Content:Logical; WDL  Diagnosis of Schizophrenia or Schizoaffective disorder in past: No    Hallucinations:Hallucinations: Visual Description of Visual Hallucinations: shadows are profiles of faces, nondistressing  Ideas of Reference:None  Suicidal Thoughts:Suicidal Thoughts: No  Homicidal Thoughts:Homicidal Thoughts: No   Sensorium  Memory: Immediate Fair; Recent Fair  Judgment: Intact  Insight: Present   Executive Functions  Concentration: Good  Attention Span: Good  Recall: Good  Fund of Knowledge: Good  Language: Good   Psychomotor Activity  Psychomotor Activity: Psychomotor Activity: Normal   Assets  Assets: Communication Skills; Desire for Improvement; Resilience; Physical Health   Sleep  Sleep: Sleep: Good   No data recorded  Physical Exam  Physical Exam Vitals and nursing note reviewed.  Constitutional:      General: She is not in acute distress.    Appearance: Normal appearance. She is normal weight. She is not ill-appearing, toxic-appearing or  diaphoretic.  HENT:     Head: Normocephalic and atraumatic.  Musculoskeletal:        General: Normal range of motion.  Neurological:     General: No focal deficit present.     Mental Status: She is alert.    Review of Systems  Respiratory:  Negative for cough and shortness of breath.   Cardiovascular:  Negative for chest pain.  Gastrointestinal:  Negative for abdominal pain, constipation, diarrhea, nausea and vomiting.  Neurological:  Negative for dizziness, weakness and headaches.  Psychiatric/Behavioral:  Positive for hallucinations (VH- shadows are profiles of faces, nondistressing). Negative for depression and suicidal ideas. The patient is not nervous/anxious.    Blood pressure 134/86, pulse 61, temperature 98.4 F (36.9 C), temperature source Oral, resp. rate 18, SpO2 99 %. There is no height or weight on file to calculate BMI.  Demographic Factors:  Divorced or widowed, Caucasian, Low socioeconomic status, and Unemployed  Loss Factors: Financial problems/change in socioeconomic status  Historical Factors: Prior suicide attempts and Impulsivity  Risk Reduction Factors:   Sense of responsibility to family  Continued Clinical Symptoms:  Alcohol/Substance Abuse/Dependencies Unstable or Poor Therapeutic Relationship  Cognitive Features That Contribute To Risk:  Thought constriction (tunnel vision)    Suicide Risk:  Mild:   There are no identifiable plans, no associated intent, mild dysphoria and related symptoms, good self-control (both objective and subjective assessment), few other risk factors, and identifiable protective factors, including her children and her faith, however, she has attempted in the past so there is some risk present.  Plan Of Care/Follow-up recommendations/Disposition:  Activity: as tolerated  Diet: heart healthy  Other: -Follow-up with your outpatient psychiatric provider -instructions on appointment date, time, and address (location) are  provided to you in discharge paperwork.  -Take your psychiatric medications as prescribed at discharge - instructions are provided to you in the discharge paperwork  -Follow-up with outpatient primary care doctor and other specialists -for management of chronic medical disease, including: dental pain  -Testing: Follow-up with outpatient provider for abnormal lab results: Recheck Potassium as it was repleted to ensure it stays WNL.  -Recommend abstinence from alcohol, tobacco, and other illicit drug use at discharge.   -If your psychiatric symptoms recur, worsen, or if you have side effects to your  psychiatric medications, call your outpatient psychiatric provider, [phone] or go to the nearest emergency department.  -If suicidal thoughts recur, call your outpatient psychiatric provider, 911, [phone] or go to the nearest emergency department.   Briant Cedar, MD 09/27/2022, 12:08 PM

## 2022-09-27 NOTE — ED Notes (Signed)
Patient remains calm and cooperative with care.  She is without withdrawal and remains isolated in her room due to RSV.  Patient encouraged to make her needs known.  Staff has been checking on patient regularly and attempting to anticipate and meet needs.  Denies avh shi or plan.  Will monitor.

## 2022-09-27 NOTE — Discharge Instructions (Addendum)
Substance Abuse Resources  [facility] - Admissions are currently completed Monday through Friday at [facility]; both appointments and walk-ins are accepted.  Any individual that is a [facility] resident may present for a substance abuse screening and assessment for admission.  A person may be referred by numerous sources or self-refer.   Potential clients will be screened for medical necessity and appropriateness for the program.  Clients must meet criteria for high-intensity residential treatment services.  If clinically appropriate, a client will continue with the comprehensive clinical assessment and intake process, as well as enrollment in the [facility].   Address: [address] [place], [place] [address] Admin Hours: Mon-Fri 8AM to [place] Hours: 24/7 Phone: [phone] Fax: [phone]   [facility] (Detox) Facility Based Crisis:  These are 3 locations for services: Please call before arrival    Address: [address] [place], [place] [address] Phone: [phone]   Address: [address] [place], [place] [address] Phone#: [phone]   Address: [address] [place], [place] [address] Phone#: [phone]     [facility]): (offers outpatient therapy and intensive outpatient substance abuse therapy).  [address], [place], [place] [address] Phone: [phone]   [place], [place], [place] [address] Mon-Fri: 5- 10 AM Sat: 6-8:30 AM Phone: [phone]   [facility]: Offers FREE recovery skills classes, support groups, 1:1 Peer Support, and Compeer Classes. [address], [place], [place] [address] Phone: [phone] (Call to complete intake).  [facility] [address] [place], [place] [address] Phone: [phone] ext: [facility] provides food, shelter and other programs and services to the  homeless men of [place] through our [employer].   By offering safe shelter, three meals a day, clean clothing, Biblical counseling, financial planning, vocational training, GED/education and employment assistance, we've helped mend the shattered lives of many homeless men since opening in 1974.   We have approximately 267 beds available, with a max of 312 beds including mats for emergency situations and currently house an average of 270 men a night.   Prospective Client Check-In Information Photo ID Required (State/ Out of State/ [facility]) - if photo ID is not available, clients are required to have a printout of a police/sheriff's criminal history report. Help out with chores around the [place]. No sex offender of any type (pending, charged, registered and/or any other sex related offenses) will be permitted to check in. Must be willing to abide by all rules, regulations, and policies established by the [employer]. The following will be provided - shelter, food, clothing, and biblical counseling. If you or someone you know is in need of assistance at our [facility] shelter in [place], [place], please call [phone] ext. [phone].   [facility]-will provide timely access to mental health services for children and adolescents (4-17) and adults presenting in a mental health crisis. The program is designed for those who need urgent Behavioral Health or Substance Use treatment and are not experiencing a medical crisis that would typically require an emergency room visit.    [place],  [address] Phone: [phone] [email or web address]: Phone#: [phone]   The [facility] SA Intensive Outpatient Program (SAIOP) means structured individual and group addiction activities and services that are provided at an outpatient program designed to assist adult  and adolescent consumers to  begin recovery and learn skills for recovery maintenance. The [facility] program is offered at least 3 hours a day, 3 days a week.SAIOP services shall include a structured program consisting of, but not limited to, the following services: Individual counseling and support; Group counseling and support; Family counseling, training or support; Biochemical assays to identify recent drug use (e.g., urine drug screens); Strategies for relapse prevention to include community and social support systems in treatment; Life skills; Crisis contingency planning; Disease Management; and Treatment support activities that have been adapted or specifically designed for persons with physical disabilities, or persons with co-occurring disorders of mental illness and substance abuse/dependence or mental retardation/developmental disability and substance abuse/dependence. Phone: [facility] [address] [place], [place] [address] Phone: [phone] Admissions team is available 24/7  Phone: [phone]. Fax: [phone]   [facility]     Admissions [address] [place], [place] [address] [phone]    [facility] 24-Hour Call Center: [phone]  Behavioral Health Crisis Line: [phone]    [facility] [address] [place], [place] [address] [phone] OUTPATIENT Walk-in information: Please note, all walk-ins are first come & first serve, with limited number of availability.  Please note that to be eligible for services you must bring: ID or a piece of mail with your name [facility] address  Therapist for therapy:  Monday & Wednesdays: Please ARRIVE at 7:15 AM for registration Will START at 8:00 AM Every 1st & 2nd Friday of the month: Please ARRIVE at 10:15 AM for registration Will START at 1 PM - 5 PM  Psychiatrist for medication management: Monday - Friday:  Please ARRIVE  at 7:15 AM for registration Will START at 8:00 AM  Regretfully, due to limited availability, please be aware that you may not been seen on the same day as walk-in. Please consider making an appoint or try again. Thank you for your patience and understanding.

## 2022-10-10 ENCOUNTER — Encounter (HOSPITAL_COMMUNITY): Payer: Self-pay | Admitting: Emergency Medicine

## 2022-10-10 ENCOUNTER — Ambulatory Visit (HOSPITAL_COMMUNITY)
Admission: EM | Admit: 2022-10-10 | Discharge: 2022-10-12 | Disposition: A | Discharge status: Home / Self Care | Payer: No Typology Code available for payment source | Attending: Family Medicine | Admitting: Family Medicine

## 2022-10-10 DIAGNOSIS — F1494 Cocaine use, unspecified with cocaine-induced mood disorder: Secondary | ICD-10-CM

## 2022-10-10 DIAGNOSIS — F3112 Bipolar disorder, current episode manic without psychotic features, moderate: Secondary | ICD-10-CM | POA: Insufficient documentation

## 2022-10-10 DIAGNOSIS — F3110 Bipolar disorder, current episode manic without psychotic features, unspecified: Secondary | ICD-10-CM

## 2022-10-10 DIAGNOSIS — Z1152 Encounter for screening for COVID-19: Secondary | ICD-10-CM | POA: Insufficient documentation

## 2022-10-10 DIAGNOSIS — F319 Bipolar disorder, unspecified: Secondary | ICD-10-CM

## 2022-10-10 LAB — CBC WITH DIFFERENTIAL/PLATELET
Abs Immature Granulocytes: 0.02 10*3/uL (ref 0.00–0.07)
Basophils Absolute: 0.1 10*3/uL (ref 0.0–0.1)
Basophils Relative: 1 %
Eosinophils Absolute: 0.1 10*3/uL (ref 0.0–0.5)
Eosinophils Relative: 1 %
HCT: 39.7 % (ref 36.0–46.0)
Hemoglobin: 14.1 g/dL (ref 12.0–15.0)
Immature Granulocytes: 0 %
Lymphocytes Relative: 39 %
Lymphs Abs: 2.2 10*3/uL (ref 0.7–4.0)
MCH: 29.6 pg (ref 26.0–34.0)
MCHC: 35.5 g/dL (ref 30.0–36.0)
MCV: 83.2 fL (ref 80.0–100.0)
Monocytes Absolute: 0.5 10*3/uL (ref 0.1–1.0)
Monocytes Relative: 8 %
Neutro Abs: 2.9 10*3/uL (ref 1.7–7.7)
Neutrophils Relative %: 51 %
Platelets: 280 10*3/uL (ref 150–400)
RBC: 4.77 MIL/uL (ref 3.87–5.11)
RDW: 13.1 % (ref 11.5–15.5)
WBC: 5.8 10*3/uL (ref 4.0–10.5)
nRBC: 0 % (ref 0.0–0.2)

## 2022-10-10 LAB — POCT URINE DRUG SCREEN - MANUAL ENTRY (I-SCREEN)
POC Amphetamine UR: NOT DETECTED
POC Buprenorphine (BUP): NOT DETECTED
POC Cocaine UR: POSITIVE — AB
POC Marijuana UR: POSITIVE — AB
POC Methadone UR: NOT DETECTED
POC Methamphetamine UR: NOT DETECTED
POC Morphine: NOT DETECTED
POC Oxazepam (BZO): NOT DETECTED
POC Oxycodone UR: NOT DETECTED
POC Secobarbital (BAR): NOT DETECTED

## 2022-10-10 LAB — COMPREHENSIVE METABOLIC PANEL
ALT: 19 U/L (ref 0–44)
AST: 18 U/L (ref 15–41)
Albumin: 3.5 g/dL (ref 3.5–5.0)
Alkaline Phosphatase: 65 U/L (ref 38–126)
Anion gap: 11 (ref 5–15)
BUN: 12 mg/dL (ref 6–20)
CO2: 24 mmol/L (ref 22–32)
Calcium: 8.9 mg/dL (ref 8.9–10.3)
Chloride: 105 mmol/L (ref 98–111)
Creatinine, Ser: 0.86 mg/dL (ref 0.44–1.00)
GFR, Estimated: >60 mL/min (ref >60)
Glucose, Bld: 89 mg/dL (ref 70–99)
Potassium: 3.5 mmol/L (ref 3.5–5.1)
Sodium: 140 mmol/L (ref 135–145)
Total Bilirubin: 0.5 mg/dL (ref 0.3–1.2)
Total Protein: 6.3 g/dL — ABNORMAL LOW (ref 6.5–8.1)

## 2022-10-10 LAB — POC SARS CORONAVIRUS 2 AG: SARSCOV2ONAVIRUS 2 AG: NEGATIVE

## 2022-10-10 LAB — RESP PANEL BY RT-PCR (RSV, FLU A&B, COVID)  RVPGX2
Influenza A by PCR: NEGATIVE
Influenza B by PCR: NEGATIVE
Resp Syncytial Virus by PCR: NEGATIVE
SARS Coronavirus 2 by RT PCR: NEGATIVE

## 2022-10-10 LAB — POCT PREGNANCY, URINE: Preg Test, Ur: NEGATIVE

## 2022-10-10 LAB — POC URINE PREG, ED: Preg Test, Ur: NEGATIVE

## 2022-10-10 MED ORDER — CLONIDINE HCL 0.1 MG PO TABS: 0.1000 mg | ORAL_TABLET | Freq: Four times a day (QID) | ORAL | Status: DC | PRN

## 2022-10-10 MED ORDER — GABAPENTIN 100 MG PO CAPS
200.0000 mg | ORAL_CAPSULE | Freq: Three times a day (TID) | ORAL | Status: DC
  Administered 2022-10-10 – 2022-10-12 (×6): 200 mg via ORAL
  Filled 2022-10-10 (×6): qty 2

## 2022-10-10 MED ORDER — MAGNESIUM HYDROXIDE 400 MG/5ML PO SUSP: 30.0000 mL | Freq: Every day | ORAL | Status: DC | PRN

## 2022-10-10 MED ORDER — ACETAMINOPHEN 325 MG PO TABS: 650.0000 mg | ORAL_TABLET | Freq: Four times a day (QID) | ORAL | Status: DC | PRN

## 2022-10-10 MED ORDER — CLONIDINE HCL 0.1 MG PO TABS: 0.1000 mg | ORAL_TABLET | Freq: Three times a day (TID) | ORAL | Status: DC | PRN

## 2022-10-10 MED ORDER — BUPROPION HCL ER (SR) 100 MG PO TB12
100.0000 mg | ORAL_TABLET | Freq: Two times a day (BID) | ORAL | Status: DC
  Administered 2022-10-10 – 2022-10-12 (×5): 100 mg via ORAL
  Filled 2022-10-10 (×5): qty 1

## 2022-10-10 MED ORDER — HYDROXYZINE HCL 25 MG PO TABS
50.0000 mg | ORAL_TABLET | Freq: Three times a day (TID) | ORAL | Status: DC | PRN
  Administered 2022-10-10 – 2022-10-11 (×3): 50 mg via ORAL
  Filled 2022-10-10 (×3): qty 2

## 2022-10-10 MED ORDER — ALUM & MAG HYDROXIDE-SIMETH 200-200-20 MG/5ML PO SUSP: 30.0000 mL | ORAL | Status: DC | PRN

## 2022-10-10 MED ORDER — QUETIAPINE FUMARATE 50 MG PO TABS
50.0000 mg | ORAL_TABLET | Freq: Two times a day (BID) | ORAL | Status: DC
  Administered 2022-10-10 – 2022-10-12 (×5): 50 mg via ORAL
  Filled 2022-10-10 (×5): qty 1

## 2022-10-10 MED ORDER — NICOTINE 21 MG/24HR TD PT24: 21.0000 mg | MEDICATED_PATCH | Freq: Once | TRANSDERMAL | Status: DC

## 2022-10-10 MED ORDER — TRAZODONE HCL 50 MG PO TABS: 50.0000 mg | ORAL_TABLET | Freq: Every evening | ORAL | Status: DC | PRN

## 2022-10-10 MED ORDER — CLONIDINE HCL 0.1 MG PO TABS: 0.1000 mg | ORAL_TABLET | Freq: Three times a day (TID) | ORAL | Status: DC

## 2022-10-10 NOTE — ED Notes (Signed)
Pt  admitted to obs  deniesSI/HI/AVH. Calm, cooperative throughout interview process. Skin assessment completed. Oriented to unit. Meal and drink offered. At [place], pt continue to denies SI/HI/AVH. Pt verbally contract for safety. Will monitor for safety.

## 2022-10-10 NOTE — ED Triage Notes (Signed)
Pt presents to [facility] voluntarily accompanied by her friend seeking detox, substance use treatment and medication management. Pt reports her mother died 2 days ago 3/6 and states she is taking it hard. Pt reports being arrested on 2/26 and was released yesterday. Pt states she was arrested for breaking an entering at her mothers home. Pt states she is currently homeless. Pt states she used cocaine $60 worth yesterday and feels like she is coming down from the cocaine now. Pt states while she was in jail they were not giving her the proper medication and she does not feel well. Pt is tangential, stuttering, jumping from one subject to the next. Pt denies SI/HI and AVH.

## 2022-10-10 NOTE — ED Notes (Signed)
Patient refused nicotine patch and reports she does not want it in the future.  Provider notified.

## 2022-10-10 NOTE — ED Notes (Signed)
Patient in bed. Environment is secured. Will continue to monitor for safety.

## 2022-10-10 NOTE — ED Notes (Signed)
[facility] lab courier called to transport labs to [facility] lab 

## 2022-10-10 NOTE — ED Notes (Signed)
Pt sleeping@this time. Breathing even and unlabored. Will continue to monitor for safety 

## 2022-10-10 NOTE — ED Provider Notes (Signed)
[facility] Continuous Assessment Admission H&P  Date: 10/10/22 Patient Name: Cynthia Odonnell MRN: [id number] Chief Complaint: "I need to finish my detox and go to [facility]"  Diagnoses:  Final diagnoses:  Cocaine use with cocaine-induced mood disorder ([place])  Bipolar affective disorder, currently manic, moderate ([place])    HPI: Cynthia Odonnell 37 y.o., female patient presented to [facility] as a walk in, voluntary unaccompanied with complaints of desiring to detox, would like to restart psychiatric medications, and assistance with getting into [facility].  Cynthia Odonnell, 37 y.o., female patient seen face to face by this provider, consulted with Dr. Dwyane Dee; and chart reviewed on 10/10/22.    On evaluation Cynthia Odonnell reports that she was admitted to [facility] 2 weeks ago and requested to be discharged after 4 days of admission due to having RSV and felt uncomfortable and closed in as she was quarantined to her room due to having a respiratory illness and admits she was craving to use cocaine. She reports recent stressors that have caused her to increase her usage of drugs such as CPS took custody of her children (91 and 16 years old), her mother died two days ago, but mother had her arrested for breaking and entering into her home as she put the patient out 3 weeks ago and since that time patient has been homeless. Brent recently was jailed for several days and released two days ago related to breaking and entering charges. She endorses depression due to continued substance dependence. Last smoked crack one day ago. Last drank alcohol one day ago although denies dependence on alcohol.  Patient endorses routine use of  marijuana.  Yarel would like to go to [facility]. She is aware if she doesn't get clean she will permanently lose custody of her children.    During evaluation Cynthia Odonnell is sitting upright in chair an appears severally anxious although in no acute distress.  She is alert, oriented x 4, cooperative  and inattentive.  Her mood is anxious and dysphoric with congruent affect.  She has pressured speech. Objectively patient appears manic although cannot rule out current presentation is unrelated to recent substance use. Patient is able to converse coherently and goal directed thoughts. She denies suicidal or homicidal ideations at present. Patient will be admitted to continuous assessment unit for observation. Patient appears manic and warrants medication management. Will request assistance from LSW with [facility] placement.  Total Time spent with patient: 20 minutes  Musculoskeletal  Strength & Muscle Tone: within normal limits Gait & Station: normal Patient leans: N/A  Psychiatric Specialty Exam  Presentation General Appearance:  Appropriate for Environment; Casual  Eye Contact: Good  Speech: Clear and Coherent; Normal Rate  Speech Volume: Normal  Handedness: Right   Mood and Affect  Mood: -- ("ok")  Affect: Congruent   Thought Process  Thought Processes: Coherent; Goal Directed  Descriptions of Associations:Intact  Orientation:Full (Time, Place and Person)  Thought Content:Logical; WDL  Diagnosis of Schizophrenia or Schizoaffective disorder in past: No   Hallucinations:No data recorded Ideas of Reference:None  Suicidal Thoughts:No data recorded Homicidal Thoughts:No data recorded  Sensorium  Memory: Immediate Fair; Recent Fair  Judgment: Intact  Insight: Present   Executive Functions  Concentration: Good  Attention Span: Good  Recall: Good  Fund of Knowledge: Good  Language: Good   Psychomotor Activity  Psychomotor Activity:No data recorded  Assets  Assets: Communication Skills; Desire for Improvement; Resilience; Physical Health   Sleep  Sleep:Sleep: Fair  Physical Exam Constitutional:      Comments: Disheveled  appearance   HENT:     Head: Normocephalic.  Eyes:     Extraocular Movements: Extraocular movements  intact.     Pupils: Pupils are equal, round, and reactive to light.  Cardiovascular:     Rate and Rhythm: Normal rate.  Pulmonary:     Effort: Pulmonary effort is normal.     Breath sounds: Normal breath sounds.  Musculoskeletal:     Cervical back: Normal range of motion.  Neurological:     General: No focal deficit present.     Mental Status: She is alert.     Motor: Tremor present.     Comments: Generalized body tremors noticed on exam     Review of Systems  Psychiatric/Behavioral:  Positive for depression and substance abuse. Negative for hallucinations, memory loss and suicidal ideas. The patient is nervous/anxious. The patient does not have insomnia.     Blood pressure 115/82, Odonnell 80, temperature 98.2 F (36.8 C), temperature source Oral, resp. rate 18, SpO2 100 %. There is no height or weight on file to calculate BMI.  Past Psychiatric History: See Problem List   Is the patient at risk to self? No  Has the patient been a risk to self in the past 6 months? Yes .    Has the patient been a risk to self within the distant past? Yes   Is the patient a risk to others? No   Has the patient been a risk to others in the past 6 months? No   Has the patient been a risk to others within the distant past? No     Last Labs:  Admission on 10/10/2022  Component Date Value Ref Range Status   WBC 10/10/2022 5.8  4.0 - 10.5 K/uL Final   RBC 10/10/2022 4.77  3.87 - 5.11 MIL/uL Final   Hemoglobin 10/10/2022 14.1  12.0 - 15.0 g/dL Final   HCT 10/10/2022 39.7  36.0 - 46.0 % Final   MCV 10/10/2022 83.2  80.0 - 100.0 fL Final   MCH 10/10/2022 29.6  26.0 - 34.0 pg Final   MCHC 10/10/2022 35.5  30.0 - 36.0 g/dL Final   RDW 10/10/2022 13.1  11.5 - 15.5 % Final   Platelets 10/10/2022 280  150 - 400 K/uL Final   nRBC 10/10/2022 0.0  0.0 - 0.2 % Final   Neutrophils Relative % 10/10/2022 51  % Final   Neutro Abs 10/10/2022 2.9  1.7 - 7.7 K/uL Final   Lymphocytes Relative 10/10/2022 39  %  Final   Lymphs Abs 10/10/2022 2.2  0.7 - 4.0 K/uL Final   Monocytes Relative 10/10/2022 8  % Final   Monocytes Absolute 10/10/2022 0.5  0.1 - 1.0 K/uL Final   Eosinophils Relative 10/10/2022 1  % Final   Eosinophils Absolute 10/10/2022 0.1  0.0 - 0.5 K/uL Final   Basophils Relative 10/10/2022 1  % Final   Basophils Absolute 10/10/2022 0.1  0.0 - 0.1 K/uL Final   Immature Granulocytes 10/10/2022 0  % Final   Abs Immature Granulocytes 10/10/2022 0.02  0.00 - 0.07 K/uL Final   Performed at [facility], [place] [address], [place], [place] [address]   Preg Test, Ur 10/10/2022 Negative  Negative Final   POC Amphetamine UR 10/10/2022 None Detected  NONE DETECTED (Cut Off Level 1000 ng/mL) Final   POC Secobarbital (BAR) 10/10/2022 None Detected  NONE DETECTED (Cut Off Level 300 ng/mL) Final  POC Buprenorphine (BUP) 10/10/2022 None Detected  NONE DETECTED (Cut Off Level 10 ng/mL) Final   POC Oxazepam (BZO) 10/10/2022 None Detected  NONE DETECTED (Cut Off Level 300 ng/mL) Final   POC Cocaine UR 10/10/2022 Positive (A)  NONE DETECTED (Cut Off Level 300 ng/mL) Final   POC Methamphetamine UR 10/10/2022 None Detected  NONE DETECTED (Cut Off Level 1000 ng/mL) Final   POC Morphine 10/10/2022 None Detected  NONE DETECTED (Cut Off Level 300 ng/mL) Final   POC Methadone UR 10/10/2022 None Detected  NONE DETECTED (Cut Off Level 300 ng/mL) Final   POC Oxycodone UR 10/10/2022 None Detected  NONE DETECTED (Cut Off Level 100 ng/mL) Final   POC Marijuana UR 10/10/2022 Positive (A)  NONE DETECTED (Cut Off Level 50 ng/mL) Final   SARSCOV2ONAVIRUS 2 AG 10/10/2022 NEGATIVE  NEGATIVE Final   Comment: (NOTE) SARS-CoV-2 antigen NOT DETECTED.   Negative results are presumptive.  Negative results do not preclude SARS-CoV-2 infection and should not be used as the sole basis for treatment or other patient management decisions, including infection  control decisions, particularly in the presence of clinical signs and   symptoms consistent with COVID-19, or in those who have been in contact with the virus.  Negative results must be combined with clinical observations, patient history, and epidemiological information. The expected result is Negative.  Fact Sheet for Patients: HandmadeRecipes.com.cy  Fact Sheet for Healthcare Providers: FuneralLife.at  This test is not yet approved or cleared by the [place] FDA and  has been authorized for detection and/or diagnosis of SARS-CoV-2 by FDA under an Emergency Use Authorization (EUA).  This EUA will remain in effect (meaning this test can be used) for the duration of  the COV                          ID-19 declaration under Section 564(b)(1) of the Act, 21 U.S.C. section 360bbb-3(b)(1), unless the authorization is terminated or revoked sooner.     Preg Test, Ur 10/10/2022 NEGATIVE  NEGATIVE Final   Comment:        THE SENSITIVITY OF THIS METHODOLOGY IS >24 mIU/mL   Admission on 09/24/2022, Discharged on 09/27/2022  Component Date Value Ref Range Status   SARS Coronavirus 2 by RT PCR 09/24/2022 NEGATIVE  NEGATIVE Final   Influenza A by PCR 09/24/2022 NEGATIVE  NEGATIVE Final   Influenza B by PCR 09/24/2022 NEGATIVE  NEGATIVE Final   Comment: (NOTE) The Xpert Xpress SARS-CoV-2/FLU/RSV plus assay is intended as an aid in the diagnosis of influenza from Nasopharyngeal swab specimens and should not be used as a sole basis for treatment. Nasal washings and aspirates are unacceptable for Xpert Xpress SARS-CoV-2/FLU/RSV testing.  Fact Sheet for Patients: EntrepreneurPulse.com.au  Fact Sheet for Healthcare Providers: IncredibleEmployment.be  This test is not yet approved or cleared by the [place] FDA and has been authorized for detection and/or diagnosis of SARS-CoV-2 by FDA under an Emergency Use Authorization (EUA). This EUA will remain in effect (meaning  this test can be used) for the duration of the COVID-19 declaration under Section 564(b)(1) of the Act, 21 U.S.C. section 360bbb-3(b)(1), unless the authorization is terminated or revoked.     Resp Syncytial Virus by PCR 09/24/2022 POSITIVE (A)  NEGATIVE Final   Comment: (NOTE) Fact Sheet for Patients: EntrepreneurPulse.com.au  Fact Sheet for Healthcare Providers: IncredibleEmployment.be  This test is not yet approved or cleared by the [place] FDA and has been authorized for detection  and/or diagnosis of SARS-CoV-2 by FDA under an Emergency Use Authorization (EUA). This EUA will remain in effect (meaning this test can be used) for the duration of the COVID-19 declaration under Section 564(b)(1) of the Act, 21 U.S.C. section 360bbb-3(b)(1), unless the authorization is terminated or revoked.  Performed at [facility], [place] [address], [place], [place] [address]    WBC 09/24/2022 8.6  4.0 - 10.5 K/uL Final   RBC 09/24/2022 4.81  3.87 - 5.11 MIL/uL Final   Hemoglobin 09/24/2022 14.2  12.0 - 15.0 g/dL Final   HCT 09/24/2022 41.1  36.0 - 46.0 % Final   MCV 09/24/2022 85.4  80.0 - 100.0 fL Final   MCH 09/24/2022 29.5  26.0 - 34.0 pg Final   MCHC 09/24/2022 34.5  30.0 - 36.0 g/dL Final   RDW 09/24/2022 13.4  11.5 - 15.5 % Final   Platelets 09/24/2022 305  150 - 400 K/uL Final   nRBC 09/24/2022 0.0  0.0 - 0.2 % Final   Neutrophils Relative % 09/24/2022 72  % Final   Neutro Abs 09/24/2022 6.1  1.7 - 7.7 K/uL Final   Lymphocytes Relative 09/24/2022 22  % Final   Lymphs Abs 09/24/2022 1.9  0.7 - 4.0 K/uL Final   Monocytes Relative 09/24/2022 5  % Final   Monocytes Absolute 09/24/2022 0.4  0.1 - 1.0 K/uL Final   Eosinophils Relative 09/24/2022 0  % Final   Eosinophils Absolute 09/24/2022 0.0  0.0 - 0.5 K/uL Final   Basophils Relative 09/24/2022 1  % Final   Basophils Absolute 09/24/2022 0.0  0.0 - 0.1 K/uL Final   Immature Granulocytes  09/24/2022 0  % Final   Abs Immature Granulocytes 09/24/2022 0.02  0.00 - 0.07 K/uL Final   Performed at [facility], [place] [address], [place], [place] [address]   Sodium 09/24/2022 137  135 - 145 mmol/L Final   Potassium 09/24/2022 2.9 (L)  3.5 - 5.1 mmol/L Final   Chloride 09/24/2022 102  98 - 111 mmol/L Final   CO2 09/24/2022 24  22 - 32 mmol/L Final   Glucose, Bld 09/24/2022 92  70 - 99 mg/dL Final   Glucose reference range applies only to samples taken after fasting for at least 8 hours.   BUN 09/24/2022 8  6 - 20 mg/dL Final   Creatinine, Ser 09/24/2022 0.71  0.44 - 1.00 mg/dL Final   Calcium 09/24/2022 9.0  8.9 - 10.3 mg/dL Final   Total Protein 09/24/2022 6.6  6.5 - 8.1 g/dL Final   Albumin 09/24/2022 3.6  3.5 - 5.0 g/dL Final   AST 09/24/2022 14 (L)  15 - 41 U/L Final   ALT 09/24/2022 10  0 - 44 U/L Final   Alkaline Phosphatase 09/24/2022 61  38 - 126 U/L Final   Total Bilirubin 09/24/2022 0.8  0.3 - 1.2 mg/dL Final   GFR, Estimated 09/24/2022 >60  >60 mL/min Final   Comment: (NOTE) Calculated using the CKD-EPI Creatinine Equation (2021)    Anion gap 09/24/2022 11  5 - 15 Final   Performed at [facility], [place] [address] [place], [place] [address]   Hgb A1c MFr Bld 09/24/2022 4.4 (L)  4.8 - 5.6 % Final   Comment: (NOTE) Pre diabetes:          5.7%-6.4%  Diabetes:              >6.4%  Glycemic control for   <7.0% adults with diabetes  Mean Plasma Glucose 09/24/2022 79.58  mg/dL Final   Performed at [facility] [address], [place], [place] [address]   Magnesium 09/24/2022 1.9  1.7 - 2.4 mg/dL Final   Performed at [facility] [address] [place], [place] [address]   Alcohol, Ethyl (B) 09/24/2022 <10  <10 mg/dL Final   Comment: (NOTE) Lowest detectable limit for serum alcohol is 10 mg/dL.  For medical purposes only. Performed at [facility], [place] [address], [place], [place] [address]    Cholesterol 09/24/2022 184  0 - 200  mg/dL Final   Triglycerides 09/24/2022 85  <150 mg/dL Final   HDL 09/24/2022 42  >40 mg/dL Final   Total CHOL/HDL Ratio 09/24/2022 4.4  RATIO Final   VLDL 09/24/2022 17  0 - 40 mg/dL Final   LDL Cholesterol 09/24/2022 125 (H)  0 - 99 mg/dL Final   Comment:        Total Cholesterol/HDL:CHD Risk Coronary Heart Disease Risk Table                     Men   Women  1/2 Average Risk   3.4   3.3  Average Risk       5.0   4.4  2 X Average Risk   9.6   7.1  3 X Average Risk  23.4   11.0        Use the calculated Patient Ratio above and the CHD Risk Table to determine the patient's CHD Risk.        ATP III CLASSIFICATION (LDL):  <100     mg/dL   Optimal  100-129  mg/dL   Near or Above                    Optimal  130-159  mg/dL   Borderline  160-189  mg/dL   High  >190     mg/dL   Very High Performed at [facility] [address], [place], [place] [address]    TSH 09/24/2022 0.662  0.350 - 4.500 uIU/mL Final   Comment: Performed by a 3rd Generation assay with a functional sensitivity of <=0.01 uIU/mL. Performed at [facility], [place] [address], [place], [place] [address]    Prolactin 09/24/2022 9.2  4.8 - 33.4 ng/mL Final   Comment: (NOTE) Performed At: [facility] [place], [place] [id number] Rush Farmer MD [id number]    POC Amphetamine UR 09/24/2022 Positive (A)  NONE DETECTED (Cut Off Level 1000 ng/mL) Final   POC Secobarbital (BAR) 09/24/2022 None Detected  NONE DETECTED (Cut Off Level 300 ng/mL) Final   POC Buprenorphine (BUP) 09/24/2022 None Detected  NONE DETECTED (Cut Off Level 10 ng/mL) Final   POC Oxazepam (BZO) 09/24/2022 None Detected  NONE DETECTED (Cut Off Level 300 ng/mL) Final   POC Cocaine UR 09/24/2022 Positive (A)  NONE DETECTED (Cut Off Level 300 ng/mL) Final   POC Methamphetamine UR 09/24/2022 Positive (A)  NONE DETECTED (Cut Off Level 1000 ng/mL) Final   POC Morphine 09/24/2022 None Detected  NONE DETECTED (Cut Off Level  300 ng/mL) Final   POC Methadone UR 09/24/2022 None Detected  NONE DETECTED (Cut Off Level 300 ng/mL) Final   POC Oxycodone UR 09/24/2022 None Detected  NONE DETECTED (Cut Off Level 100 ng/mL) Final   POC Marijuana UR 09/24/2022 Positive (A)  NONE DETECTED (Cut Off Level 50 ng/mL) Final   Preg  Test, Ur 09/24/2022 NEGATIVE  NEGATIVE Final   Comment:        THE SENSITIVITY OF THIS METHODOLOGY IS >24 mIU/mL     Allergies: Abilify [aripiprazole], Amoxicillin, Asenapine, Codeine, Latex, Penicillins, Prednisone, Augmentin [amoxicillin-pot clavulanate], Lamictal [lamotrigine], and Tramadol  Medications:  Facility Ordered Medications  Medication   acetaminophen (TYLENOL) tablet 650 mg   alum & mag hydroxide-simeth (MAALOX/MYLANTA) 200-200-20 MG/5ML suspension 30 mL   magnesium hydroxide (MILK OF MAGNESIA) suspension 30 mL   hydrOXYzine (ATARAX) tablet 50 mg   QUEtiapine (SEROQUEL) tablet 50 mg   cloNIDine (CATAPRES) tablet 0.1 mg   traZODone (DESYREL) tablet 50 mg   nicotine (NICODERM CQ - dosed in mg/24 hours) patch 21 mg   PTA Medications  Medication Sig   clindamycin (CLEOCIN) 300 MG capsule Take 1 capsule (300 mg total) by mouth every 12 (twelve) hours. (Patient taking differently: Take 300 mg by mouth every 12 (twelve) hours. Take for 6 days starting on 09/27/22.)   ARIPiprazole (ABILIFY) 5 MG tablet Take 1 tablet (5 mg total) by mouth daily.   gabapentin (NEURONTIN) 300 MG capsule Take 1 capsule (300 mg total) by mouth 3 (three) times daily.   benzocaine (ORAJEL) 10 % mucosal gel Use as directed 1 Application in the mouth or throat as needed for mouth pain.    Medical Decision Making  Cocaine-induced Mood Disorder  Bipolar 1 Disorder  Pt requested D/C of Abilify due to side effects but is agreeable to starting Seroquel 50 mg BID for mania symptoms. Clonidine 0.1 mg  TID PRN  hours as needed for withdrawal symptoms. Gabapentin 200 mg TID for anxiety and withdrawal symptoms.   Wellbutrin 100 mg BID SR for depression and substance cravings.  Recommendations  Based on my evaluation the patient does not appear to have an emergency medical condition.  Molli Barrows, FNP-C, PMHNP-BC  [facility]  [phone]  10/10/22  2:33 PM

## 2022-10-11 MED ORDER — NAPROXEN 375 MG PO TABS
375.0000 mg | ORAL_TABLET | Freq: Two times a day (BID) | ORAL | Status: DC
  Administered 2022-10-11 – 2022-10-12 (×2): 375 mg via ORAL
  Filled 2022-10-11 (×2): qty 1

## 2022-10-11 MED ORDER — NICOTINE POLACRILEX 2 MG MT GUM
4.0000 mg | CHEWING_GUM | OROMUCOSAL | Status: DC
  Administered 2022-10-11 – 2022-10-12 (×5): 4 mg via ORAL
  Filled 2022-10-11 (×5): qty 2

## 2022-10-11 MED ORDER — BENZOCAINE 10 % MT GEL
Freq: Three times a day (TID) | OROMUCOSAL | Status: DC | PRN
  Filled 2022-10-11: qty 9

## 2022-10-11 MED ORDER — CLINDAMYCIN HCL 150 MG PO CAPS
300.0000 mg | ORAL_CAPSULE | Freq: Three times a day (TID) | ORAL | Status: DC
  Administered 2022-10-11 – 2022-10-12 (×2): 300 mg via ORAL
  Filled 2022-10-11 (×2): qty 2

## 2022-10-11 NOTE — ED Notes (Signed)
Pt calm and cooperative. Alert and orient x 4. No c/o pain or distress. Will continue to monitor for safety

## 2022-10-11 NOTE — ED Notes (Signed)
Pt sleeping@this time. Breathing even and unlabored. Will continue to monitor for safety 

## 2022-10-11 NOTE — ED Notes (Signed)
Patient requested  medication for aniexty atarax was  given.

## 2022-10-11 NOTE — ED Notes (Signed)
Patient alert and oriented x 3. Denies SI/HI/AVH. Denies intent or plan to harm self or others. Routine conducted according to faculty protocol. Encourage patient to notify staff with any needs or concerns. Patient verbalized agreement and understanding. Will continue to monitor for safety. 

## 2022-10-11 NOTE — ED Provider Notes (Addendum)
Behavioral Health Progress Note  Date and Time: 10/11/2022 10:03 AM Name: Cynthia Odonnell MRN:  [id number]  Subjective:  "I feel a little better today"  Diagnosis:  Final diagnoses:  Cocaine use with cocaine-induced mood disorder ([place])  Bipolar I disorder, most recent episode (or current) manic ([place])    Total Time spent with patient: 30 minutes  HPI: Cynthia Odonnell 37 y.o., female patient presented to [facility] as a walk in, voluntary unaccompanied with complaints of desiring to detox, would like to restart psychiatric medications, and assistance with getting into [facility].   Cynthia Odonnell, 37 y.o., female patient seen face to face by this provider, consulted with Dr. Dwyane Dee; and chart reviewed on 10/11/22.    Cynthia Odonnell was admitted to [facility] 2 weeks ago and requested to be discharged after 4 days of admission due to having RSV and felt uncomfortable and closed in as she was quarantined to her room due to having a respiratory illness and admits at that time she was craving to use cocaine. She reports recent stressors that have caused her to increase her usage of drugs such as CPS took custody of her children (49 and 75 years old), her mother died two days ago, but mother had her arrested for breaking and entering into her home as she put the patient out 3 weeks ago and since that time patient has been homeless. Cynthia Odonnell recently was jailed for several days and released two days ago related to breaking and entering charges. She endorses depression due to continued substance dependence. Last smoked crack 2 days ago. Last drank alcohol 3 days ago although denies dependence on alcohol.  Patient endorses routine use of  marijuana.  [name] would like to go to [facility]. She is aware if she doesn't get clean she will permanently lose custody of her children. Cynthia Odonnell CPS worker visited her at this facility yesterday. This Probation officer was not privy to the information discussed  during the encounter.  On evaluation today,  Cynthia Odonnell appears less manic compared to yesterday and is able to engage in more meaningful conversation. Patient reports that the Seroquel has helped  more compared to the Abilify.  She is still focused and motivated to obtain substance treatment. Denies any symptoms of withdrawal. UDS was only significant for cocaine and marijuana.    During evaluation Cynthia Odonnell is sitting upright on the edge of her bed, calm, and cooperative, in no acute distress.  She is alert, oriented x 4, cooperative and attentive.  Her mood is euthymic with congruent affect.  She has rapid speech with intermittent stuttering although non-pressured compared to evaluation on yesterday. Objectively mania appears to have resolved.  Patient is able to converse coherently and goal directed thoughts. She denies suicidal or homicidal ideations at present. Patient is admitted to continuous assessment unit for observation. SW assisting with efforts for placement to either [facility] and or [facility]. Sleep: Good  Appetite:  Good  Current Medications:  Current Facility-Administered Medications  Medication Dose Route Frequency Provider Last Rate Last Admin   acetaminophen (TYLENOL) tablet 650 mg  650 mg Oral Q6H PRN Scot Jun, NP       alum & mag hydroxide-simeth (MAALOX/MYLANTA) 200-200-20 MG/5ML suspension 30 mL  30 mL Oral Q4H PRN Scot Jun, NP       buPROPion ER [facility] SR) 12 hr tablet 100 mg  100 mg Oral BID Scot Jun, NP   100 mg at 10/11/22 [phone]  cloNIDine (CATAPRES) tablet 0.1 mg  0.1 mg Oral TID PRN Scot Jun, NP       gabapentin (NEURONTIN) capsule 200 mg  200 mg Oral TID Scot Jun, NP   200 mg at 10/11/22 [id number]   hydrOXYzine (ATARAX) tablet 50 mg  50 mg Oral TID PRN Scot Jun, NP   50 mg at 10/11/22 [id number]   magnesium hydroxide (MILK OF MAGNESIA) suspension 30 mL  30 mL Oral Daily PRN Scot Jun, NP       QUEtiapine (SEROQUEL) tablet 50 mg  50 mg Oral BID  Scot Jun, NP   50 mg at 10/11/22 [id number]   traZODone (DESYREL) tablet 50 mg  50 mg Oral QHS PRN Scot Jun, NP       Current Outpatient Medications  Medication Sig Dispense Refill   ARIPiprazole (ABILIFY) 5 MG tablet Take 1 tablet (5 mg total) by mouth daily. 30 tablet 0   benzocaine (ORAJEL) 10 % mucosal gel Use as directed 1 Application in the mouth or throat as needed for mouth pain. 5.3 g    clindamycin (CLEOCIN) 300 MG capsule Take 1 capsule (300 mg total) by mouth every 12 (twelve) hours. (Patient taking differently: Take 300 mg by mouth every 12 (twelve) hours. Take for 6 days starting on 09/27/22.) 12 capsule 0   gabapentin (NEURONTIN) 300 MG capsule Take 1 capsule (300 mg total) by mouth 3 (three) times daily. 90 capsule 0    Labs  Lab Results:  Admission on 10/10/2022  Component Date Value Ref Range Status   SARS Coronavirus 2 by RT PCR 10/10/2022 NEGATIVE  NEGATIVE Final   Influenza A by PCR 10/10/2022 NEGATIVE  NEGATIVE Final   Influenza B by PCR 10/10/2022 NEGATIVE  NEGATIVE Final   Comment: (NOTE) The Xpert Xpress SARS-CoV-2/FLU/RSV plus assay is intended as an aid in the diagnosis of influenza from Nasopharyngeal swab specimens and should not be used as a sole basis for treatment. Nasal washings and aspirates are unacceptable for Xpert Xpress SARS-CoV-2/FLU/RSV testing.  Fact Sheet for Patients: EntrepreneurPulse.com.au  Fact Sheet for Healthcare Providers: IncredibleEmployment.be  This test is not yet approved or cleared by the [place] FDA and has been authorized for detection and/or diagnosis of SARS-CoV-2 by FDA under an Emergency Use Authorization (EUA). This EUA will remain in effect (meaning this test can be used) for the duration of the COVID-19 declaration under Section 564(b)(1) of the Act, 21 U.S.C. section 360bbb-3(b)(1), unless the authorization is terminated or revoked.     Resp Syncytial Virus  by PCR 10/10/2022 NEGATIVE  NEGATIVE Final   Comment: (NOTE) Fact Sheet for Patients: EntrepreneurPulse.com.au  Fact Sheet for Healthcare Providers: IncredibleEmployment.be  This test is not yet approved or cleared by the [place] FDA and has been authorized for detection and/or diagnosis of SARS-CoV-2 by FDA under an Emergency Use Authorization (EUA). This EUA will remain in effect (meaning this test can be used) for the duration of the COVID-19 declaration under Section 564(b)(1) of the Act, 21 U.S.C. section 360bbb-3(b)(1), unless the authorization is terminated or revoked.  Performed at [facility], [place] [address] [place], [place] [address]    WBC 10/10/2022 5.8  4.0 - 10.5 K/uL Final   RBC 10/10/2022 4.77  3.87 - 5.11 MIL/uL Final   Hemoglobin 10/10/2022 14.1  12.0 - 15.0 g/dL Final   HCT 10/10/2022 39.7  36.0 - 46.0 % Final   MCV 10/10/2022 83.2  80.0 - 100.0  fL Final   MCH 10/10/2022 29.6  26.0 - 34.0 pg Final   MCHC 10/10/2022 35.5  30.0 - 36.0 g/dL Final   RDW 10/10/2022 13.1  11.5 - 15.5 % Final   Platelets 10/10/2022 280  150 - 400 K/uL Final   nRBC 10/10/2022 0.0  0.0 - 0.2 % Final   Neutrophils Relative % 10/10/2022 51  % Final   Neutro Abs 10/10/2022 2.9  1.7 - 7.7 K/uL Final   Lymphocytes Relative 10/10/2022 39  % Final   Lymphs Abs 10/10/2022 2.2  0.7 - 4.0 K/uL Final   Monocytes Relative 10/10/2022 8  % Final   Monocytes Absolute 10/10/2022 0.5  0.1 - 1.0 K/uL Final   Eosinophils Relative 10/10/2022 1  % Final   Eosinophils Absolute 10/10/2022 0.1  0.0 - 0.5 K/uL Final   Basophils Relative 10/10/2022 1  % Final   Basophils Absolute 10/10/2022 0.1  0.0 - 0.1 K/uL Final   Immature Granulocytes 10/10/2022 0  % Final   Abs Immature Granulocytes 10/10/2022 0.02  0.00 - 0.07 K/uL Final   Performed at [facility], [place] [address] [place], [place] [address]   Sodium 10/10/2022 140  135 - 145 mmol/L Final    Potassium 10/10/2022 3.5  3.5 - 5.1 mmol/L Final   Chloride 10/10/2022 105  98 - 111 mmol/L Final   CO2 10/10/2022 24  22 - 32 mmol/L Final   Glucose, Bld 10/10/2022 89  70 - 99 mg/dL Final   Glucose reference range applies only to samples taken after fasting for at least 8 hours.   BUN 10/10/2022 12  6 - 20 mg/dL Final   Creatinine, Ser 10/10/2022 0.86  0.44 - 1.00 mg/dL Final   Calcium 10/10/2022 8.9  8.9 - 10.3 mg/dL Final   Total Protein 10/10/2022 6.3 (L)  6.5 - 8.1 g/dL Final   Albumin 10/10/2022 3.5  3.5 - 5.0 g/dL Final   AST 10/10/2022 18  15 - 41 U/L Final   ALT 10/10/2022 19  0 - 44 U/L Final   Alkaline Phosphatase 10/10/2022 65  38 - 126 U/L Final   Total Bilirubin 10/10/2022 0.5  0.3 - 1.2 mg/dL Final   GFR, Estimated 10/10/2022 >60  >60 mL/min Final   Comment: (NOTE) Calculated using the CKD-EPI Creatinine Equation (2021)    Anion gap 10/10/2022 11  5 - 15 Final   Performed at [facility] [address], [place], [place] [address]   Preg Test, Ur 10/10/2022 Negative  Negative Final   POC Amphetamine UR 10/10/2022 None Detected  NONE DETECTED (Cut Off Level 1000 ng/mL) Final   POC Secobarbital (BAR) 10/10/2022 None Detected  NONE DETECTED (Cut Off Level 300 ng/mL) Final   POC Buprenorphine (BUP) 10/10/2022 None Detected  NONE DETECTED (Cut Off Level 10 ng/mL) Final   POC Oxazepam (BZO) 10/10/2022 None Detected  NONE DETECTED (Cut Off Level 300 ng/mL) Final   POC Cocaine UR 10/10/2022 Positive (A)  NONE DETECTED (Cut Off Level 300 ng/mL) Final   POC Methamphetamine UR 10/10/2022 None Detected  NONE DETECTED (Cut Off Level 1000 ng/mL) Final   POC Morphine 10/10/2022 None Detected  NONE DETECTED (Cut Off Level 300 ng/mL) Final   POC Methadone UR 10/10/2022 None Detected  NONE DETECTED (Cut Off Level 300 ng/mL) Final   POC Oxycodone UR 10/10/2022 None Detected  NONE DETECTED (Cut Off Level 100 ng/mL) Final   POC Marijuana UR 10/10/2022 Positive (A)  NONE DETECTED (Cut  Off Level 50 ng/mL) Final   SARSCOV2ONAVIRUS 2 AG 10/10/2022 NEGATIVE  NEGATIVE Final   Comment: (NOTE) SARS-CoV-2 antigen NOT DETECTED.   Negative results are presumptive.  Negative results do not preclude SARS-CoV-2 infection and should not be used as the sole basis for treatment or other patient management decisions, including infection  control decisions, particularly in the presence of clinical signs and  symptoms consistent with COVID-19, or in those who have been in contact with the virus.  Negative results must be combined with clinical observations, patient history, and epidemiological information. The expected result is Negative.  Fact Sheet for Patients: HandmadeRecipes.com.cy  Fact Sheet for Healthcare Providers: FuneralLife.at  This test is not yet approved or cleared by the [place] FDA and  has been authorized for detection and/or diagnosis of SARS-CoV-2 by FDA under an Emergency Use Authorization (EUA).  This EUA will remain in effect (meaning this test can be used) for the duration of  the COV                          ID-19 declaration under Section 564(b)(1) of the Act, 21 U.S.C. section 360bbb-3(b)(1), unless the authorization is terminated or revoked sooner.     Preg Test, Ur 10/10/2022 NEGATIVE  NEGATIVE Final   Comment:        THE SENSITIVITY OF THIS METHODOLOGY IS >24 mIU/mL   Admission on 09/24/2022, Discharged on 09/27/2022  Component Date Value Ref Range Status   SARS Coronavirus 2 by RT PCR 09/24/2022 NEGATIVE  NEGATIVE Final   Influenza A by PCR 09/24/2022 NEGATIVE  NEGATIVE Final   Influenza B by PCR 09/24/2022 NEGATIVE  NEGATIVE Final   Comment: (NOTE) The Xpert Xpress SARS-CoV-2/FLU/RSV plus assay is intended as an aid in the diagnosis of influenza from Nasopharyngeal swab specimens and should not be used as a sole basis for treatment. Nasal washings and aspirates are unacceptable for Xpert  Xpress SARS-CoV-2/FLU/RSV testing.  Fact Sheet for Patients: EntrepreneurPulse.com.au  Fact Sheet for Healthcare Providers: IncredibleEmployment.be  This test is not yet approved or cleared by the [place] FDA and has been authorized for detection and/or diagnosis of SARS-CoV-2 by FDA under an Emergency Use Authorization (EUA). This EUA will remain in effect (meaning this test can be used) for the duration of the COVID-19 declaration under Section 564(b)(1) of the Act, 21 U.S.C. section 360bbb-3(b)(1), unless the authorization is terminated or revoked.     Resp Syncytial Virus by PCR 09/24/2022 POSITIVE (A)  NEGATIVE Final   Comment: (NOTE) Fact Sheet for Patients: EntrepreneurPulse.com.au  Fact Sheet for Healthcare Providers: IncredibleEmployment.be  This test is not yet approved or cleared by the [place] FDA and has been authorized for detection and/or diagnosis of SARS-CoV-2 by FDA under an Emergency Use Authorization (EUA). This EUA will remain in effect (meaning this test can be used) for the duration of the COVID-19 declaration under Section 564(b)(1) of the Act, 21 U.S.C. section 360bbb-3(b)(1), unless the authorization is terminated or revoked.  Performed at [facility], [place] [address] [place], [place] [address]    WBC 09/24/2022 8.6  4.0 - 10.5 K/uL Final   RBC 09/24/2022 4.81  3.87 - 5.11 MIL/uL Final   Hemoglobin 09/24/2022 14.2  12.0 - 15.0 g/dL Final   HCT 09/24/2022 41.1  36.0 - 46.0 % Final   MCV 09/24/2022 85.4  80.0 - 100.0 fL Final   MCH 09/24/2022 29.5  26.0 - 34.0 pg Final  MCHC 09/24/2022 34.5  30.0 - 36.0 g/dL Final   RDW 09/24/2022 13.4  11.5 - 15.5 % Final   Platelets 09/24/2022 305  150 - 400 K/uL Final   nRBC 09/24/2022 0.0  0.0 - 0.2 % Final   Neutrophils Relative % 09/24/2022 72  % Final   Neutro Abs 09/24/2022 6.1  1.7 - 7.7 K/uL Final   Lymphocytes  Relative 09/24/2022 22  % Final   Lymphs Abs 09/24/2022 1.9  0.7 - 4.0 K/uL Final   Monocytes Relative 09/24/2022 5  % Final   Monocytes Absolute 09/24/2022 0.4  0.1 - 1.0 K/uL Final   Eosinophils Relative 09/24/2022 0  % Final   Eosinophils Absolute 09/24/2022 0.0  0.0 - 0.5 K/uL Final   Basophils Relative 09/24/2022 1  % Final   Basophils Absolute 09/24/2022 0.0  0.0 - 0.1 K/uL Final   Immature Granulocytes 09/24/2022 0  % Final   Abs Immature Granulocytes 09/24/2022 0.02  0.00 - 0.07 K/uL Final   Performed at [facility], [place] [address], [place], [place] [address]   Sodium 09/24/2022 137  135 - 145 mmol/L Final   Potassium 09/24/2022 2.9 (L)  3.5 - 5.1 mmol/L Final   Chloride 09/24/2022 102  98 - 111 mmol/L Final   CO2 09/24/2022 24  22 - 32 mmol/L Final   Glucose, Bld 09/24/2022 92  70 - 99 mg/dL Final   Glucose reference range applies only to samples taken after fasting for at least 8 hours.   BUN 09/24/2022 8  6 - 20 mg/dL Final   Creatinine, Ser 09/24/2022 0.71  0.44 - 1.00 mg/dL Final   Calcium 09/24/2022 9.0  8.9 - 10.3 mg/dL Final   Total Protein 09/24/2022 6.6  6.5 - 8.1 g/dL Final   Albumin 09/24/2022 3.6  3.5 - 5.0 g/dL Final   AST 09/24/2022 14 (L)  15 - 41 U/L Final   ALT 09/24/2022 10  0 - 44 U/L Final   Alkaline Phosphatase 09/24/2022 61  38 - 126 U/L Final   Total Bilirubin 09/24/2022 0.8  0.3 - 1.2 mg/dL Final   GFR, Estimated 09/24/2022 >60  >60 mL/min Final   Comment: (NOTE) Calculated using the CKD-EPI Creatinine Equation (2021)    Anion gap 09/24/2022 11  5 - 15 Final   Performed at [facility], [place] [address], [place], [place] [address]   Hgb A1c MFr Bld 09/24/2022 4.4 (L)  4.8 - 5.6 % Final   Comment: (NOTE) Pre diabetes:          5.7%-6.4%  Diabetes:              >6.4%  Glycemic control for   <7.0% adults with diabetes    Mean Plasma Glucose 09/24/2022 79.58  mg/dL Final   Performed at [facility], [place] [address],  [place], [place] [address]   Magnesium 09/24/2022 1.9  1.7 - 2.4 mg/dL Final   Performed at [facility] [address] [place], [place] [address]   Alcohol, Ethyl (B) 09/24/2022 <10  <10 mg/dL Final   Comment: (NOTE) Lowest detectable limit for serum alcohol is 10 mg/dL.  For medical purposes only. Performed at [facility], [place] [address] [place], [place] [address]    Cholesterol 09/24/2022 184  0 - 200 mg/dL Final   Triglycerides 09/24/2022 85  <150 mg/dL Final   HDL 09/24/2022 42  >40 mg/dL Final   Total CHOL/HDL Ratio 09/24/2022  4.4  RATIO Final   VLDL 09/24/2022 17  0 - 40 mg/dL Final   LDL Cholesterol 09/24/2022 125 (H)  0 - 99 mg/dL Final   Comment:        Total Cholesterol/HDL:CHD Risk Coronary Heart Disease Risk Table                     Men   Women  1/2 Average Risk   3.4   3.3  Average Risk       5.0   4.4  2 X Average Risk   9.6   7.1  3 X Average Risk  23.4   11.0        Use the calculated Patient Ratio above and the CHD Risk Table to determine the patient's CHD Risk.        ATP III CLASSIFICATION (LDL):  <100     mg/dL   Optimal  100-129  mg/dL   Near or Above                    Optimal  130-159  mg/dL   Borderline  160-189  mg/dL   High  >190     mg/dL   Very High Performed at [facility] [address] [place], [place] [address]    TSH 09/24/2022 0.662  0.350 - 4.500 uIU/mL Final   Comment: Performed by a 3rd Generation assay with a functional sensitivity of <=0.01 uIU/mL. Performed at [facility], [place] [address] [place], [place] [address]    Prolactin 09/24/2022 9.2  4.8 - 33.4 ng/mL Final   Comment: (NOTE) Performed At: [facility] [place], [place] [id number] Rush Farmer MD [id number]    POC Amphetamine UR 09/24/2022 Positive (A)  NONE DETECTED (Cut Off Level 1000 ng/mL) Final   POC Secobarbital (BAR) 09/24/2022 None Detected  NONE DETECTED (Cut Off Level 300 ng/mL) Final   POC Buprenorphine  (BUP) 09/24/2022 None Detected  NONE DETECTED (Cut Off Level 10 ng/mL) Final   POC Oxazepam (BZO) 09/24/2022 None Detected  NONE DETECTED (Cut Off Level 300 ng/mL) Final   POC Cocaine UR 09/24/2022 Positive (A)  NONE DETECTED (Cut Off Level 300 ng/mL) Final   POC Methamphetamine UR 09/24/2022 Positive (A)  NONE DETECTED (Cut Off Level 1000 ng/mL) Final   POC Morphine 09/24/2022 None Detected  NONE DETECTED (Cut Off Level 300 ng/mL) Final   POC Methadone UR 09/24/2022 None Detected  NONE DETECTED (Cut Off Level 300 ng/mL) Final   POC Oxycodone UR 09/24/2022 None Detected  NONE DETECTED (Cut Off Level 100 ng/mL) Final   POC Marijuana UR 09/24/2022 Positive (A)  NONE DETECTED (Cut Off Level 50 ng/mL) Final   Preg Test, Ur 09/24/2022 NEGATIVE  NEGATIVE Final   Comment:        THE SENSITIVITY OF THIS METHODOLOGY IS >24 mIU/mL     Blood Alcohol level:  Lab Results  Component Value Date   ETH <10 09/24/2022   ETH <10 [id number]    Metabolic Disorder Labs: Lab Results  Component Value Date   HGBA1C 4.4 (L) 09/24/2022   MPG 79.58 09/24/2022   MPG 91.06 06/01/2020   Lab Results  Component Value Date   PROLACTIN 9.2 09/24/2022   PROLACTIN 19.5 06/01/2020   Lab Results  Component Value Date   CHOL 184 09/24/2022   TRIG 85 09/24/2022   HDL 42 09/24/2022   CHOLHDL 4.4 09/24/2022   VLDL 17 09/24/2022  [place] 125 (H) 09/24/2022   LDLCALC 118 (H) 06/01/2020    Therapeutic Lab Levels: No results found for: "LITHIUM" No results found for: "VALPROATE" No results found for: "CBMZ"  Physical Findings   AIMS    Flowsheet Row Admission (Discharged) from OP Visit from 04/25/2019 in [place] Admission (Discharged) from OP Visit from 04/15/2019 in [facility] Admission (Discharged) from 03/05/2018 in [place] [facility]  AIMS Total Score 0 0 0      AUDIT    Flowsheet Row Admission (Discharged) from  OP Visit from 04/25/2019 in [place] Admission (Discharged) from OP Visit from 04/15/2019 in [facility] [facility]  Alcohol Use Disorder Identification Test Final Score (AUDIT) 0 23      GAD-7    Flowsheet Row Office Visit from 12/24/2020 in [place] 1  Total GAD-7 Score 17      PHQ2-9    Flowsheet Row ED from 09/24/2022 in [facility] Office Visit from 12/24/2020 in [place] [facility] US OB FOLLOW UP ADD'L GEST [facility] OB FOLLOW UP ADD'L GEST from 09/27/2014 [facility] OB FOLLOW UP ADD'L GEST from 09/06/2014 [facility]  PHQ-2 Total Score 0 4 0 0 0  PHQ-9 Total Score 0 15 -- -- --      Flowsheet Row ED from 10/10/2022 in [facility] ED from 09/24/2022 in [facility] OP Visit from 05/21/2021 in [place] No Risk Moderate Risk No Risk        Musculoskeletal  Strength & Muscle Tone: within normal limits Gait & Station: normal Patient leans: N/A  Psychiatric Specialty Exam  Presentation  General Appearance:  Disheveled  Eye Contact: Fair  Speech: Clear and Coherent  Speech Volume: Normal  Handedness: Right   Mood and Affect  Mood: Euphoric  Affect: Appropriate   Thought Process  Thought Processes: Coherent; Goal Directed  Descriptions of Associations:Intact  Orientation:Full (Time, Place and Person)  Thought Content:Logical  Diagnosis of Schizophrenia or Schizoaffective disorder in past: No    Hallucinations:Hallucinations: None  Ideas of Reference:None  Suicidal Thoughts:Suicidal Thoughts: No  Homicidal Thoughts:No data recorded  Sensorium  Memory: Immediate Good; Recent Good; Remote  Good  Judgment: Intact  Insight: Present   Executive Functions  Concentration: Fair  Attention Span: Fair  Recall: Good  Fund of Knowledge: Good  Language: Good   Psychomotor Activity  Psychomotor Activity: Psychomotor Activity: Normal   Assets  Assets: Communication Skills; Desire for Improvement; Resilience   Sleep  Sleep: Sleep: Good   Nutritional Assessment (For OBS and FBC admissions only) Has the patient had a weight loss or gain of 10 pounds or more in the last 3 months?: No Has the patient had a decrease in food intake/or appetite?: No Does the patient have dental problems?: No Does the patient have eating habits or behaviors that may be indicators of an eating disorder including binging or inducing vomiting?: No Has the patient recently lost weight without trying?: 0 Has the patient been eating poorly because of a decreased appetite?: 0 Malnutrition Screening Tool Score: 0    Physical Exam   Blood pressure 99/65, Odonnell 71, temperature 98.2 F (36.8 C), temperature source Oral, resp. rate 18, SpO2 100 %. There is no height  or weight on file to calculate BMI.  Treatment Plan Summary: Daily contact with patient to assess and evaluate symptoms and progress in treatment, Medication management, and Plan Substance Treatment facility, LCSW faxing out to [facility] and will reach out to [facility] if intake is available over the weekend.  Cocaine-induced Mood Disorder  Bipolar 1 Disorder  Pt requested D/C of Abilify due to side effects but is agreeable to starting Seroquel 50 mg BID for mania symptoms. Clonidine 0.1 mg  TID PRN  hours as needed for withdrawal symptoms. Gabapentin 200 mg TID for anxiety and withdrawal symptoms.  Wellbutrin 100 mg BID SR for depression and substance cravings.  Molli Barrows, NP-C 10/11/2022 10:03 AM

## 2022-10-11 NOTE — ED Notes (Signed)
Patient request Orajel for her tooth. Rn notified provider . Medication was ordered.

## 2022-10-11 NOTE — ED Notes (Signed)
Patient in  bed. Environment is secured. Will continue to monitor for safety.

## 2022-10-11 NOTE — ED Notes (Signed)
Pt given muffin and juice for breakfast

## 2022-10-11 NOTE — ED Notes (Signed)
Patient  sleeping in no acute stress. RR even and unlabored .Environment secured .Will continue to monitor for safely. 

## 2022-10-11 NOTE — Progress Notes (Addendum)
Provider Molli Barrows, NP has requested CSW to assist with substance treatment program referrals. CSW left a Voicemail with [facility] [place] [facility] [phone] and left a HIPAA complaint voicemail. CSW also sent referral to the following. CSW sent inquiring to [email or web address] at [place] with pt's referral. CSW is awaiting follow up.   Destination  Service Provider Address Phone Fax  [facility]  [address] [place] [address] [phone]  [facility]  [address] [place] [address] [phone]  [facility] [address], [place] [address] [place], MSW, [facility] 10/11/2022 5:41 PM

## 2022-10-12 MED ORDER — NICOTINE POLACRILEX 4 MG MT GUM: 4.0000 mg | CHEWING_GUM | OROMUCOSAL | 0 refills | Status: DC

## 2022-10-12 MED ORDER — QUETIAPINE FUMARATE 50 MG PO TABS: 50.0000 mg | ORAL_TABLET | Freq: Two times a day (BID) | ORAL | 0 refills | Status: DC

## 2022-10-12 MED ORDER — BUPROPION HCL ER (SR) 100 MG PO TB12: 100.0000 mg | ORAL_TABLET | Freq: Two times a day (BID) | ORAL | 0 refills | Status: DC

## 2022-10-12 MED ORDER — GABAPENTIN 300 MG PO CAPS: 300.0000 mg | ORAL_CAPSULE | Freq: Three times a day (TID) | ORAL | 0 refills | Status: DC

## 2022-10-12 NOTE — ED Notes (Signed)
Patient is requesting  to be discharged. Rn notified provider.

## 2022-10-12 NOTE — Progress Notes (Signed)
At 9:16am this CSW call [facility] and spoke with the owner Bruce Little who informs that pt has been in their program before and she would need more time to rest before being approved. Mr. Rex Kras shared that he would be willing to accept pt on Wednesday 10/15/22. Mr. Rex Kras requested updated notes be sent to [email or web address]. CSW updated provider Molli Barrows, FNP. CSW will assist with placement.   Benjaman Kindler, MSW, [facility] 10/12/2022 4:59 PM

## 2022-10-12 NOTE — Discharge Instructions (Addendum)
A 30 day supply of medications. Follow-up with [facility] recovery services tomorrow morning and request to speak with admissions or intake.

## 2022-10-12 NOTE — ED Notes (Signed)
Patient A&O x 4, ambulatory. Patient discharged in no acute distress. Patient denied SI/HI, A/VH upon discharge. Patient verbalized understanding of all discharge instructions explained by staff, to include follow up appointments, RX's and safety plan. Pt belongings returned to patient from locker intact. Patient escorted to lobby via staff for transport to destination. Safety maintained.   

## 2022-10-12 NOTE — ED Provider Notes (Signed)
FBC/OBS ASAP Discharge Summary  Date and Time: 10/12/2022 1:27 PM  Name: Cynthia Odonnell  MRN:  [id number]   Discharge Diagnoses:  Final diagnoses:  Cocaine use with cocaine-induced mood disorder ([place])  Bipolar I disorder, most recent episode (or current) manic [facility])    Subjective:   Stay Summary:  Cynthia Odonnell, 37 y.o., female patient seen face to face by this provider, consulted with Dr. Dwyane Dee; and chart reviewed on 10/12/22.     Cynthia Odonnell was admitted to [facility] 2 weeks ago and requested to be discharged after 4 days of admission due to having RSV and felt uncomfortable and closed in as she was quarantined to her room due to having a respiratory illness and admits at that time she was craving to use cocaine. She reports recent stressors that have caused her to increase her usage of drugs such as CPS took custody of her children (28 and 31 years old), her mother died two days ago, but mother had her arrested for breaking and entering into her home as she put the patient out 3 weeks ago and since that time patient has been homeless. Cynthia Odonnell recently was jailed for several days and released two days ago related to breaking and entering charges. She endorses depression due to continued substance dependence. Last smoked crack 3 days ago. Last drank alcohol 4 days ago although denies dependence on alcohol.  Patient endorses routine use of marijuana.  Cynthia Odonnell has been actively working independently to secure residential substance treatment and has an interview with [facility]. She is aware if she doesn't get clean she will permanently lose custody of her children.   Cynthia Odonnell CPS worker visited her at this facility two days ago, and this Probation officer was not privy to the information discussed during the encounter.    On evaluation today, Cynthia Odonnell is calm, cooperative, and no longer exhibiting any signs of withdrawal or mania. During this admission, was treated with Seroquel 50 mg BID, Wellbutrin 100 mg  BID, Gabapentin 300 mg TID, and Nicotine gum. Patient received a brief course of Clindamycin for tooth infection. Patient advised to follow-up outpatient for continue treatment.    During evaluation Cynthia Odonnell is sitting upright on the edge of her bed, calm, and cooperative, in no acute distress.  She is alert, oriented x 4, cooperative and attentive.  Her mood is euthymic with congruent affect.  She has rapid speech with intermittent stuttering although non-pressured compared to evaluation on yesterday. Objectively mania appears to have resolved.  Patient is able to converse coherently and goal directed thoughts. She denies suicidal or homicidal ideations at present. Patient is admitted to continuous assessment unit for observation. SW assisting with efforts for placement to either [facility] and or [facility].    After learning of her interview with [facility] patient requested discharge. Patient denies SI/HI/AH/VH. Patient is able to contract for safety and is being discharged to follow-up with resources for residential Substance Abuse treatment facilities.  Total Time spent with patient: 30 minutes Tobacco Cessation:  A prescription for an FDA-approved tobacco cessation medication provided at discharge  Current Medications:  No current facility-administered medications for this encounter.   Current Outpatient Medications  Medication Sig Dispense Refill   benzocaine (ORAJEL) 10 % mucosal gel Use as directed 1 Application in the mouth or throat as needed for mouth pain. 5.3 g    clindamycin (CLEOCIN) 300 MG capsule Take 1 capsule (300 mg total) by mouth every 12 (  twelve) hours. (Patient taking differently: Take 300 mg by mouth every 12 (twelve) hours. Take for 6 days starting on 09/27/22.) 12 capsule 0   buPROPion ER (WELLBUTRIN SR) 100 MG 12 hr tablet Take 1 tablet (100 mg total) by mouth 2 (two) times daily. 30 tablet 0   gabapentin (NEURONTIN) 300 MG capsule Take 1 capsule (300 mg  total) by mouth 3 (three) times daily. 90 capsule 0   nicotine polacrilex (NICORETTE) 4 MG gum Take 1 each (4 mg total) by mouth every 4 (four) hours while awake. 100 tablet 0   QUEtiapine (SEROQUEL) 50 MG tablet Take 1 tablet (50 mg total) by mouth 2 (two) times daily. 30 tablet 0    PTA Medications:  PTA Medications  Medication Sig   clindamycin (CLEOCIN) 300 MG capsule Take 1 capsule (300 mg total) by mouth every 12 (twelve) hours. (Patient taking differently: Take 300 mg by mouth every 12 (twelve) hours. Take for 6 days starting on 09/27/22.)   benzocaine (ORAJEL) 10 % mucosal gel Use as directed 1 Application in the mouth or throat as needed for mouth pain.   buPROPion ER (WELLBUTRIN SR) 100 MG 12 hr tablet Take 1 tablet (100 mg total) by mouth 2 (two) times daily.   nicotine polacrilex (NICORETTE) 4 MG gum Take 1 each (4 mg total) by mouth every 4 (four) hours while awake.   QUEtiapine (SEROQUEL) 50 MG tablet Take 1 tablet (50 mg total) by mouth 2 (two) times daily.   gabapentin (NEURONTIN) 300 MG capsule Take 1 capsule (300 mg total) by mouth 3 (three) times daily.       09/27/2022   11:27 AM 09/24/2022    6:16 PM 12/24/2020   10:27 AM  Depression screen PHQ 2/9  Decreased Interest 0 2 2  Down, Depressed, Hopeless 0 2 2  PHQ - 2 Score 0 4 4  Altered sleeping 0 1 3  Tired, decreased energy 0 1 1  Change in appetite 0 1 3  Feeling bad or failure about yourself  0 2 2  Trouble concentrating 0 2 2  Moving slowly or fidgety/restless 0 3 0  Suicidal thoughts 0 1 0  PHQ-9 Score 0 15 15  Difficult doing work/chores  Somewhat difficult     Flowsheet Row ED from [facility]10/2022 in [facility] ED [facility]2022 [facility] OP Visit from 05/21/2021 in [place] No Risk Moderate Risk No Risk       Musculoskeletal  Strength & Muscle Tone: within normal limits Gait & Station:  normal Patient leans: N/A  Psychiatric Specialty Exam  Presentation  General Appearance:  Appropriate for Environment  Eye Contact: Good  Speech: Clear and Coherent  Speech Volume: Normal  Handedness: Right   Mood and Affect  Mood: Euthymic  Affect: Appropriate   Thought Process  Thought Processes: Coherent; Goal Directed  Descriptions of Associations:Intact  Orientation:Full (Time, Place and Person)  Thought Content:Logical  Diagnosis of Schizophrenia or Schizoaffective disorder in past: No    Hallucinations:Hallucinations: None  Ideas of Reference:None  Suicidal Thoughts:Suicidal Thoughts: No  Homicidal Thoughts:Homicidal Thoughts: No   Sensorium  Memory: Immediate Good; Recent Good  Judgment: Fair  Insight: Fair   Community education officer  Concentration: Fair  Attention Span: Fair  Recall: Good  Fund of Knowledge: Good  Language: Good   Psychomotor Activity  Psychomotor Activity: Psychomotor Activity: Normal (Patient speaking more clearly today.  Stuttering  or word finding)   Assets  Assets: Communication Skills; Desire for Improvement; Resilience   Sleep  Sleep: Sleep: Good Number of Hours of Sleep: 8     Physical Exam   Physical Exam Constitutional:      Comments: Disheveled  appearance   HENT:     Head: Normocephalic.  Eyes:     Extraocular Movements: Extraocular movements intact.     Pupils: Pupils are equal, round, and reactive to light.  Cardiovascular:     Rate and Rhythm: Normal rate.  Pulmonary:     Effort: Pulmonary effort is normal.     Breath sounds: Normal breath sounds.  Musculoskeletal:     Cervical back: Normal range of motion.  Neurological:     General: No focal deficit present.     Mental Status: She is alert.     Motor: Tremor present.     Comments: Generalized body tremors noticed on exam       Review of Systems  Psychiatric/Behavioral:  Positive for depression and substance  abuse. Negative for hallucinations, memory loss and suicidal ideas. The patient is nervous/anxious. The patient does not have insomnia  Blood pressure 99/62, pulse 76, temperature 97.7 F (36.5 C), temperature source Oral, resp. rate 18, SpO2 99 %. There is no height or weight on file to calculate BMI.  Demographic Factors:  Caucasian, Low socioeconomic status, and Living alone  Loss Factors: Loss of significant relationship  Historical Factors: Prior suicide attempts  Risk Reduction Factors:   Responsible for children under 31 years of age, Sense of responsibility to family, Positive social support, Positive therapeutic relationship, and Positive coping skills or problem solving skills  Continued Clinical Symptoms:  Bipolar Disorder:   Mixed State Alcohol/Substance Abuse/Dependencies  Cognitive Features That Contribute To Risk:  None    Suicide Risk:  Minimal: No identifiable suicidal ideation.  Patients presenting with no risk factors but with morbid ruminations; may be classified as minimal risk based on the severity of the depressive symptoms  Plan Of Care/Follow-up recommendations:  Activity:  As tolerated. Continue medications as prescribed.  Follow-up with residential substance treatment programs.  Return as needed or any acute mental health crisis for evaluation.  Disposition: Home to [place], FNP-C, PMHNP-BC  [facility] Urgent [phone]  10/12/2022, 1:27 PM

## 2022-10-12 NOTE — ED Notes (Signed)
Patient is outside with staff.

## 2022-10-12 NOTE — ED Notes (Signed)
Patient is eating  and watching tv. Environment is safe. Will continue to monitor for safety.

## 2022-10-12 NOTE — ED Notes (Signed)
Provider discharge patient home'@137'$ 

## 2022-10-12 NOTE — ED Notes (Signed)
Patient alert and oriented x 3. Denies SI/HI/AVH. Denies intent or plan to harm self or others. Routine conducted according to faculty protocol. Encourage patient to notify staff with any needs or concerns. Patient verbalized agreement and understanding. Will continue to monitor for safety. 

## 2022-10-12 NOTE — ED Notes (Signed)
Patient eating breakfast.  Environment is secured. Will continue to monitor for safety.

## 2022-12-31 ENCOUNTER — Other Ambulatory Visit: Payer: Self-pay

## 2022-12-31 ENCOUNTER — Emergency Department (HOSPITAL_COMMUNITY)
Admission: EM | Admit: 2022-12-31 | Discharge: 2022-12-31 | Disposition: A | Discharge status: Home / Self Care | Payer: Medicaid Other | Attending: Emergency Medicine | Admitting: Emergency Medicine

## 2022-12-31 DIAGNOSIS — R21 Rash and other nonspecific skin eruption: Secondary | ICD-10-CM | POA: Diagnosis not present

## 2022-12-31 DIAGNOSIS — Z91040 Latex allergy status: Secondary | ICD-10-CM | POA: Diagnosis not present

## 2022-12-31 NOTE — ED Notes (Addendum)
Pt stated that EDP said she was discharged and since he wasn't giving her any medicine she was leaving. Pt left without discharge papers.

## 2022-12-31 NOTE — ED Triage Notes (Signed)
C/o rash to torso x2 weeks.  Reports sleeping in new house.  Denies itching.

## 2023-01-02 NOTE — ED Provider Notes (Signed)
Cynthia Odonnell EMERGENCY DEPARTMENT AT [facility] Provider Note   CSN: [id number] Arrival date & time: 12/31/22  1406     History  Chief Complaint  Patient presents with   Rash    Cynthia Odonnell is a 37 y.o. female.   Rash Patient is a 37 year old female with history of depression smoke anxiety headaches cocaine abuse in remission and reflux  She presents emergency room today with complaints of rash to her chest abdomen and upper legs for 2 weeks that has been improving.  She states that it was itching but is not itching now.  She states that it is faded tremendously.  She denies any nausea vomiting lightheadedness dizziness facial swelling neck swelling difficulty breathing weakness cough congestion or pain.     Home Medications Prior to Admission medications   Medication Sig Start Date End Date Taking? Authorizing Provider  benzocaine (ORAJEL) 10 % mucosal gel Use as directed 1 Application in the mouth or throat as needed for mouth pain. 09/27/22   Lauro Franklin, MD  buPROPion ER [facility] SR) 100 MG 12 hr tablet Take 1 tablet (100 mg total) by mouth 2 (two) times daily. 10/12/22   Bing Neighbors, NP  clindamycin (CLEOCIN) 300 MG capsule Take 1 capsule (300 mg total) by mouth every 12 (twelve) hours. Patient taking differently: Take 300 mg by mouth every 12 (twelve) hours. Take for 6 days starting on 09/27/22. 09/27/22   Lauro Franklin, MD  gabapentin (NEURONTIN) 300 MG capsule Take 1 capsule (300 mg total) by mouth 3 (three) times daily. 10/12/22   Bing Neighbors, NP  nicotine polacrilex (NICORETTE) 4 MG gum Take 1 each (4 mg total) by mouth every 4 (four) hours while awake. 10/12/22   Bing Neighbors, NP  QUEtiapine (SEROQUEL) 50 MG tablet Take 1 tablet (50 mg total) by mouth 2 (two) times daily. 10/12/22   Bing Neighbors, NP      Allergies    Abilify [aripiprazole], Amoxicillin, Asenapine, Codeine, Latex, Penicillins, Prednisone, Augmentin  [amoxicillin-pot clavulanate], Lamictal [lamotrigine], and Tramadol    Review of Systems   Review of Systems  Skin:  Positive for rash.    Physical Exam Updated Vital Signs BP 103/83 (BP Location: Left Arm)   Pulse 85   Temp 97.8 F (36.6 C) (Oral)   Resp 18   Ht 5\' 6"  (1.676 m)   Wt 83 kg   SpO2 100%   BMI 29.53 kg/m  Physical Exam Vitals and nursing note reviewed.  Constitutional:      General: She is not in acute distress. HENT:     Head: Normocephalic and atraumatic.     Nose: Nose normal.  Eyes:     General: No scleral icterus. Cardiovascular:     Rate and Rhythm: Normal rate and regular rhythm.     Pulses: Normal pulses.     Heart sounds: Normal heart sounds.  Pulmonary:     Effort: Pulmonary effort is normal. No respiratory distress.     Breath sounds: No wheezing.  Abdominal:     Palpations: Abdomen is soft.     Tenderness: There is no abdominal tenderness.  Musculoskeletal:     Cervical back: Normal range of motion.     Right lower leg: No edema.     Left lower leg: No edema.  Skin:    General: Skin is warm and dry.     Capillary Refill: Capillary refill takes less than 2 seconds.  Comments: Faint nondescript rash to abdomen no other areas such as palm or soles of feet seem to be affected.  Neurological:     Mental Status: She is alert. Mental status is at baseline.  Psychiatric:        Mood and Affect: Mood normal.        Behavior: Behavior normal.     ED Results / Procedures / Treatments   Labs (all labs ordered are listed, but only abnormal results are displayed) Labs Reviewed - No data to display  EKG None  Radiology No results found.  Procedures Procedures    Medications Ordered in ED Medications - No data to display  ED Course/ Medical Decision Making/ A&P                             Medical Decision Making  Patient is a 37 year old female with history of depression smoke anxiety headaches cocaine abuse in remission and  reflux  She presents emergency room today with complaints of rash to her chest abdomen and upper legs for 2 weeks that has been improving.  She states that it was itching but is not itching now.  She states that it is faded tremendously.  She denies any nausea vomiting lightheadedness dizziness facial swelling neck swelling difficulty breathing weakness cough congestion or pain.  Faint rash to chest and abdomen.  It seems to be blanching and is not showing any signs of infection such as redness, cellulitic changes, there is no abnormal warmth or tenderness to touch.  It does not follow a dermatomal distribution is seems to be across her abdomen.  Given her symptoms are improving I think very reasonable to hold off on any aggressive treatment and monitor and follow-up with her primary care doctor.  Return precautions discussed  Final Clinical Impression(s) / ED Diagnoses Final diagnoses:  Rash    Rx / DC Orders ED Discharge Orders     None         Gailen Shelter, [place] 01/02/23 1759    Rexford Maus, DO 01/06/23 [phone]

## 2023-01-18 ENCOUNTER — Other Ambulatory Visit: Payer: Self-pay

## 2023-01-18 ENCOUNTER — Encounter (HOSPITAL_BASED_OUTPATIENT_CLINIC_OR_DEPARTMENT_OTHER): Payer: Self-pay

## 2023-01-18 ENCOUNTER — Emergency Department (HOSPITAL_BASED_OUTPATIENT_CLINIC_OR_DEPARTMENT_OTHER)
Admission: EM | Admit: 2023-01-18 | Discharge: 2023-01-18 | Disposition: A | Discharge status: Home / Self Care | Payer: Medicaid Other | Attending: Emergency Medicine | Admitting: Emergency Medicine

## 2023-01-18 ENCOUNTER — Emergency Department (HOSPITAL_BASED_OUTPATIENT_CLINIC_OR_DEPARTMENT_OTHER): Payer: Medicaid Other

## 2023-01-18 DIAGNOSIS — F419 Anxiety disorder, unspecified: Secondary | ICD-10-CM | POA: Insufficient documentation

## 2023-01-18 DIAGNOSIS — R791 Abnormal coagulation profile: Secondary | ICD-10-CM | POA: Insufficient documentation

## 2023-01-18 DIAGNOSIS — Z1152 Encounter for screening for COVID-19: Secondary | ICD-10-CM | POA: Insufficient documentation

## 2023-01-18 DIAGNOSIS — R531 Weakness: Secondary | ICD-10-CM | POA: Diagnosis present

## 2023-01-18 DIAGNOSIS — Z91040 Latex allergy status: Secondary | ICD-10-CM | POA: Diagnosis not present

## 2023-01-18 DIAGNOSIS — R0789 Other chest pain: Secondary | ICD-10-CM | POA: Insufficient documentation

## 2023-01-18 LAB — LIPASE, BLOOD: Lipase: 54 U/L — ABNORMAL HIGH (ref 11–51)

## 2023-01-18 LAB — COMPREHENSIVE METABOLIC PANEL
ALT: 15 U/L (ref 0–44)
AST: 21 U/L (ref 15–41)
Albumin: 3.1 g/dL — ABNORMAL LOW (ref 3.5–5.0)
Alkaline Phosphatase: 77 U/L (ref 38–126)
Anion gap: 8 (ref 5–15)
BUN: 9 mg/dL (ref 6–20)
CO2: 20 mmol/L — ABNORMAL LOW (ref 22–32)
Calcium: 8 mg/dL — ABNORMAL LOW (ref 8.9–10.3)
Chloride: 108 mmol/L (ref 98–111)
Creatinine, Ser: 0.7 mg/dL (ref 0.44–1.00)
GFR, Estimated: >60 mL/min (ref >60)
Glucose, Bld: 106 mg/dL — ABNORMAL HIGH (ref 70–99)
Potassium: 3.8 mmol/L (ref 3.5–5.1)
Sodium: 136 mmol/L (ref 135–145)
Total Bilirubin: 0.7 mg/dL (ref 0.3–1.2)
Total Protein: 6.7 g/dL (ref 6.5–8.1)

## 2023-01-18 LAB — CBC WITH DIFFERENTIAL/PLATELET
Abs Immature Granulocytes: 0.03 10*3/uL (ref 0.00–0.07)
Basophils Absolute: 0.1 10*3/uL (ref 0.0–0.1)
Basophils Relative: 1 %
Eosinophils Absolute: 0.1 10*3/uL (ref 0.0–0.5)
Eosinophils Relative: 1 %
HCT: 38.6 % (ref 36.0–46.0)
Hemoglobin: 12.9 g/dL (ref 12.0–15.0)
Immature Granulocytes: 0 %
Lymphocytes Relative: 18 %
Lymphs Abs: 1.5 10*3/uL (ref 0.7–4.0)
MCH: 27 pg (ref 26.0–34.0)
MCHC: 33.4 g/dL (ref 30.0–36.0)
MCV: 80.8 fL (ref 80.0–100.0)
Monocytes Absolute: 0.6 10*3/uL (ref 0.1–1.0)
Monocytes Relative: 7 %
Neutro Abs: 5.8 10*3/uL (ref 1.7–7.7)
Neutrophils Relative %: 73 %
Platelets: 316 10*3/uL (ref 150–400)
RBC: 4.78 MIL/uL (ref 3.87–5.11)
RDW: 15.2 % (ref 11.5–15.5)
WBC: 7.9 10*3/uL (ref 4.0–10.5)
nRBC: 0 % (ref 0.0–0.2)

## 2023-01-18 LAB — TROPONIN I (HIGH SENSITIVITY): Troponin I (High Sensitivity): <2 ng/L (ref <18)

## 2023-01-18 LAB — RESP PANEL BY RT-PCR (RSV, FLU A&B, COVID)  RVPGX2
Influenza A by PCR: NEGATIVE
Influenza B by PCR: NEGATIVE
Resp Syncytial Virus by PCR: NEGATIVE
SARS Coronavirus 2 by RT PCR: NEGATIVE

## 2023-01-18 LAB — HCG, QUANTITATIVE, PREGNANCY: hCG, Beta Chain, Quant, S: <1 m[IU]/mL (ref <5)

## 2023-01-18 LAB — D-DIMER, QUANTITATIVE: D-Dimer, Quant: 1.16 ug/mL-FEU — ABNORMAL HIGH (ref 0.00–0.50)

## 2023-01-18 MED ORDER — METHYLPREDNISOLONE 4 MG PO TBPK: ORAL_TABLET | ORAL | 0 refills | Status: DC

## 2023-01-18 MED ORDER — IOHEXOL 350 MG/ML SOLN
75.0000 mL | Freq: Once | INTRAVENOUS | Status: AC | PRN
  Administered 2023-01-18: 75 mL via INTRAVENOUS

## 2023-01-18 MED ORDER — SODIUM CHLORIDE 0.9 % IV BOLUS
1000.0000 mL | Freq: Once | INTRAVENOUS | Status: AC
  Administered 2023-01-18: 1000 mL via INTRAVENOUS

## 2023-01-18 MED ORDER — LORAZEPAM 2 MG/ML IJ SOLN
1.0000 mg | Freq: Once | INTRAMUSCULAR | Status: AC
  Administered 2023-01-18: 1 mg via INTRAVENOUS
  Filled 2023-01-18: qty 1

## 2023-01-18 NOTE — ED Triage Notes (Signed)
Pt reports she got bit by tick in the vagina 1 months ago. PT states she is now losing her voice, "I am so sick", and that she has generalized rash. Pt states she has neck stiffness.

## 2023-01-18 NOTE — ED Provider Notes (Signed)
[facility] Provider Note   CSN: [id number] Arrival date & time: 01/18/23  1949     History  Chief Complaint  Patient presents with   Weakness    Cynthia Odonnell is a 37 y.o. female.  Patient here with generalized weakness for the last few weeks.  Pain all over.  Polysubstance abuse history.  Mostly crack cocaine.  Has not used in over a week.  Cynthia Odonnell feels weak and has a lot of pain in her joints and body.  Possibly had a small tick on her recently.  Feels like Cynthia Odonnell is got a rash.  Cynthia Odonnell feels like Cynthia Odonnell has a hoarse voice, chest discomfort, shortness of breath, headaches, dizziness.  Cynthia Odonnell has been under a lot of stress.  Mom recently died.  Denies any IV drug use.  The history is provided by the patient.       Home Medications Prior to Admission medications   Medication Sig Start Date End Date Taking? Authorizing Provider  methylPREDNISolone (MEDROL DOSEPAK) 4 MG TBPK tablet Follow package insert 01/18/23  Yes Hershell Brandl, DO  benzocaine (ORAJEL) 10 % mucosal gel Use as directed 1 Application in the mouth or throat as needed for mouth pain. 09/27/22   Lauro Franklin, MD  buPROPion ER [facility] SR) 100 MG 12 hr tablet Take 1 tablet (100 mg total) by mouth 2 (two) times daily. 10/12/22   Bing Neighbors, NP  clindamycin (CLEOCIN) 300 MG capsule Take 1 capsule (300 mg total) by mouth every 12 (twelve) hours. Patient taking differently: Take 300 mg by mouth every 12 (twelve) hours. Take for 6 days starting on 09/27/22. 09/27/22   Lauro Franklin, MD  gabapentin (NEURONTIN) 300 MG capsule Take 1 capsule (300 mg total) by mouth 3 (three) times daily. 10/12/22   Bing Neighbors, NP  nicotine polacrilex (NICORETTE) 4 MG gum Take 1 each (4 mg total) by mouth every 4 (four) hours while awake. 10/12/22   Bing Neighbors, NP  QUEtiapine (SEROQUEL) 50 MG tablet Take 1 tablet (50 mg total) by mouth 2 (two) times daily. 10/12/22   Bing Neighbors, NP      Allergies    Abilify [aripiprazole], Amoxicillin, Asenapine, Codeine, Latex, Penicillins, Prednisone, Augmentin [amoxicillin-pot clavulanate], Lamictal [lamotrigine], and Tramadol    Review of Systems   Review of Systems  Physical Exam Updated Vital Signs BP (!) 125/92 (BP Location: Left Arm)   Pulse 83   Temp (!) 97.4 F (36.3 C) (Oral)   Resp 20   Ht 5\' 6"  (1.676 m)   Wt 83 kg   LMP  (LMP Unknown)   SpO2 93%   BMI 29.53 kg/m  Physical Exam Vitals and nursing note reviewed.  Constitutional:      General: Cynthia Odonnell is not in acute distress.    Appearance: Cynthia Odonnell is well-developed. Cynthia Odonnell is not ill-appearing.  HENT:     Head: Normocephalic and atraumatic.     Nose: Nose normal.     Mouth/Throat:     Mouth: Mucous membranes are dry.  Eyes:     Extraocular Movements: Extraocular movements intact.     Conjunctiva/sclera: Conjunctivae normal.     Pupils: Pupils are equal, round, and reactive to light.  Cardiovascular:     Rate and Rhythm: Normal rate and regular rhythm.     Pulses: Normal pulses.     Heart sounds: Normal heart sounds. No murmur heard. Pulmonary:     Effort:  Pulmonary effort is normal. No respiratory distress.     Breath sounds: Normal breath sounds.  Abdominal:     Palpations: Abdomen is soft.     Tenderness: There is no abdominal tenderness.  Musculoskeletal:        General: No swelling.     Cervical back: Normal range of motion and neck supple.  Skin:    General: Skin is warm and dry.     Capillary Refill: Capillary refill takes less than 2 seconds.     Comments: I do not identify any obvious rash.  No petechiae  Neurological:     General: No focal deficit present.     Mental Status: Cynthia Odonnell is alert and oriented to person, place, and time.     Cranial Nerves: No cranial nerve deficit.     Sensory: No sensory deficit.     Motor: No weakness.     Coordination: Coordination normal.     Gait: Gait normal.  Psychiatric:     Comments:  Patient appears anxious and tearful     ED Results / Procedures / Treatments   Labs (all labs ordered are listed, but only abnormal results are displayed) Labs Reviewed  COMPREHENSIVE METABOLIC PANEL - Abnormal; Notable for the following components:      Result Value   CO2 20 (*)    Glucose, Bld 106 (*)    Calcium 8.0 (*)    Albumin 3.1 (*)    All other components within normal limits  LIPASE, BLOOD - Abnormal; Notable for the following components:   Lipase 54 (*)    All other components within normal limits  D-DIMER, QUANTITATIVE - Abnormal; Notable for the following components:   D-Dimer, Quant 1.16 (*)    All other components within normal limits  RESP PANEL BY RT-PCR (RSV, FLU A&B, COVID)  [id number]  CBC WITH DIFFERENTIAL/PLATELET  HCG, QUANTITATIVE, PREGNANCY  TROPONIN I (HIGH SENSITIVITY)    EKG EKG Interpretation  Date/Time:  Sunday January 18 2023 21:05:28 EDT Ventricular Rate:  72 PR Interval:  98 QRS Duration: 80 QT Interval:  404 QTC Calculation: 442 R Axis:   71 Text Interpretation: Sinus rhythm with short PR Otherwise normal ECG When compared with ECG of 10-Oct-2022 13:06, PREVIOUS ECG IS PRESENT Confirmed by Virgina Norfolk ([id number]) on 01/18/2023 9:09:57 PM  Radiology CT Angio Chest PE W and/or Wo Contrast  Result Date: 01/18/2023 CLINICAL DATA:  Pulmonary embolism (PE) suspected, low to intermediate prob, positive D-dimer EXAM: CT ANGIOGRAPHY CHEST WITH CONTRAST TECHNIQUE: Multidetector CT imaging of the chest was performed using the standard protocol during bolus administration of intravenous contrast. Multiplanar CT image reconstructions and MIPs were obtained to evaluate the vascular anatomy. RADIATION DOSE REDUCTION: This exam was performed according to the departmental dose-optimization program which includes automated exposure control, adjustment of the mA and/or kV according to patient size and/or use of iterative reconstruction technique. CONTRAST:  75mL  OMNIPAQUE IOHEXOL 350 MG/ML SOLN COMPARISON:  06/29/2015 FINDINGS: Cardiovascular: SVC patent. Heart size upper limits normal. No pericardial effusion. The RV is nondilated. Satisfactory opacification of pulmonary arteries noted, and there is no evidence of pulmonary emboli. Adequate contrast opacification of the thoracic aorta with no evidence of dissection, aneurysm, or stenosis. There is bovine variant brachiocephalic arch anatomy without proximal stenosis. No significant atheromatous plaque. Mediastinum/Nodes: No mediastinal hematoma, mass, or adenopathy. Prominent subcentimeter bilateral axillary lymph nodes. Lungs/Pleura: No pleural effusion. No pneumothorax. Lungs are clear. Upper Abdomen: No acute findings. Musculoskeletal: Vertebral endplate spurring at multiple  levels in the lower thoracic spine. No fracture or worrisome bone lesion. Review of the MIP images confirms the above findings. IMPRESSION: 1. Negative.  No acute PE or thoracic aortic dissection. Electronically Signed   By: Corlis Leak M.D.   On: 01/18/2023 22:04   DG Chest Portable 1 View  Result Date: 01/18/2023 CLINICAL DATA:  Pain EXAM: PORTABLE CHEST 1 VIEW COMPARISON:  Chest radiograph dated 07/29/2018. FINDINGS: The heart size and mediastinal contours are within normal limits. Both lungs are clear. The visualized skeletal structures are unremarkable. IMPRESSION: No active disease. Electronically Signed   By: Romona Curls M.D.   On: 01/18/2023 20:32   CT Head Wo Contrast  Result Date: 01/18/2023 CLINICAL DATA:  Altered mental status. EXAM: CT HEAD WITHOUT CONTRAST TECHNIQUE: Contiguous axial images were obtained from the base of the skull through the vertex without intravenous contrast. RADIATION DOSE REDUCTION: This exam was performed according to the departmental dose-optimization program which includes automated exposure control, adjustment of the mA and/or kV according to patient size and/or use of iterative reconstruction  technique. COMPARISON:  None Available. FINDINGS: Brain: The ventricles and sulci are appropriate size for the patient's age. The gray-white matter discrimination is preserved. There is no acute intracranial hemorrhage. No mass effect or midline shift. No extra-axial fluid collection. Vascular: No hyperdense vessel or unexpected calcification. Skull: Normal. Negative for fracture or focal lesion. Sinuses/Orbits: Mild mucoperiosteal thickening of paranasal sinuses. There is partial opacification of the mastoid air cells. Other: None IMPRESSION: 1. No acute intracranial pathology. 2. Partial opacification of the mastoid air cells. Electronically Signed   By: Elgie Collard M.D.   On: 01/18/2023 20:32    Procedures Procedures    Medications Ordered in ED Medications  sodium chloride 0.9 % bolus 1,000 mL (1,000 mLs Intravenous New Bag/Given 01/18/23 2052)  LORazepam (ATIVAN) injection 1 mg (1 mg Intravenous Given 01/18/23 2053)  iohexol (OMNIPAQUE) 350 MG/ML injection 75 mL (75 mLs Intravenous Contrast Given 01/18/23 2152)    ED Course/ Medical Decision Making/ A&P                             Medical Decision Making Amount and/or Complexity of Data Reviewed Labs: ordered. Radiology: ordered.  Risk Prescription drug management.   AIRIANNA EID is here with generalized weakness.  History of depression, bronchitis, anxiety, cocaine abuse.  Normal vitals.  No fever.  Patient appears anxious and tearful on exam.  Cynthia Odonnell describes pain all over the last few weeks.  Cynthia Odonnell endorses rash but I do not see any obvious rash on exam.  May be recent tick exposure.  I do not see any type of rash that would make me worried about [facility] spotted fever or Lyme disease or other tickborne illness.  Cynthia Odonnell states Cynthia Odonnell last used cocaine maybe a week and a half ago.  Recently lost her mom.  Cynthia Odonnell overall has multiple complaints and will cast a broad workup.  Vital signs are reassuring.  Differential diagnosis could be  infectious processes versus less likely cardiac/pulmonary/neurologic process.  Will get head CT, chest x-ray, basic labs, troponin, D-dimer, EKG, COVID and flu testing.  Will give IV fluids, IV Ativan.  Is possible that this could be stress related as well.  Will reevaluate.  At this time I have no concern for stroke or other neuromuscular process.  Per my review and interpretation of labs is no significant anemia or leukocytosis or electrolyte abnormality  or kidney injury.  Troponin normal.  Doubt ACS.  D-dimer elevated will get a CT scan to further rule out PE.  Chest x-ray per my review interpretation shows no evidence of pneumonia.  Head CT is unremarkable as well.  Cynthia Odonnell is feeling better after IV fluids and Ativan.  Will get CT scan.  If this is unremarkable anticipate short course of steroids to help with possible inflammatory process.  CT scans unremarkable.  Discharged in good condition.  Understands return precautions.  This chart was dictated using voice recognition software.  Despite best efforts to proofread,  errors can occur which can change the documentation meaning.         Final Clinical Impression(s) / ED Diagnoses Final diagnoses:  Weakness    Rx / DC Orders ED Discharge Orders          Ordered    methylPREDNISolone (MEDROL DOSEPAK) 4 MG TBPK tablet        01/18/23 2222              Virgina Norfolk, DO 01/18/23 2223

## 2023-01-18 NOTE — Discharge Instructions (Addendum)
Overall workup today is unremarkable.  Strongly recommend following up with the wellness center.  I have prescribed you anti-inflammatory medicine called Medrol Dosepak to help with what I suspect might be an inflammatory process.

## 2023-01-29 ENCOUNTER — Encounter (HOSPITAL_BASED_OUTPATIENT_CLINIC_OR_DEPARTMENT_OTHER): Payer: Self-pay | Admitting: Emergency Medicine

## 2023-01-29 ENCOUNTER — Other Ambulatory Visit: Payer: Self-pay

## 2023-01-29 ENCOUNTER — Emergency Department (HOSPITAL_BASED_OUTPATIENT_CLINIC_OR_DEPARTMENT_OTHER)
Admission: EM | Admit: 2023-01-29 | Discharge: 2023-01-29 | Disposition: A | Discharge status: Home / Self Care | Payer: Medicaid Other | Attending: Emergency Medicine | Admitting: Emergency Medicine

## 2023-01-29 DIAGNOSIS — J029 Acute pharyngitis, unspecified: Secondary | ICD-10-CM | POA: Diagnosis present

## 2023-01-29 DIAGNOSIS — Z91040 Latex allergy status: Secondary | ICD-10-CM | POA: Insufficient documentation

## 2023-01-29 LAB — GROUP A STREP BY PCR: Group A Strep by PCR: NOT DETECTED

## 2023-01-29 MED ORDER — LIDOCAINE HCL (PF) 1 % IJ SOLN
1.0000 mL | Freq: Once | INTRAMUSCULAR | Status: AC
  Administered 2023-01-29: 2 mL
  Filled 2023-01-29: qty 5

## 2023-01-29 MED ORDER — CEFTRIAXONE SODIUM 500 MG IJ SOLR
500.0000 mg | Freq: Once | INTRAMUSCULAR | Status: AC
  Administered 2023-01-29: 500 mg via INTRAMUSCULAR
  Filled 2023-01-29: qty 500

## 2023-01-29 NOTE — Discharge Instructions (Signed)
I suspect your sore throat may be due to gonorrhea infection, we have sent to test for this and you will be called in 2 to 3 days if results are positive.  We have already treated you today for potential gonorrhea infection.

## 2023-01-29 NOTE — ED Provider Notes (Signed)
[place] [facility] Provider Note   CSN: [id number] Arrival date & time: 01/29/23  1158     History  Chief Complaint  Patient presents with   Sore Throat    Cynthia Odonnell is a 37 y.o. female.  Cynthia Odonnell is a 37 y.o. female history of GERD, anxiety, depression, cocaine abuse, who presents to the emergency department for evaluation of persistent sore throat.  Patient reports that she is a been experiencing a sore throat and pain with swallowing for 1 month.  She reports she was seen in the ED for this along with other symptoms on 6/16, had negative viral testing and an otherwise reassuring workup.  Reports that her sore throat has persisted and she noticed white spots on her tonsils.  No fevers or chills.  No cough, no rhinorrhea, no abdominal pain, nausea or vomiting.  Patient does report that she has been sexually active with a new partner recently and has participated in oral sex, she denies any vaginal discharge or vaginal bleeding.  No other aggravating or alleviating factors  The history is provided by the patient.  Sore Throat       Home Medications Prior to Admission medications   Medication Sig Start Date End Date Taking? Authorizing Provider  benzocaine (ORAJEL) 10 % mucosal gel Use as directed 1 Application in the mouth or throat as needed for mouth pain. 09/27/22   Lauro Franklin, MD  buPROPion ER [facility] SR) 100 MG 12 hr tablet Take 1 tablet (100 mg total) by mouth 2 (two) times daily. 10/12/22   Bing Neighbors, NP  clindamycin (CLEOCIN) 300 MG capsule Take 1 capsule (300 mg total) by mouth every 12 (twelve) hours. Patient taking differently: Take 300 mg by mouth every 12 (twelve) hours. Take for 6 days starting on 09/27/22. 09/27/22   Lauro Franklin, MD  gabapentin (NEURONTIN) 300 MG capsule Take 1 capsule (300 mg total) by mouth 3 (three) times daily. 10/12/22   Bing Neighbors, NP  methylPREDNISolone  (MEDROL DOSEPAK) 4 MG TBPK tablet Follow package insert 01/18/23   Curatolo, Adam, DO  nicotine polacrilex (NICORETTE) 4 MG gum Take 1 each (4 mg total) by mouth every 4 (four) hours while awake. 10/12/22   Bing Neighbors, NP  QUEtiapine (SEROQUEL) 50 MG tablet Take 1 tablet (50 mg total) by mouth 2 (two) times daily. 10/12/22   Bing Neighbors, NP      Allergies    Abilify [aripiprazole], Amoxicillin, Asenapine, Codeine, Latex, Penicillins, Prednisone, Augmentin [amoxicillin-pot clavulanate], Lamictal [lamotrigine], and Tramadol    Review of Systems   Review of Systems  Constitutional:  Negative for chills and fever.  HENT:  Positive for sore throat.   All other systems reviewed and are negative.   Physical Exam Updated Vital Signs BP 113/81 (BP Location: Left Arm)   Pulse 82   Temp 98.1 F (36.7 C) (Oral)   Resp 18   Ht 5\' 6"  (1.676 m)   Wt 78.7 kg   LMP 01/19/2023   SpO2 100%   BMI 28.02 kg/m  Physical Exam Vitals and nursing note reviewed.  Constitutional:      General: She is not in acute distress.    Appearance: Normal appearance. She is well-developed. She is not ill-appearing or diaphoretic.  HENT:     Head: Normocephalic and atraumatic.     Mouth/Throat:     Comments: Posterior oropharynx with mild bilateral tonsillar edema with large  white patches on the tonsils, uvula is midline without swelling, normal phonation, tolerating secretions without difficulty, no evidence of peritonsillar abscess Eyes:     General:        Right eye: No discharge.        Left eye: No discharge.  Cardiovascular:     Rate and Rhythm: Normal rate and regular rhythm.  Pulmonary:     Effort: Pulmonary effort is normal. No respiratory distress.  Musculoskeletal:     Cervical back: Neck supple.  Lymphadenopathy:     Cervical: No cervical adenopathy.  Skin:    General: Skin is warm and dry.     Findings: No rash.  Neurological:     Mental Status: She is alert and oriented to  person, place, and time.     Coordination: Coordination normal.  Psychiatric:        Mood and Affect: Mood normal.        Behavior: Behavior normal.     ED Results / Procedures / Treatments   Labs (all labs ordered are listed, but only abnormal results are displayed) Labs Reviewed  GROUP A STREP BY PCR    EKG None  Radiology No results found.  Procedures Procedures    Medications Ordered in ED Medications - No data to display  ED Course/ Medical Decision Making/ A&P                             Medical Decision Making Risk Prescription drug management.   37 year old female presents with 1 month of persistent sore throat, group A strep PCR is negative, patient has large tonsillar exudates without lymphadenopathy.  Given new recent sexual partner concern for gonorrhea pharyngitis, will send swab and go ahead and treat with Rocephin.  Patient is aware she has STI testing pending and will be called by phone if any results are positive.  Encouraged her to continue using ibuprofen, Tylenol for symptomatic care, strict return precautions and PCP follow-up discussed.  Discharged home in good condition.        Final Clinical Impression(s) / ED Diagnoses Final diagnoses:  Pharyngitis, unspecified etiology    Rx / DC Orders ED Discharge Orders     None         Dartha Lodge, [place] 02/04/23 1416    Terald Sleeper, MD 02/04/23 [phone]

## 2023-01-29 NOTE — ED Triage Notes (Signed)
Sore throat for about one month.  No known fever.  Painful to swallow.  Tonsils enlarged, noted white patches.

## 2023-01-30 LAB — GC/CHLAMYDIA PROBE AMP (~~LOC~~) NOT AT ARMC
Chlamydia: NEGATIVE
Comment: NEGATIVE
Comment: NORMAL
Neisseria Gonorrhea: NEGATIVE

## 2023-02-09 ENCOUNTER — Telehealth: Payer: Self-pay | Admitting: General Practice

## 2023-02-09 ENCOUNTER — Ambulatory Visit: Payer: Medicaid Other | Admitting: Internal Medicine

## 2023-02-09 NOTE — Telephone Encounter (Signed)
Pt was a no show for a NP appt with Morrie Sheldon on 02/09/23, I did not send a letter.

## 2023-02-16 NOTE — Telephone Encounter (Signed)
Pt called in 7/15 stating her mother died and she missed appt. Allowed 1x reschedule.

## 2023-02-17 NOTE — Telephone Encounter (Signed)
 Provider aware

## 2023-03-06 ENCOUNTER — Encounter: Payer: Self-pay | Admitting: Internal Medicine

## 2023-03-06 ENCOUNTER — Ambulatory Visit (INDEPENDENT_AMBULATORY_CARE_PROVIDER_SITE_OTHER): Payer: MEDICAID | Admitting: Internal Medicine

## 2023-03-06 VITALS — BP 108/72 | HR 63 | Temp 98.5°F | Ht 66.0 in | Wt 175.2 lb

## 2023-03-06 DIAGNOSIS — F3162 Bipolar disorder, current episode mixed, moderate: Secondary | ICD-10-CM

## 2023-03-06 DIAGNOSIS — Z975 Presence of (intrauterine) contraceptive device: Secondary | ICD-10-CM

## 2023-03-06 DIAGNOSIS — F250 Schizoaffective disorder, bipolar type: Secondary | ICD-10-CM | POA: Diagnosis not present

## 2023-03-06 DIAGNOSIS — Z560 Unemployment, unspecified: Secondary | ICD-10-CM

## 2023-03-06 DIAGNOSIS — F1990 Other psychoactive substance use, unspecified, uncomplicated: Secondary | ICD-10-CM | POA: Diagnosis not present

## 2023-03-06 NOTE — Progress Notes (Signed)
[facility] [address] [place] [address] Dept: [phone] Dept Fax: [phone]  New Patient Office Visit  Subjective:   Cynthia Odonnell February 21, 1986 03/06/2023  Chief Complaint  Patient presents with   Establish Care   Mental Health Problem    Referral to OB/GYN to remove birth control    HPI: Cynthia Odonnell presents today to establish care at [facility] [employer]. Introduced to Publishing rights manager role and practice setting.  All questions answered.  Concerns: See below   Discussed the use of AI scribe software for clinical note transcription with the patient, who gave verbal consent to proceed.  History of Present Illness   The patient, with a history of bipolar disorder, schizoaffective disorder, and substance abuse, is establishing care. She has been through significant life stressors recently, including the death of their mother and grandfather, homelessness, and legal issues. The patient has a history of cocaine and crack use, which she reports has decreased significantly.  She is currently not on any medication for psychiatric disorders.  She has a Nexplanon implant for birth control that was inserted in 2016 and needs to be removed. The patient reports feeling lonely and sleeping a lot. She is currently self-medicating with marijuana and an unverified supplement from [place] called Szil[place].She has limited contact with their children, who live in another city. The patient is currently unemployed but she expresses a desire to return to work and to have a purpose. She is currently living in a house they inherited from their mother.          03/06/2023    8:44 AM 09/27/2022   11:27 AM 09/24/2022    6:16 PM  Depression screen PHQ 2/9  Decreased Interest 1 0 2  Down, Depressed, Hopeless 1 0 2  PHQ - 2 Score 2 0 4  Altered sleeping 1 0 1  Tired, decreased energy 1 0 1  Change in appetite 1 0 1   Feeling bad or failure about yourself  1 0 2  Trouble concentrating 1 0 2  Moving slowly or fidgety/restless 0 0 3  Suicidal thoughts 0 0 1  PHQ-9 Score 7 0 15  Difficult doing work/chores   Somewhat difficult      03/06/2023    8:44 AM 12/24/2020   10:28 AM  GAD 7 : Generalized Anxiety Score  Nervous, Anxious, on Edge 1 3  Control/stop worrying 1 3  Worry too much - different things 1 3  Trouble relaxing 1 2  Restless 0 2  Easily annoyed or irritable 1 2  Afraid - awful might happen 1 2  Total GAD 7 Score 6 17  Anxiety Difficulty Somewhat difficult      The following portions of the patient's history were reviewed and updated as appropriate: past medical history, past surgical history, family history, social history, allergies, medications, and problem list.   Patient Active Problem List   Diagnosis Date Noted   Cocaine use disorder (HCC) 09/24/2022   Schizoaffective disorder, bipolar type (HCC) 12/25/2020   Psychophysiological insomnia 12/25/2020   Anxiety state 12/25/2020   Cocaine use disorder, severe, dependence (HCC) 04/05/2020   Bipolar 1 disorder, mixed, moderate (HCC) 04/25/2019   Substance use disorder 04/16/2019   Substance induced mood disorder (HCC) 03/06/2018   Bipolar 1 disorder (HCC)    Bradycardia    Cocaine abuse with intoxication and without complication (HCC)    Hypotension    Smoking  Past Medical History:  Diagnosis Date   Abnormal Pap smear of cervix    Anxiety    Arthritis    Bronchitis    Hx - uses albuterol inhaler prn   Cocaine abuse, in remission (HCC)    Depression    GERD (gastroesophageal reflux disease)    zantac prn   H/O cesarean section 11/04/2014   Headache(784.0)    otc prn   Optic neuritis    left eye   Smoker    Suicide attempt [facility])    Termination of pregnancy (fetus)    x 2   Uterine scar from previous cesarean delivery, antepartum complication    Past Surgical History:  Procedure Laterality Date   CESAREAN  SECTION  07/31/2008   [facility]   CESAREAN SECTION N/A 11/04/2014   Procedure: CESAREAN SECTION;  Surgeon: Tracey Harries, MD;  Location: [facility];  Service: Obstetrics;  Laterality: N/A;   COLPOSCOPY     WISDOM TOOTH EXTRACTION     Family History  Problem Relation Age of Onset   Other Neg Hx    Outpatient Medications Prior to Visit  Medication Sig Dispense Refill   benzocaine (ORAJEL) 10 % mucosal gel Use as directed 1 Application in the mouth or throat as needed for mouth pain. 5.3 g    buPROPion ER (WELLBUTRIN SR) 100 MG 12 hr tablet Take 1 tablet (100 mg total) by mouth 2 (two) times daily. 30 tablet 0   clindamycin (CLEOCIN) 300 MG capsule Take 1 capsule (300 mg total) by mouth every 12 (twelve) hours. (Patient taking differently: Take 300 mg by mouth every 12 (twelve) hours. Take for 6 days starting on 09/27/22.) 12 capsule 0   gabapentin (NEURONTIN) 300 MG capsule Take 1 capsule (300 mg total) by mouth 3 (three) times daily. 90 capsule 0   methylPREDNISolone (MEDROL DOSEPAK) 4 MG TBPK tablet Follow package insert 21 each 0   nicotine polacrilex (NICORETTE) 4 MG gum Take 1 each (4 mg total) by mouth every 4 (four) hours while awake. 100 tablet 0   QUEtiapine (SEROQUEL) 50 MG tablet Take 1 tablet (50 mg total) by mouth 2 (two) times daily. 30 tablet 0   No facility-administered medications prior to visit.   Allergies  Allergen Reactions   Abilify [Aripiprazole] Other (See Comments)    Tardive dyskinesia   Amoxicillin Hives   Asenapine Itching and Other (See Comments)    Face paralysis   Codeine Itching   Latex Itching   Penicillins Hives and Other (See Comments)    Has patient had a PCN reaction causing immediate rash, facial/tongue/throat swelling, SOB or lightheadedness with hypotension: No Has patient had a PCN reaction causing severe rash involving mucus membranes or skin necrosis: No Has patient had a PCN reaction that required hospitalization: No Has patient had a PCN reaction  occurring within the last 10 years: No If all of the above answers are "NO", then may proceed with Cephalosporin use.   Prednisone Itching   Augmentin [Amoxicillin-Pot Clavulanate] Hives, Rash and Other (See Comments)    Dizziness   Lamictal [Lamotrigine] Rash   Tramadol Itching and Rash    ROS: A complete ROS was performed with pertinent positives/negatives noted in the HPI. The remainder of the ROS are negative.   Objective:   Today's Vitals   03/06/23 0841  BP: 108/72  Pulse: 63  Temp: 98.5 F (36.9 C)  TempSrc: Temporal  SpO2: 98%  Weight: 175 lb 3.2 oz (79.5 kg)  Height: 5'  6" (1.676 m)    GENERAL: Well-appearing, in NAD. Well nourished.  SKIN: Pink, warm and dry.  NECK: Trachea midline. Full ROM w/o pain or tenderness. No lymphadenopathy.  RESPIRATORY: Chest wall symmetrical. Respirations even and non-labored. Breath sounds clear to auscultation bilaterally.  CARDIAC: S1, S2 present, regular rate and rhythm. Peripheral pulses 2+ bilaterally.  EXTREMITIES: Without clubbing, cyanosis, or edema.  NEUROLOGIC: No motor or sensory deficits. Steady, even gait.  PSYCH/MENTAL STATUS: Alert, oriented x 3. Cooperative, intermittently tearful.   Health Maintenance Due  Topic Date Due   DTaP/Tdap/Td (1 - Tdap) Never done   PAP SMEAR-Modifier  Never done   INFLUENZA VACCINE  03/05/2023    No results found for any visits on 03/06/23.  Assessment & Plan:  Assessment and Plan    Expired Nexplanon Patient has an expired Nexplanon implant from 2016 that needs removal. -Referral to OBGYN for Nexplanon removal.  Bipolar and Schizoaffective Disorder Patient reports a history of bipolar and schizoaffective disorder. She has experienced significant life stressors including the loss of family members, homelessness, and incarceration. She has a history of cocaine and crack use, which she reports has decreased but not ceased. She is currently self-medicating with marijuana and an  unverified supplement from [place] called Szil[place]. -Referral to Psychiatry for evaluation and management of bipolar and schizoaffective disorder. -Referral to Social Work for assistance with community resources, job placement, and potential counseling services.  Substance Use Disorder Patient has a history of cocaine and crack use, which she reports has decreased but not ceased. She is currently self-medicating with marijuana. -Consider referral to addiction services for evaluation and management of substance use disorder.  Unemployment and Financial Instability Patient is currently unemployed and has expressed a desire to return to work.  -Referral to Social Work for assistance with job placement and potential financial resources.  Orders Placed This Encounter  Procedures   Ambulatory referral to Obstetrics / Gynecology    Referral Priority:   Routine    Referral Type:   Consultation    Referral Reason:   Specialty Services Required    Requested Specialty:   Obstetrics and Gynecology    Number of Visits Requested:   1   Ambulatory referral to Social Work    Referral Priority:   Routine    Referral Type:   Consultation    Referral Reason:   Specialty Services Required    Number of Visits Requested:   1   Ambulatory referral to Psychiatry    Referral Priority:   Routine    Referral Type:   Psychiatric    Referral Reason:   Specialty Services Required    Requested Specialty:   Psychiatry    Number of Visits Requested:   1      Return in about 3 months (around 06/06/2023) for Fasting Annual Physical Exam.   Of note, portions of this note may have been created with voice recognition software Physicist, medical). While this note has been edited for accuracy, occasional wrong-word or 'sound-a-like' substitutions may have occurred due to the inherent limitations of voice recognition software.  Salvatore Decent, FNP

## 2023-03-27 ENCOUNTER — Telehealth: Payer: Self-pay

## 2023-03-27 NOTE — Telephone Encounter (Signed)
Called patient to schedule new patient appointment. Left voicemail with our contact information to call back and schedule.  

## 2023-04-03 ENCOUNTER — Ambulatory Visit: Payer: MEDICAID | Admitting: Nurse Practitioner

## 2023-06-08 NOTE — Progress Notes (Deleted)
Subjective:   Cynthia Odonnell March 05, 1986  06/09/2023   CC: No chief complaint on file.   HPI: Cynthia Odonnell is a 37 y.o. female who presents for a routine health maintenance exam.  Labs collected at time of visit.    HEALTH SCREENINGS: - Pap smear: {Blank single:19197::"pap done","not applicable","up to date","done elsewhere"} - Mammogram (40+): Not applicable  - Colonoscopy (45+): Not applicable  - Bone Density (65+): Not applicable  - Lung CA screening with low-dose CT:  Not applicable Adults age 28-80 who are current cigarette smokers or quit within the last 15 years. Must have 20 pack year history.   Depression and Anxiety Screen done today and results listed below:     03/06/2023    8:44 AM 09/27/2022   11:27 AM 09/24/2022    6:16 PM 12/24/2020   10:27 AM 10/25/2014   10:56 AM  Depression screen PHQ 2/9  Decreased Interest 1   2 0  Down, Depressed, Hopeless 1   2 0  PHQ - 2 Score 2   4 0  Altered sleeping 1   3   Tired, decreased energy 1   1   Change in appetite 1   3   Feeling bad or failure about yourself  1   2   Trouble concentrating 1   2   Moving slowly or fidgety/restless 0   0   Suicidal thoughts 0   0   PHQ-9 Score 7   15   Difficult doing work/chores          Information is confidential and restricted. Go to Review Flowsheets to unlock data.      03/06/2023    8:44 AM 12/24/2020   10:28 AM  GAD 7 : Generalized Anxiety Score  Nervous, Anxious, on Edge 1 3  Control/stop worrying 1 3  Worry too much - different things 1 3  Trouble relaxing 1 2  Restless 0 2  Easily annoyed or irritable 1 2  Afraid - awful might happen 1 2  Total GAD 7 Score 6 17  Anxiety Difficulty Somewhat difficult     IMMUNIZATIONS: - Tdap: Tetanus vaccination status reviewed: last tetanus booster within 10 years. - HPV: Not applicable - Influenza: {Blank single:19197::"Up to date","Administered today","Postponed to flu season","Refused","Given elsewhere"} - Zostavax (50+):  Not applicable   Past medical history, surgical history, medications, allergies, family history and social history reviewed with patient today and changes made to appropriate areas of the chart.   Past Medical History:  Diagnosis Date   Abnormal Pap smear of cervix    Anxiety    Arthritis    Bronchitis    Hx - uses albuterol inhaler prn   Cocaine abuse, in remission (HCC)    Depression    GERD (gastroesophageal reflux disease)    zantac prn   H/O cesarean section 11/04/2014   Headache(784.0)    otc prn   Optic neuritis    left eye   Smoker    Suicide attempt [facility]    Termination of pregnancy (fetus)    x 2   Uterine scar from previous cesarean delivery, antepartum complication     Past Surgical History:  Procedure Laterality Date   [facility] SE[facility]   CESAREAN SECTION N/A 11/04/2014   Procedure: CESAREAN SECTION;  Surgeon: Tracey Harries, MD;  Location: [facility];  Service: Obstetrics;  Laterality: N/A;   COLPOSCOPY     WISDOM TOOTH EXTRACTION  No current outpatient medications on file prior to visit.   No current facility-administered medications on file prior to visit.    Allergies  Allergen Reactions   Abilify [Aripiprazole] Other (See Comments)    Tardive dyskinesia   Amoxicillin Hives   Asenapine Itching and Other (See Comments)    Face paralysis   Codeine Itching   Latex Itching   Penicillins Hives and Other (See Comments)    Has patient had a PCN reaction causing immediate rash, facial/tongue/throat swelling, SOB or lightheadedness with hypotension: No Has patient had a PCN reaction causing severe rash involving mucus membranes or skin necrosis: No Has patient had a PCN reaction that required hospitalization: No Has patient had a PCN reaction occurring within the last 10 years: No If all of the above answers are "NO", then may proceed with Cephalosporin use.   Prednisone Itching   Augmentin [Amoxicillin-Pot Clavulanate] Hives, Rash and  Other (See Comments)    Dizziness   Lamictal [Lamotrigine] Rash   Tramadol Itching and Rash     Social History   Socioeconomic History   Marital status: Married    Spouse name: Not on file   Number of children: Not on file   Years of education: Not on file   Highest education level: Not on file  Occupational History   Not on file  Tobacco Use   Smoking status: Every Day    Current packs/day: 1.00    Average packs/day: 1 pack/day for 16.0 years (16.0 ttl pk-yrs)    Types: Cigarettes   Smokeless tobacco: Never  Vaping Use   Vaping status: Never Used  Substance and Sexual Activity   Alcohol use: Yes    Comment: Beer: Tonight    Drug use: Yes    Frequency: 3.0 times per week    Types: Cocaine, Methamphetamines    Comment: Last used: yesterday    Sexual activity: Yes    Birth control/protection: Implant    Comment: implant "old"  Other Topics Concern   Not on file  Social History Narrative   Not on file   Social Determinants of Health   Financial Resource Strain: Not on file  Food Insecurity: Not on file  Transportation Needs: Not on file  Physical Activity: Not on file  Stress: Not on file  Social Connections: Not on file  Intimate Partner Violence: Not on file   Social History   Tobacco Use  Smoking Status Every Day   Current packs/day: 1.00   Average packs/day: 1 pack/day for 16.0 years (16.0 ttl pk-yrs)   Types: Cigarettes  Smokeless Tobacco Never   Social History   Substance and Sexual Activity  Alcohol Use Yes   Comment: Beer: Tonight     Family History  Problem Relation Age of Onset   Other Neg Hx      ROS: Denies fever, fatigue, unexplained weight loss/gain, hearing or vision changes, cardiac or respiratory complaints. Denies neurological deficits, musculoskeletal complaints, gastrointestinal or genitourinary complaints, mental health complaints, and skin changes.   Objective:   There were no vitals filed for this visit.  GENERAL  APPEARANCE: Well-appearing, in NAD. Well nourished.  SKIN: Pink, warm and dry. Turgor normal. No rash, lesion, ulceration, or ecchymoses. Hair evenly distributed.  HEENT: HEAD: Normocephalic.  EYES: PERRLA. EOMI. Lids intact w/o defect. Sclera white, Conjunctiva pink w/o exudate.  EARS: External ear w/o redness, swelling, masses or lesions. EAC clear. TM's intact, translucent w/o bulging, appropriate landmarks visualized. Appropriate acuity to conversational tones.  NOSE:  Septum midline w/o deformity. Nares patent, mucosa pink and non-inflamed w/o drainage. No sinus tenderness.  THROAT: Uvula midline. Oropharynx clear. Tonsils non-inflamed w/o exudate ***. Oral mucosa pink and moist.  NECK: Supple, Trachea midline. Full ROM w/o pain or tenderness. No lymphadenopathy. Thyroid non-tender w/o enlargement or palpable masses.  BREASTS: Breasts pendulous, symmetrical, and w/o palpable masses. Nipples everted and w/o discharge. No rash or skin retraction. No axillary or supraclavicular lymphadenopathy.  RESPIRATORY: Chest wall symmetrical w/o masses. Respirations even and non-labored. Breath sounds clear to auscultation bilaterally. No wheezes, rales, rhonchi, or crackles. CARDIAC: S1, S2 present, regular rate and rhythm. No gallops, murmurs, rubs, or clicks. No carotid bruits. Capillary refill <2 seconds. Peripheral pulses 2+ bilaterally. GI: Abdomen soft w/o distention. Normoactive bowel sounds. No palpable masses or tenderness. No guarding or rebound tenderness. Liver and spleen w/o tenderness or enlargement. No CVA tenderness.  GU: *** External genitalia without erythema, lesions, or masses. No lymphadenopathy. Vaginal mucosa pink and moist without exudate, lesions, or ulcerations. Cervix pink without discharge. Cervical os closed. Uterus and adnexae palpable, not enlarged, and w/o tenderness. No palpable masses.  MSK: Muscle tone and strength appropriate for age, w/o atrophy or abnormal movement.   EXTREMITIES: Active ROM intact, w/o tenderness, crepitus, or contracture. No obvious joint deformities or effusions. No clubbing, edema, or cyanosis.  NEUROLOGIC: CN's II-XII intact. Motor strength symmetrical with no obvious weakness. No sensory deficits. DTR's 2+ symmetric bilaterally. Steady, even gait.  PSYCH/MENTAL STATUS: Alert, oriented x 3. Cooperative, appropriate mood and affect.   Chaperoned by Mary Sella CMA ***  Assessment & Plan:  There are no diagnoses linked to this encounter.  No orders of the defined types were placed in this encounter.   PATIENT COUNSELING:  - Encouraged a healthy well-balanced diet. Patient may adjust caloric intake to maintain or achieve ideal body weight. May reduce intake of dietary saturated fat and total fat and have adequate dietary potassium and calcium preferably from fresh fruits, vegetables, and low-fat dairy products.   - Advised to avoid cigarette smoking. - Discussed with the patient that most people either abstain from alcohol or drink within safe limits (<=14/week and <=4 drinks/occasion for males, <=7/weeks and <= 3 drinks/occasion for females) and that the risk for alcohol disorders and other health effects rises proportionally with the number of drinks per week and how often a drinker exceeds daily limits. - Discussed cessation/primary prevention of drug use and availability of treatment for abuse.  - Discussed sexually transmitted diseases, avoidance of unintended pregnancy and contraceptive alternatives.  - Stressed the importance of regular exercise - Injury prevention: Discussed safety belts, safety helmets, smoke detector, smoking near bedding or upholstery.  - Dental health: Discussed importance of regular tooth brushing, flossing, and dental visits.   NEXT PREVENTATIVE PHYSICAL DUE IN 1 YEAR.  No follow-ups on file.  Salvatore Decent, FNP

## 2023-06-09 ENCOUNTER — Encounter: Payer: MEDICAID | Admitting: Internal Medicine

## 2023-06-09 ENCOUNTER — Telehealth: Payer: Self-pay | Admitting: Internal Medicine

## 2023-06-09 DIAGNOSIS — Z23 Encounter for immunization: Secondary | ICD-10-CM

## 2023-06-09 DIAGNOSIS — Z124 Encounter for screening for malignant neoplasm of cervix: Secondary | ICD-10-CM

## 2023-06-09 DIAGNOSIS — Z0000 Encounter for general adult medical examination without abnormal findings: Secondary | ICD-10-CM

## 2023-06-09 NOTE — Telephone Encounter (Signed)
Pt was a no show for a cpe with Morrie Sheldon on 06/09/23,  no letter.

## 2023-06-09 NOTE — Telephone Encounter (Signed)
02/09/2023 no show 06/09/2023 no show  Final warning sent via mail and mychart

## 2023-06-09 NOTE — Telephone Encounter (Signed)
Provider aware

## 2023-06-16 NOTE — Telephone Encounter (Signed)
Pt stated she was incarcerated for these 2 no shows and wants to be excused. She wants another appt but has no transportation so she wants to wait until she hears back.

## 2023-06-17 NOTE — Telephone Encounter (Signed)
Okay to reschedule appt, but if another no show occurs then she will be dismissed.

## 2023-06-23 ENCOUNTER — Encounter: Payer: Self-pay | Admitting: Internal Medicine

## 2023-06-23 NOTE — Telephone Encounter (Signed)
Sent another final warning letter to pt that we understand extenuating circumstances and additional missed visits will lead to dismissal.

## 2023-07-09 ENCOUNTER — Emergency Department (HOSPITAL_BASED_OUTPATIENT_CLINIC_OR_DEPARTMENT_OTHER)
Admission: EM | Admit: 2023-07-09 | Discharge: 2023-07-09 | Disposition: A | Discharge status: Home / Self Care | Payer: MEDICAID | Attending: Emergency Medicine | Admitting: Emergency Medicine

## 2023-07-09 ENCOUNTER — Other Ambulatory Visit: Payer: Self-pay

## 2023-07-09 ENCOUNTER — Other Ambulatory Visit (HOSPITAL_BASED_OUTPATIENT_CLINIC_OR_DEPARTMENT_OTHER): Payer: Self-pay

## 2023-07-09 ENCOUNTER — Encounter (HOSPITAL_BASED_OUTPATIENT_CLINIC_OR_DEPARTMENT_OTHER): Payer: Self-pay | Admitting: Emergency Medicine

## 2023-07-09 ENCOUNTER — Emergency Department (HOSPITAL_BASED_OUTPATIENT_CLINIC_OR_DEPARTMENT_OTHER): Payer: MEDICAID

## 2023-07-09 DIAGNOSIS — J069 Acute upper respiratory infection, unspecified: Secondary | ICD-10-CM | POA: Insufficient documentation

## 2023-07-09 DIAGNOSIS — Z91040 Latex allergy status: Secondary | ICD-10-CM | POA: Insufficient documentation

## 2023-07-09 DIAGNOSIS — Z1152 Encounter for screening for COVID-19: Secondary | ICD-10-CM | POA: Diagnosis not present

## 2023-07-09 DIAGNOSIS — R059 Cough, unspecified: Secondary | ICD-10-CM | POA: Diagnosis present

## 2023-07-09 LAB — RESP PANEL BY RT-PCR (RSV, FLU A&B, COVID)  RVPGX2
Influenza A by PCR: NEGATIVE
Influenza B by PCR: NEGATIVE
Resp Syncytial Virus by PCR: NEGATIVE
SARS Coronavirus 2 by RT PCR: NEGATIVE

## 2023-07-09 MED ORDER — BENZONATATE 100 MG PO CAPS
100.0000 mg | ORAL_CAPSULE | Freq: Three times a day (TID) | ORAL | 0 refills | Status: DC
  Filled 2023-07-09: qty 21, 7d supply, fill #0

## 2023-07-09 MED ORDER — ALBUTEROL SULFATE HFA 108 (90 BASE) MCG/ACT IN AERS
2.0000 | INHALATION_SPRAY | RESPIRATORY_TRACT | Status: DC | PRN
  Administered 2023-07-09: 2 via RESPIRATORY_TRACT
  Filled 2023-07-09: qty 6.7

## 2023-07-09 MED ORDER — BENZONATATE 100 MG PO CAPS: 100.0000 mg | ORAL_CAPSULE | Freq: Three times a day (TID) | ORAL | 0 refills | Status: DC

## 2023-07-09 NOTE — ED Triage Notes (Addendum)
Cough , body aches , shortness of breath with exertion Nasal congestion .  Also reports had rash x 1 month while she was at prison . Treated for it yet no relief .

## 2023-07-09 NOTE — Discharge Instructions (Addendum)
You were seen for upper respiratory infection.  Please pick up your medication and take as prescribed.  You may use saline rinses for congestion as needed.  You may alternate taking Tylenol and Motrin as needed for pain.  Please do not take Motrin for greater than 5 days and arises may cause rebound headaches.  Thank you for letting us treat you today. After reviewing your labs and imaging, I feel you are safe to go home. Please follow up with your PCP in the next several days and provide them with your records from this visit. Return to the Emergency Room if pain becomes severe or symptoms worsen.

## 2023-07-09 NOTE — ED Provider Notes (Signed)
[place] [facility] Provider Note   CSN: [id number] Arrival date & time: 07/09/23  1104     History  Chief Complaint  Patient presents with   Cough    Cynthia Odonnell is a 37 y.o. female past medical history significant for substance use disorder, schizoaffective disorder, and anxiety presents today for cough, body aches, shortness of breath, congestion x 2 days.  Patient states she had similar symptoms while she was in prison.  Patient also notes intermittent rash which gets better with Benadryl use.  Patient denies nausea, vomiting, diarrhea, abdominal pain, fever or chest pain.   Cough Associated symptoms: myalgias, shortness of breath and sore throat        Home Medications Prior to Admission medications   Medication Sig Start Date End Date Taking? Authorizing Provider  benzonatate (TESSALON) 100 MG capsule Take 1 capsule (100 mg total) by mouth every 8 (eight) hours. 07/09/23  Yes Dolphus Jenny, PA-C      Allergies    Abilify [aripiprazole], Amoxicillin, Asenapine, Codeine, Latex, Penicillins, Prednisone, Augmentin [amoxicillin-pot clavulanate], Lamictal [lamotrigine], and Tramadol    Review of Systems   Review of Systems  HENT:  Positive for congestion and sore throat.   Respiratory:  Positive for cough and shortness of breath.   Musculoskeletal:  Positive for myalgias.    Physical Exam Updated Vital Signs BP 123/79   Pulse 77   Temp 97.9 F (36.6 C)   Resp 20   Wt 72.6 kg   LMP 06/18/2023 (Approximate)   SpO2 100%   BMI 25.82 kg/m  Physical Exam Vitals and nursing note reviewed.  Constitutional:      General: She is not in acute distress.    Appearance: She is well-developed.  HENT:     Head: Normocephalic and atraumatic.     Right Ear: External ear normal.     Left Ear: External ear normal.     Nose: Congestion present.     Mouth/Throat:     Pharynx: No oropharyngeal exudate or posterior oropharyngeal  erythema.  Eyes:     Conjunctiva/sclera: Conjunctivae normal.  Cardiovascular:     Rate and Rhythm: Normal rate and regular rhythm.     Heart sounds: No murmur heard. Pulmonary:     Effort: Pulmonary effort is normal. No respiratory distress.     Breath sounds: Normal breath sounds.  Abdominal:     Palpations: Abdomen is soft.     Tenderness: There is no abdominal tenderness.  Musculoskeletal:        General: No swelling.     Cervical back: Normal range of motion and neck supple.  Skin:    General: Skin is warm and dry.     Capillary Refill: Capillary refill takes less than 2 seconds.     Comments: Faint, mildly erythematous rash on arms and legs sparing palms and soles of feet  Neurological:     General: No focal deficit present.     Mental Status: She is alert.  Psychiatric:        Mood and Affect: Mood normal.     ED Results / Procedures / Treatments   Labs (all labs ordered are listed, but only abnormal results are displayed) Labs Reviewed  RESP PANEL BY RT-PCR (RSV, FLU A&B, COVID)  [id number]    EKG None  Radiology DG Chest 2 View  Result Date: 07/09/2023 CLINICAL DATA:  Shortness of breath. Cough. Body aches and chills for a couple of  days. EXAM: CHEST - 2 VIEW COMPARISON:  Chest radiographs 01/18/2023 and 07/29/2018 FINDINGS: Cardiac silhouette and mediastinal contours are within limits. Lungs are clear. There is flattening of the diaphragms and mild hyperinflation, similar to prior. No focal airspace opacity. No pleural effusion pneumothorax. Mild multilevel degenerative disc changes of the thoracic spine. IMPRESSION: 1. No active cardiopulmonary disease. 2. Mild hyperinflation, similar to prior. Electronically Signed   By: Neita Garnet M.D.   On: 07/09/2023 13:39    Procedures Procedures    Medications Ordered in ED Medications  albuterol (VENTOLIN HFA) 108 (90 Base) MCG/ACT inhaler 2 puff (2 puffs Inhalation Given 07/09/23 1141)    ED Course/ Medical Decision  Making/ A&P                                 Medical Decision Making Amount and/or Complexity of Data Reviewed Radiology: ordered.  Risk Prescription drug management.   This patient presents to the ED with chief complaint(s) of URI symptoms with pertinent past medical history of none which further complicates the presenting complaint. The complaint involves an extensive differential diagnosis and also carries with it a high risk of complications and morbidity.    The differential diagnosis includes pneumonia, COVID, flu, upper respiratory infection   ED Course and Reassessment:   Independent labs interpretation:  The following labs were independently interpreted:  EKG: Sinus with PVCs, short PR interval Respiratory panel: Negative  Independent visualization of imaging: - I independently visualized the following imaging with scope of interpretation limited to determining acute life threatening conditions related to emergency care: Chest x-ray, which revealed mild hyperinflation which is similar to previous chest x-rays  Consultation: - Consulted or discussed management/test interpretation w/ external professional: None  Consideration for admission or further workup: Considered for admission or further workup however patient's vital signs, physical exam, labs, and imaging of all been reassuring.  Patient symptoms likely due to upper respiratory infection.  Patient will be treated symptomatically with Jerilynn Som and over-the-counter treatment.  Patient should follow-up with PCP if symptoms persist for further evaluation and treatment.        Final Clinical Impression(s) / ED Diagnoses Final diagnoses:  Upper respiratory tract infection, unspecified type    Rx / DC Orders ED Discharge Orders          Ordered    benzonatate (TESSALON) 100 MG capsule  Every 8 hours        07/09/23 1350              Dolphus Jenny, PA-C 07/09/23 1352    Royanne Foots,  DO 07/12/23 1827

## 2023-07-10 ENCOUNTER — Telehealth: Payer: Self-pay

## 2023-07-10 ENCOUNTER — Ambulatory Visit: Payer: MEDICAID | Admitting: Internal Medicine

## 2023-07-10 NOTE — Transitions of Care (Post Inpatient/ED Visit) (Signed)
   07/10/2023  Name: Cynthia Odonnell MRN: [id number] DOB: 1985/12/14  Today's TOC FU Call Status: Today's TOC FU Call Status:: Successful TOC FU Call Completed TOC FU Call Complete Date: 07/10/23 Patient's Name and Date of Birth confirmed.  Transition Care Management Follow-up Telephone Call Date of Discharge: 07/09/23 Discharge Facility: Other ([facility]) Name of Other ([facility]) Discharge Facility: [facility] Type of Discharge: Emergency Department How have you been since you were released from the hospital?: Better  Items Reviewed: Did you receive and understand the discharge instructions provided?: Yes Any new allergies since your discharge?: No Dietary orders reviewed?: NA Do you have support at home?: Yes  Medications Reviewed Today: Medications Reviewed Today   Medications were not reviewed in this encounter     Home Care and Equipment/Supplies: Were Home Health Services Ordered?: NA Any new equipment or medical supplies ordered?: NA  Functional Questionnaire: Do you need assistance with bathing/showering or dressing?: No Do you need assistance with meal preparation?: No Do you need assistance with eating?: No Do you have difficulty maintaining continence: No Do you need assistance with getting out of bed/getting out of a chair/moving?: No Do you have difficulty managing or taking your medications?: No  Follow up appointments reviewed: PCP Follow-up appointment confirmed?: Yes Date of PCP follow-up appointment?: 07/13/23 Follow-up Provider: [facility] Follow-up appointment confirmed?: NA Do you need transportation to your follow-up appointment?: No Do you understand care options if your condition(s) worsen?: Yes-patient verbalized understanding    SIGNATURE Arvil Persons, BSN, RN

## 2023-07-13 ENCOUNTER — Other Ambulatory Visit (HOSPITAL_COMMUNITY)
Admission: RE | Admit: 2023-07-13 | Discharge: 2023-07-13 | Disposition: A | Discharge status: Home / Self Care | Payer: MEDICAID | Source: Ambulatory Visit | Attending: Internal Medicine | Admitting: Internal Medicine

## 2023-07-13 ENCOUNTER — Encounter: Payer: Self-pay | Admitting: Internal Medicine

## 2023-07-13 ENCOUNTER — Ambulatory Visit (INDEPENDENT_AMBULATORY_CARE_PROVIDER_SITE_OTHER): Payer: No Typology Code available for payment source | Admitting: Internal Medicine

## 2023-07-13 VITALS — BP 100/64 | HR 73 | Temp 98.2°F | Ht 66.0 in | Wt 179.2 lb

## 2023-07-13 DIAGNOSIS — Z114 Encounter for screening for human immunodeficiency virus [HIV]: Secondary | ICD-10-CM

## 2023-07-13 DIAGNOSIS — Z124 Encounter for screening for malignant neoplasm of cervix: Secondary | ICD-10-CM | POA: Diagnosis not present

## 2023-07-13 DIAGNOSIS — Z113 Encounter for screening for infections with a predominantly sexual mode of transmission: Secondary | ICD-10-CM | POA: Diagnosis not present

## 2023-07-13 DIAGNOSIS — Z1159 Encounter for screening for other viral diseases: Secondary | ICD-10-CM

## 2023-07-13 DIAGNOSIS — T148XXA Other injury of unspecified body region, initial encounter: Secondary | ICD-10-CM

## 2023-07-13 DIAGNOSIS — F1990 Other psychoactive substance use, unspecified, uncomplicated: Secondary | ICD-10-CM

## 2023-07-13 DIAGNOSIS — L309 Dermatitis, unspecified: Secondary | ICD-10-CM

## 2023-07-13 DIAGNOSIS — Z0001 Encounter for general adult medical examination with abnormal findings: Secondary | ICD-10-CM | POA: Diagnosis not present

## 2023-07-13 DIAGNOSIS — Z975 Presence of (intrauterine) contraceptive device: Secondary | ICD-10-CM

## 2023-07-13 LAB — LIPID PANEL
Cholesterol: 179 mg/dL (ref 0–200)
HDL: 50 mg/dL (ref >39.00)
LDL Cholesterol: 118 mg/dL — ABNORMAL HIGH (ref 0–99)
NonHDL: 128.65
Total CHOL/HDL Ratio: 4
Triglycerides: 53 mg/dL (ref 0.0–149.0)
VLDL: 10.6 mg/dL (ref 0.0–40.0)

## 2023-07-13 LAB — COMPREHENSIVE METABOLIC PANEL
ALT: 9 U/L (ref 0–35)
AST: 13 U/L (ref 0–37)
Albumin: 3.9 g/dL (ref 3.5–5.2)
Alkaline Phosphatase: 61 U/L (ref 39–117)
BUN: 16 mg/dL (ref 6–23)
CO2: 25 meq/L (ref 19–32)
Calcium: 8.4 mg/dL (ref 8.4–10.5)
Chloride: 103 meq/L (ref 96–112)
Creatinine, Ser: 0.7 mg/dL (ref 0.40–1.20)
GFR: 110.67 mL/min (ref >60.00)
Glucose, Bld: 89 mg/dL (ref 70–99)
Potassium: 4.3 meq/L (ref 3.5–5.1)
Sodium: 137 meq/L (ref 135–145)
Total Bilirubin: 0.4 mg/dL (ref 0.2–1.2)
Total Protein: 6.4 g/dL (ref 6.0–8.3)

## 2023-07-13 LAB — TSH: TSH: 1.15 u[IU]/mL (ref 0.35–5.50)

## 2023-07-13 MED ORDER — METHOCARBAMOL 500 MG PO TABS
500.0000 mg | ORAL_TABLET | Freq: Three times a day (TID) | ORAL | 0 refills | Status: AC | PRN
Start: 2023-07-13 — End: ?

## 2023-07-13 MED ORDER — TRIAMCINOLONE ACETONIDE 0.1 % EX CREA
1.0000 | TOPICAL_CREAM | Freq: Two times a day (BID) | CUTANEOUS | 1 refills | Status: AC
Start: 2023-07-13 — End: ?
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
## 2023-07-14 ENCOUNTER — Telehealth: Payer: Self-pay | Admitting: Internal Medicine

## 2023-07-14 LAB — CERVICOVAGINAL ANCILLARY ONLY
Bacterial Vaginitis (gardnerella): POSITIVE — AB
Candida Glabrata: NEGATIVE
Candida Vaginitis: NEGATIVE
Chlamydia: NEGATIVE
Comment: NEGATIVE
Comment: NEGATIVE
Comment: NEGATIVE
Comment: NEGATIVE
Comment: NEGATIVE
Comment: NORMAL
Neisseria Gonorrhea: NEGATIVE
Trichomonas: NEGATIVE

## 2023-07-14 NOTE — Telephone Encounter (Signed)
Pt called to say she was just seen yesterday, but right now her throat is swollen with white spots on them. I transferred her to Nurse Triage

## 2023-07-14 NOTE — Telephone Encounter (Signed)
Will need to be seen in office

## 2023-07-15 ENCOUNTER — Encounter: Payer: Self-pay | Admitting: Internal Medicine

## 2023-07-15 ENCOUNTER — Other Ambulatory Visit (HOSPITAL_COMMUNITY)
Admission: RE | Admit: 2023-07-15 | Discharge: 2023-07-15 | Disposition: A | Discharge status: Home / Self Care | Payer: MEDICAID | Source: Ambulatory Visit | Attending: Internal Medicine | Admitting: Internal Medicine

## 2023-07-15 ENCOUNTER — Ambulatory Visit (INDEPENDENT_AMBULATORY_CARE_PROVIDER_SITE_OTHER): Payer: No Typology Code available for payment source | Admitting: Internal Medicine

## 2023-07-15 VITALS — BP 110/78 | HR 79 | Temp 98.1°F | Ht 66.0 in | Wt 176.4 lb

## 2023-07-15 DIAGNOSIS — B9689 Other specified bacterial agents as the cause of diseases classified elsewhere: Secondary | ICD-10-CM

## 2023-07-15 DIAGNOSIS — T148XXA Other injury of unspecified body region, initial encounter: Secondary | ICD-10-CM

## 2023-07-15 DIAGNOSIS — A530 Latent syphilis, unspecified as early or late: Secondary | ICD-10-CM | POA: Diagnosis not present

## 2023-07-15 DIAGNOSIS — N760 Acute vaginitis: Secondary | ICD-10-CM | POA: Diagnosis not present

## 2023-07-15 DIAGNOSIS — J029 Acute pharyngitis, unspecified: Secondary | ICD-10-CM

## 2023-07-15 DIAGNOSIS — Z113 Encounter for screening for infections with a predominantly sexual mode of transmission: Secondary | ICD-10-CM | POA: Insufficient documentation

## 2023-07-15 LAB — CBC WITH DIFFERENTIAL/PLATELET
Basophils Absolute: 0 10*3/uL (ref 0.0–0.1)
Basophils Relative: 0.5 % (ref 0.0–3.0)
Eosinophils Absolute: 0.1 10*3/uL (ref 0.0–0.7)
Eosinophils Relative: 1.1 % (ref 0.0–5.0)
HCT: 39.3 % (ref 36.0–46.0)
Hemoglobin: 13.1 g/dL (ref 12.0–15.0)
Lymphocytes Relative: 32.7 % (ref 12.0–46.0)
Lymphs Abs: 2.3 10*3/uL (ref 0.7–4.0)
MCHC: 33.4 g/dL (ref 30.0–36.0)
MCV: 83.4 fL (ref 78.0–100.0)
Monocytes Absolute: 0.5 10*3/uL (ref 0.1–1.0)
Monocytes Relative: 6.7 % (ref 3.0–12.0)
Neutro Abs: 4.2 10*3/uL (ref 1.4–7.7)
Neutrophils Relative %: 59 % (ref 43.0–77.0)
Platelets: 274 10*3/uL (ref 150.0–400.0)
RBC: 4.72 Mil/uL (ref 3.87–5.11)
RDW: 14.7 % (ref 11.5–15.5)
WBC: 7.1 10*3/uL (ref 4.0–10.5)

## 2023-07-15 LAB — CYTOLOGY - PAP
Comment: NEGATIVE
High risk HPV: POSITIVE — AB

## 2023-07-15 LAB — POCT RAPID STREP A (OFFICE): Rapid Strep A Screen: NEGATIVE

## 2023-07-15 MED ORDER — DICLOFENAC SODIUM 1 % EX GEL
4.0000 g | Freq: Four times a day (QID) | CUTANEOUS | 1 refills | Status: AC
Start: 2023-07-15 — End: ?

## 2023-07-15 MED ORDER — AZITHROMYCIN 250 MG PO TABS
ORAL_TABLET | ORAL | 0 refills | Status: DC
Start: 2023-07-15 — End: 2023-07-17

## 2023-07-15 MED ORDER — METRONIDAZOLE 500 MG PO TABS
500.0000 mg | ORAL_TABLET | Freq: Two times a day (BID) | ORAL | 0 refills | Status: AC
Start: 2023-07-15 — End: 2023-07-22

## 2023-07-15 NOTE — Patient Instructions (Addendum)
Warm Salt water gargles  OTC Chloraseptic spray Throat lozenges  Tylenol and ibuprofen   Take Azithromycin as prescribed. THEN after completing it, then start the Metronidazole   DO NOT DRINK ALCOHOL WHILE TAKING ANTIBIOTICS

## 2023-07-15 NOTE — Progress Notes (Signed)
[facility] [address] [name] [address] Dept: [phone] Dept Fax: [phone]  Acute Care Office Visit  Subjective:   Cynthia Odonnell ONGEXBMW 03/24/1986 07/15/2023  Chief Complaint  Patient presents with   Sore Throat    HPI: Discussed the use of AI scribe software for clinical note transcription with the patient, who gave verbal consent to proceed.  History of Present Illness   The patient presents with a sore throat, described as 'red and splotchy' and 'very sore.' Onset 1 day ago. She reports a history of gonorrhea of the throat treated with "a shot" approximately five months ago. She denies recent sexual activity since the gonorrhea diagnosis. No fever, sinus congestion, ear ache.   In addition, the patient is awaiting confirmatory testing for a reactive syphilis test that was performed 2 days ago. She denies any rash or sores, but expresses concern about the potential diagnosis and its implications. She also tested positive for BV.   The patient also mentions leg pain which was discussed at her prior visit 2 days ago, for which she has been taking Robaxin. She expresses interest in lidocaine patches or Voltaren gel for additional relief, but notes financial constraints.      The following portions of the patient's history were reviewed and updated as appropriate: past medical history, past surgical history, family history, social history, allergies, medications, and problem list.   Patient Active Problem List   Diagnosis Date Noted   Cocaine use disorder (HCC) 09/24/2022   Schizoaffective disorder, bipolar type (HCC) 12/25/2020   Psychophysiological insomnia 12/25/2020   Anxiety state 12/25/2020   Cocaine use disorder, severe, dependence (HCC) 04/05/2020   Bipolar 1 disorder, mixed, moderate (HCC) 04/25/2019   Substance use disorder 04/16/2019   Substance induced mood disorder (HCC) 03/06/2018   Bipolar 1  disorder (HCC)    Bradycardia    Cocaine abuse with intoxication and without complication (HCC)    Hypotension    Smoking    Past Medical History:  Diagnosis Date   Abnormal Pap smear of cervix    Anxiety    Arthritis    Bronchitis    Hx - uses albuterol inhaler prn   Cocaine abuse, in remission (HCC)    Depression    GERD (gastroesophageal reflux disease)    zantac prn   H/O cesarean section 11/04/2014   Headache(784.0)    otc prn   Optic neuritis    left eye   Smoker    Suicide attempt [facility])    Termination of pregnancy (fetus)    x 2   Uterine scar from previous cesarean delivery, antepartum complication    Past Surgical History:  Procedure Laterality Date   CESAREAN SECTION  07/31/2008   [facility]   CESAREAN SECTION N/A 11/04/2014   Procedure: CESAREAN SECTION;  Surgeon: Tracey Harries, MD;  Location: [facility];  Service: Obstetrics;  Laterality: N/A;   COLPOSCOPY     WISDOM TOOTH EXTRACTION     Family History  Problem Relation Age of Onset   Other Neg Hx     Current Outpatient Medications:    azithromycin (ZITHROMAX) 250 MG tablet, Take 2 tablets on day 1, then 1 tablet daily on days 2 through 5, Disp: 6 tablet, Rfl: 0   benzonatate (TESSALON) 100 MG capsule, Take 1 capsule (100 mg total) by mouth every 8 (eight) hours., Disp: 21 capsule, Rfl: 0   methocarbamol (ROBAXIN) 500 MG tablet, Take 1 tablet (500 mg total) by  mouth every 8 (eight) hours as needed for muscle spasms., Disp: 30 tablet, Rfl: 0   metroNIDAZOLE (FLAGYL) 500 MG tablet, Take 1 tablet (500 mg total) by mouth 2 (two) times daily for 7 days., Disp: 14 tablet, Rfl: 0   triamcinolone cream (KENALOG) 0.1 %, Apply 1 Application topically 2 (two) times daily., Disp: 45 g, Rfl: 1 Allergies  Allergen Reactions   Abilify [Aripiprazole] Other (See Comments)    Tardive dyskinesia   Amoxicillin Hives   Asenapine Itching and Other (See Comments)    Face paralysis   Codeine Itching   Latex Itching   Penicillins Hives  and Other (See Comments)    Has patient had a PCN reaction causing immediate rash, facial/tongue/throat swelling, SOB or lightheadedness with hypotension: No Has patient had a PCN reaction causing severe rash involving mucus membranes or skin necrosis: No Has patient had a PCN reaction that required hospitalization: No Has patient had a PCN reaction occurring within the last 10 years: No If all of the above answers are "NO", then may proceed with Cephalosporin use.   Prednisone Itching   Augmentin [Amoxicillin-Pot Clavulanate] Hives, Rash and Other (See Comments)    Dizziness   Lamictal [Lamotrigine] Rash   Tramadol Itching and Rash     ROS: A complete ROS was performed with pertinent positives/negatives noted in the HPI. The remainder of the ROS are negative.    Objective:   Today's Vitals   07/15/23 1336  BP: 110/78  Pulse: 79  Temp: 98.1 F (36.7 C)  TempSrc: Temporal  SpO2: 99%  Weight: 176 lb 6.4 oz (80 kg)  Height: 5\' 6"  (1.676 m)    GENERAL: Well-appearing, in NAD. Well nourished.  SKIN: Pink, warm and dry. No rash, lesion, ulceration, or ecchymoses.  HEENT:    HEAD: Normocephalic, non-traumatic.  EYES: Conjunctive pink without exudate. PERRL, EOMI.  EARS: External ear w/o redness, swelling, masses, or lesions. EAC clear. TM's intact, translucent w/o bulging, appropriate landmarks visualized.  NOSE: Septum midline w/o deformity. Nares patent, mucosa pink and non-inflamed w/o drainage. No sinus tenderness.  THROAT: Uvula midline. Oropharynx clear. Tonsils erythematous, 2+, with white exudate bilaterally.  Mucus membranes pink and moist.  NECK: Trachea midline. Full ROM w/o pain or tenderness. No lymphadenopathy.  RESPIRATORY: Chest wall symmetrical. Respirations even and non-labored. Breath sounds clear to auscultation bilaterally.  CARDIAC: S1, S2 present, regular rate and rhythm. Peripheral pulses 2+ bilaterally.  EXTREMITIES: Without clubbing, cyanosis, or edema.   NEUROLOGIC: Steady, even gait.  PSYCH/MENTAL STATUS: Alert, oriented x 3. Cooperative, appropriate mood and affect.    Results for orders placed or performed in visit on 07/15/23  POCT rapid strep A  Result Value Ref Range   Rapid Strep A Screen Negative Negative      Assessment & Plan:  Assessment and Plan    Pharyngitis - POC rapid strep test is negative  - obtain throat culture for group A strep and GC/CH - Warm salt water gargles, throat lozenges, chloraseptic spray  - will start Azithromycin to treat for strep based on clinical findings.    Bacterial Vaginosis Detected on vaginal swab at prior visit 2 days ago  Treat with Flagyl 500mg  BID x 7 days   Possible Syphilis Initial test reactive, awaiting confirmatory testing. No current symptoms suggestive of syphilis. -Await confirmatory test results.   Musculoskeletal Pain - Voltaren Gel QID PRN pain      Meds ordered this encounter  Medications   metroNIDAZOLE (FLAGYL) 500 MG tablet  Sig: Take 1 tablet (500 mg total) by mouth 2 (two) times daily for 7 days.    Dispense:  14 tablet    Refill:  0    Order Specific Question:   Supervising Provider    Answer:   Garnette Gunner [[id number]]   azithromycin (ZITHROMAX) 250 MG tablet    Sig: Take 2 tablets on day 1, then 1 tablet daily on days 2 through 5    Dispense:  6 tablet    Refill:  0    Order Specific Question:   Supervising Provider    Answer:   Garnette Gunner [[id number]]   Orders Placed This Encounter  Procedures   Culture, Group A Strep    Order Specific Question:   Source    Answer:   throat   CBC with Differential/Platelet    Standing Status:   Future    Number of Occurrences:   1    Standing Expiration Date:   07/14/2024   POCT rapid strep A   Lab Orders         Culture, Group A Strep         CBC with Differential/Platelet         POCT rapid strep A     No images are attached to the encounter or orders placed in the encounter.  Return if  symptoms worsen or fail to improve.   Salvatore Decent, FNP

## 2023-07-15 NOTE — Telephone Encounter (Signed)
Patient scheduled for this afternoon.

## 2023-07-16 ENCOUNTER — Telehealth: Payer: Self-pay | Admitting: Internal Medicine

## 2023-07-16 LAB — T PALLIDUM AB: T Pallidum Abs: POSITIVE — AB

## 2023-07-16 LAB — CYTOLOGY, (ORAL, ANAL, URETHRAL) ANCILLARY ONLY
Chlamydia: NEGATIVE
Comment: NEGATIVE
Comment: NORMAL
Neisseria Gonorrhea: NEGATIVE

## 2023-07-16 LAB — HEPATITIS C ANTIBODY: Hepatitis C Ab: NONREACTIVE

## 2023-07-16 LAB — HIV ANTIBODY (ROUTINE TESTING W REFLEX): HIV 1&2 Ab, 4th Generation: NONREACTIVE

## 2023-07-16 LAB — RPR: RPR Ser Ql: REACTIVE — AB

## 2023-07-16 LAB — RPR TITER: RPR Titer: 1:128 {titer} — ABNORMAL HIGH

## 2023-07-16 NOTE — Telephone Encounter (Signed)
Atilano Ina " corey " from [facility] de[facility] [phone]  Called and wanted to discuss about the patient of syphilis

## 2023-07-16 NOTE — Telephone Encounter (Signed)
FYI

## 2023-07-17 ENCOUNTER — Other Ambulatory Visit: Payer: Self-pay | Admitting: Internal Medicine

## 2023-07-17 ENCOUNTER — Telehealth: Payer: Self-pay | Admitting: Internal Medicine

## 2023-07-17 DIAGNOSIS — A539 Syphilis, unspecified: Secondary | ICD-10-CM | POA: Insufficient documentation

## 2023-07-17 LAB — CULTURE, GROUP A STREP
Micro Number: 15837732
SPECIMEN QUALITY:: ADEQUATE

## 2023-07-17 MED ORDER — DOXYCYCLINE HYCLATE 100 MG PO TABS: 100.0000 mg | ORAL_TABLET | Freq: Two times a day (BID) | ORAL | 0 refills | Status: AC

## 2023-07-17 NOTE — Telephone Encounter (Signed)
Results have been relayed to the patient. The patient verbalized understanding. No questions at this time.   

## 2023-07-17 NOTE — Telephone Encounter (Signed)
Pt want to know if any of her test results are in yet

## 2023-07-21 ENCOUNTER — Telehealth: Payer: Self-pay | Admitting: Internal Medicine

## 2023-07-21 NOTE — Telephone Encounter (Signed)
error 

## 2023-07-21 NOTE — Telephone Encounter (Signed)
Cynthia Odonnell from [phone] want to fiollow up on patient lab results from 12.09.2024

## 2023-07-22 ENCOUNTER — Telehealth: Payer: Self-pay | Admitting: Internal Medicine

## 2023-07-22 NOTE — Telephone Encounter (Signed)
Call returned. LVM to call back

## 2023-07-22 NOTE — Telephone Encounter (Signed)
Copied from CRM [id number]. Topic: Referral - Question >> Jul 22, 2023  9:22 AM Alona Bene A wrote: Reason for CRM: Patient is requesting referral for OBGYN, patient stated that she was waiting on referral but has not received it yet. Please contact patient or respond via MyChart with request. Patient can be reached at [phone].

## 2023-07-22 NOTE — Telephone Encounter (Signed)
Please call pt to advise of multiple contact attempts & provider her office info.  [facility] Information  Phone Fax Address  [phone] [place] [address]

## 2023-07-22 NOTE — Telephone Encounter (Signed)
Returned call. LVM to call back.

## 2023-07-23 ENCOUNTER — Telehealth: Payer: Self-pay

## 2023-07-23 NOTE — Telephone Encounter (Addendum)
See previous message

## 2023-07-23 NOTE — Telephone Encounter (Addendum)
Source  Kara Dies (Patient)   Subject  Brennan, Ostrem (Patient)   Topic  Clinical - Medical Advice    Communication  Reason for ION:GEXBMWU called and would like to know if they are able to resume sexual activities at this time and does not have access to MyChart so would prefer a call back.   Please advise

## 2023-07-23 NOTE — Telephone Encounter (Signed)
Copied from CRM [id number]. Topic: General - Call Back - No Documentation >> Jul 23, 2023  4:07 PM Corin V wrote: Reason for QIO:NGEXBMW returned missed call from office. Agent did no see any notes in chart. Nurse was unavailable to take call. Advised patient staff will call back regarding call.

## 2023-07-23 NOTE — Telephone Encounter (Signed)
Attempted to call patient to inform her she should wait 7 days after completing antibiotics before having sex and use condoms.  Follow up with Morrie Sheldon in 6 month appt scheduled.  Unable to reach patient or leave voicemail.

## 2023-07-24 NOTE — Telephone Encounter (Signed)
Patient informed of message  

## 2023-09-11 ENCOUNTER — Ambulatory Visit: Payer: MEDICAID | Admitting: Obstetrics and Gynecology

## 2023-09-11 ENCOUNTER — Encounter: Payer: Self-pay | Admitting: Obstetrics and Gynecology

## 2023-09-11 VITALS — BP 105/60 | HR 75 | Wt 174.0 lb

## 2023-09-11 DIAGNOSIS — Z3009 Encounter for other general counseling and advice on contraception: Secondary | ICD-10-CM | POA: Diagnosis not present

## 2023-09-11 DIAGNOSIS — Z3046 Encounter for surveillance of implantable subdermal contraceptive: Secondary | ICD-10-CM | POA: Diagnosis not present

## 2023-09-11 DIAGNOSIS — Z3202 Encounter for pregnancy test, result negative: Secondary | ICD-10-CM | POA: Diagnosis not present

## 2023-09-11 LAB — POCT URINE PREGNANCY: Preg Test, Ur: NEGATIVE

## 2023-09-11 MED ORDER — ETONOGESTREL 68 MG ~~LOC~~ IMPL
68.0000 mg | DRUG_IMPLANT | Freq: Once | SUBCUTANEOUS | Status: AC
  Administered 2023-09-11: 68 mg via SUBCUTANEOUS

## 2023-09-11 NOTE — Progress Notes (Signed)
 NEW GYNECOLOGY PATIENT Patient name: Cynthia Odonnell MRN [id number]  Date of birth: 07-10-1986 Chief Complaint:   No chief complaint on file.     History:  Cynthia Odonnell is a 38 y.o. [phone] being seen today for nexplanon  removal. Has had nexplanon  in since 2016. During 3 years it was active did not have menstrual mood dysfunction that she is aware of and had normal menses. Patient disclosed dealt with substance abuse and not as aware how her mood was related to her menses and now that she is more clear minded has noted a pattern. She is sure she wants a contraceptive and likes  that nexplanon  is very effective.         Gynecologic History No LMP recorded (lmp unknown). Contraception: Nexplanon  Last Pap:     Component Value Date/Time   DIAGPAP - Low grade squamous intraepithelial lesion (LSIL) (A) 07/13/2023 1055   HPVHIGH Positive (A) 07/13/2023 1055   ADEQPAP  07/13/2023 1055    Satisfactory for evaluation; transformation zone component PRESENT.    High Risk HPV: Positive  Adequacy:  Satisfactory for evaluation, transformation zone component PRESENT  Diagnosis:  Atypical squamous cells of undetermined significance (ASC-US )  Last Mammogram: n/a Last Colonoscopy: n/a  Obstetric History OB History  Gravida Para Term Preterm AB Living  4 2 2  2 2   SAB IAB Ectopic Multiple Live Births   2  0 2    # Outcome Date GA Lbr Len/2nd Weight Sex Type Anes PTL Lv  4 Term 11/04/14 [redacted]w[redacted]d  5 lb 13.5 oz (2.65 kg) F CS-LTranv Spinal, EPI  LIV  3 Term 07/31/08 [redacted]w[redacted]d  7 lb 14 oz (3.572 kg) M  EPI  LIV  2 IAB 08/30/06 [redacted]w[redacted]d         1 IAB 08/07/04 [redacted]w[redacted]d           Past Medical History:  Diagnosis Date   Abnormal Pap smear of cervix    Anxiety    Arthritis    Bronchitis    Hx - uses albuterol  inhaler prn   Cocaine abuse, in remission (HCC)    Depression    GERD (gastroesophageal reflux disease)    zantac prn   H/O cesarean section 11/04/2014   Headache(784.0)    otc prn   Optic  neuritis    left eye   Smoker    Suicide attempt [facility])    Termination of pregnancy (fetus)    x 2   Uterine scar from previous cesarean delivery, antepartum complication     Past Surgical History:  Procedure Laterality Date   CESAREAN SECTION  07/31/2008   [facility]   CESAREAN SECTION N/A 11/04/2014   Procedure: CESAREAN SECTION;  Surgeon: Debby Lares, MD;  Location: [facility];  Service: Obstetrics;  Laterality: N/A;   COLPOSCOPY     WISDOM TOOTH EXTRACTION      Current Outpatient Medications on File Prior to Visit  Medication Sig Dispense Refill   clindamycin  (CLEOCIN ) 150 MG capsule Take 150 mg by mouth 4 (four) times daily.     benzonatate  (TESSALON ) 100 MG capsule Take 1 capsule (100 mg total) by mouth every 8 (eight) hours. (Patient not taking: Reported on 09/11/2023) 21 capsule 0   diclofenac  Sodium (VOLTAREN ) 1 % GEL Apply 4 g topically 4 (four) times daily. (Patient not taking: Reported on 09/11/2023) 150 g 1   methocarbamol  (ROBAXIN ) 500 MG tablet Take 1 tablet (500 mg total) by mouth every 8 (eight) hours as needed  for muscle spasms. (Patient not taking: Reported on 09/11/2023) 30 tablet 0   triamcinolone  cream (KENALOG ) 0.1 % Apply 1 Application topically 2 (two) times daily. (Patient not taking: Reported on 09/11/2023) 45 g 1   No current facility-administered medications on file prior to visit.    Allergies  Allergen Reactions   Abilify  [Aripiprazole ] Other (See Comments)    Tardive dyskinesia   Amoxicillin Hives   Asenapine Itching and Other (See Comments)    Face paralysis   Codeine Itching   Latex Itching   Penicillins Hives and Other (See Comments)    Has patient had a PCN reaction causing immediate rash, facial/tongue/throat swelling, SOB or lightheadedness with hypotension: No Has patient had a PCN reaction causing severe rash involving mucus membranes or skin necrosis: No Has patient had a PCN reaction that required hospitalization: No Has patient had a PCN reaction  occurring within the last 10 years: No If all of the above answers are NO, then may proceed with Cephalosporin use.   Prednisone  Itching   Augmentin [Amoxicillin-Pot Clavulanate] Hives, Rash and Other (See Comments)    Dizziness   Lamictal [Lamotrigine] Rash   Tramadol Itching and Rash    Social History:  reports that she has been smoking cigarettes. She has a 8 pack-year smoking history. She has never used smokeless tobacco. She reports current alcohol use. She reports current drug use. Frequency: 3.00 times per week. Drugs: Cocaine, Methamphetamines, and Marijuana.  Family History  Problem Relation Age of Onset   Diabetes Mother    Bipolar disorder Mother    Heart disease Mother    Other Neg Hx     The following portions of the patient's history were reviewed and updated as appropriate: allergies, current medications, past family history, past medical history, past social history, past surgical history and problem list.  Review of Systems Pertinent items noted in HPI and remainder of comprehensive ROS otherwise negative.  Physical Exam:  BP 105/60 (BP Location: Right Arm, Patient Position: Sitting, Cuff Size: Large)   Pulse 75   Wt 174 lb (78.9 kg)   LMP  (LMP Unknown)   BMI 28.08 kg/m  Physical Exam Vitals and nursing note reviewed.  Constitutional:      Appearance: Normal appearance.  Cardiovascular:     Rate and Rhythm: Normal rate.  Pulmonary:     Effort: Pulmonary effort is normal.     Breath sounds: Normal breath sounds.  Neurological:     General: No focal deficit present.     Mental Status: She is alert and oriented to person, place, and time.  Psychiatric:        Mood and Affect: Mood normal.        Behavior: Behavior normal.        Thought Content: Thought content normal.        Judgment: Judgment normal.    Nexplanon  Removal and Reinsertion Patient identified, informed consent performed, consent signed.   Patient does understand that irregular  bleeding is a very common side effect of this medication. She was advised to have backup contraception for one week after replacement of the implant. Pregnancy test in clinic today was negative.  Appropriate time out taken. Nexplanon  site identified in left arm.  Area prepped in usual sterile fashon. One ml of 1% lidocaine  was used to anesthetize the area at the distal end of the implant. A small stab incision was made right beside the implant on the distal portion. The Nexplanon  rod was grasped  and removed without difficulty. There was minimal blood loss. There were no complications. New area was then injected with 3 ml of 1 % lidocaine . She was re-prepped with betadine, Nexplanon  removed from packaging, Device confirmed in needle, then inserted full length of needle and withdrawn per handbook instructions. Nexplanon  was able to palpated in the patient's arm; patient palpated the insert herself.  There was minimal blood loss. Patient insertion site covered with gauze and a pressure bandage to reduce any bruising. The patient tolerated the procedure well and was given post procedure instructions.  She was advised to have backup contraception for one w     Assessment and Plan:   1. Encounter for removal and reinsertion of Nexplanon  (Primary) Now s/p uncomplicated nexplanon  removal and insertion.  - etonogestrel  (NEXPLANON ) implant 68 mg - POCT urine pregnancy  Will return for colposcopy    Routine preventative health maintenance measures emphasized. Please refer to After Visit Summary for other counseling recommendations.   Follow-up: No follow-ups on file.      Carter Quarry, MD Obstetrician & Gynecologist, Faculty Practice Minimally Invasive Gynecologic Surgery Center for [employer], [facility]

## 2023-09-16 ENCOUNTER — Telehealth: Payer: Self-pay | Admitting: Internal Medicine

## 2023-09-16 NOTE — Telephone Encounter (Signed)
 ERROR

## 2023-09-23 ENCOUNTER — Ambulatory Visit: Payer: No Typology Code available for payment source | Admitting: Internal Medicine

## 2023-10-01 ENCOUNTER — Ambulatory Visit: Payer: No Typology Code available for payment source | Admitting: Internal Medicine

## 2023-10-12 ENCOUNTER — Other Ambulatory Visit (HOSPITAL_COMMUNITY)
Admission: RE | Admit: 2023-10-12 | Discharge: 2023-10-12 | Disposition: A | Discharge status: Home / Self Care | Source: Ambulatory Visit | Attending: Obstetrics and Gynecology | Admitting: Obstetrics and Gynecology

## 2023-10-12 ENCOUNTER — Ambulatory Visit: Payer: No Typology Code available for payment source | Admitting: Obstetrics and Gynecology

## 2023-10-12 ENCOUNTER — Other Ambulatory Visit (HOSPITAL_BASED_OUTPATIENT_CLINIC_OR_DEPARTMENT_OTHER): Payer: Self-pay

## 2023-10-12 VITALS — BP 105/59 | HR 67 | Wt 170.0 lb

## 2023-10-12 DIAGNOSIS — R87612 Low grade squamous intraepithelial lesion on cytologic smear of cervix (LGSIL): Secondary | ICD-10-CM

## 2023-10-12 DIAGNOSIS — B002 Herpesviral gingivostomatitis and pharyngotonsillitis: Secondary | ICD-10-CM

## 2023-10-12 DIAGNOSIS — R87810 Cervical high risk human papillomavirus (HPV) DNA test positive: Secondary | ICD-10-CM | POA: Diagnosis not present

## 2023-10-12 DIAGNOSIS — Z3202 Encounter for pregnancy test, result negative: Secondary | ICD-10-CM

## 2023-10-12 LAB — POCT URINE PREGNANCY: Preg Test, Ur: NEGATIVE

## 2023-10-12 MED ORDER — VALACYCLOVIR HCL 1 G PO TABS
2000.0000 mg | ORAL_TABLET | Freq: Two times a day (BID) | ORAL | 6 refills | Status: AC
Start: 2023-10-12 — End: ?

## 2023-10-12 NOTE — Progress Notes (Unsigned)
    GYNECOLOGY OFFICE COLPOSCOPY PROCEDURE NOTE  38 y.o. [id number] here for colposcopy for low-grade squamous intraepithelial neoplasia (LGSIL - encompassing HPV,mild dysplasia,CIN I) with HR HPV pap smear on 07/2023. Discussed role for HPV in cervical dysplasia, need for surveillance.  Patient gave informed written consent, time out was performed.  Placed in lithotomy position. Cervix viewed with speculum and colposcope after application of acetic acid.   Colposcopy adequate? Yes  acetowhite lesion(s) noted at 5 o'clock; corresponding biopsies obtained.  ECC specimen obtained. All specimens were labeled and sent to pathology.  Chaperone was present during entire procedure.  Patient was given post procedure instructions.  Will follow up pathology and manage accordingly; patient will be contacted with results and recommendations.  Routine preventative health maintenance measures emphasized.  1. LGSIL on Pap smear of cervix (Primary) Now s/p uncomplicated colpo - POCT urine pregnancy - Surgical pathology  2. 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 Requesting treatment. Noted medication likely only needed for episodic outbreak if infrequent.  - valACYclovir (VALTREX) 1000 MG tablet; Take 2 tablets (2,000 mg total) by mouth 2 (two) times daily. For one day  Dispense: 4 tablet; Refill: 6    Lorriane Shire, MD, FACOG Minimally Invasive Gynecologic Surgery  Obstetrics and Gynecology, [facility] for [facility], [facility] 10/12/2023

## 2023-10-12 NOTE — Patient Instructions (Signed)
 Colposcopy, Care After  The following information offers guidance on how to care for yourself after your procedure. Your doctor may also give you more specific instructions. If you have problems or questions, contact your doctor. What can I expect after the procedure? If you did not have a sample of your tissue taken out (did not have a biopsy), you may only have some spotting of blood for a few days. You can go back to your normal activities. If you had a sample of your tissue taken out, it is common to have: Soreness and mild pain. These may last for a few days. Mild bleeding or fluid (discharge) coming from your vagina. The fluid will look dark and grainy. You may have this for a few days. The fluid may be caused by a liquid that was used during your procedure. You may need to wear a sanitary pad. Spotting of blood for at least 48 hours after the procedure. Follow these instructions at home: Medicines Take over-the-counter and prescription medicines only as told by your doctor. Ask your doctor what over-the-counter pain medicines and prescription medicines you can start taking again. This is very important if you take blood thinners. Activity For at least 3 days, or for as long as told by your doctor, avoid: Douching. Using tampons. Having sex. Return to your normal activities as told by your doctor. Ask your doctor what activities are safe for you. General instructions Ask your doctor if you may take baths, swim, or use a hot tub. You may take showers. If you use birth control (contraception), keep using it. Keep all follow-up visits. Contact a doctor if: You have a fever or chills. You faint or feel light-headed. Get help right away if: You bleed a lot from your vagina. A lot of bleeding means that the bleeding soaks through a pad in less than 1 hour. You have clumps of blood (blood clots) coming from your vagina. You have signs that could mean you have an infection. This may be  fluid coming from your vagina that is: Different than normal. Yellow. Bad-smelling. You have very bad pain or cramps in your lower belly that do not get better with medicine. Summary If you did not have a sample of your tissue taken out, you may only have some spotting of blood for a few days. You can go back to your normal activities. If you had a sample of your tissue taken out, it is common to have mild pain for a few days and spotting for 48 hours. Avoid douching, using tampons, and having sex for at least 3 days after the procedure or for as long as told. Get help right away if you have a lot of bleeding, very bad pain, or signs of infection. This information is not intended to replace advice given to you by your health care provider. Make sure you discuss any questions you have with your health care provider. Document Revised: 12/16/2020 Document Reviewed: 12/16/2020 Elsevier Patient Education  2024 [employer].

## 2023-10-13 LAB — SURGICAL PATHOLOGY

## 2023-10-14 ENCOUNTER — Telehealth: Payer: Self-pay

## 2023-10-14 NOTE — Telephone Encounter (Signed)
 Patient made aware of results per Dr. Briscoe Deutscher.  Patient will call back at a later date to schedule yearly pap. Danna Hefty [employer]

## 2023-10-14 NOTE — Telephone Encounter (Signed)
-----   Message from [place] sent at 10/13/2023  4:57 PM EDT ----- Notify that biopsy obtained was benign but endocervical sample did not have enough. Could consider repeat ECC, otherwise would recommend repeat pap in 1 year

## 2023-10-30 ENCOUNTER — Ambulatory Visit (INDEPENDENT_AMBULATORY_CARE_PROVIDER_SITE_OTHER): Admitting: Obstetrics and Gynecology

## 2023-10-30 VITALS — BP 115/79 | HR 78 | Ht 66.0 in | Wt 164.0 lb

## 2023-10-30 DIAGNOSIS — Z3046 Encounter for surveillance of implantable subdermal contraceptive: Secondary | ICD-10-CM

## 2023-10-30 DIAGNOSIS — F1994 Other psychoactive substance use, unspecified with psychoactive substance-induced mood disorder: Secondary | ICD-10-CM | POA: Diagnosis not present

## 2023-10-30 DIAGNOSIS — F411 Generalized anxiety disorder: Secondary | ICD-10-CM | POA: Diagnosis not present

## 2023-10-30 DIAGNOSIS — F3289 Other specified depressive episodes: Secondary | ICD-10-CM | POA: Diagnosis not present

## 2023-10-30 NOTE — Progress Notes (Signed)
 GYNECOLOGY VISIT  Patient name: Cynthia Odonnell MRN [id number]  Date of birth: 1985-09-15 Chief Complaint:   Gynecologic Exam (Has Nexplanon, been bleeding, very emotional.  )  History:  Cynthia Odonnell is a 38 y.o. [id number] being seen today for BTB on nexplanon x16 days.    Discussed the use of AI scribe software for clinical note transcription with the patient, who gave verbal consent to proceed.  History of Present Illness Cynthia Odonnell is a 38 year old female who presents with prolonged bleeding after Nexplanon insertion.  She has been experiencing heavy menstrual bleeding since around March 14th or 15th following the insertion of Nexplanon. The bleeding was initially heavy and has persisted. Prior to the insertion, she could predict her menstrual cycle due to mood swings, but now she experiences crying spells similar to being on her period. She is not sexually active and desires to have the Nexplanon removed due to the prolonged bleeding and emotional changes.  She has a history of bipolar disorder and schizoaffective disorder, which she attributes to drug use at the time of diagnosis. She has since stopped using drugs and feels that her emotions are resurfacing. She has not yet sought counseling despite acknowledging significant emotional distress, including crying and mood swings.  She recently obtained a job and is not currently sexually active. She has three men as her roommates and no female support, having lost her mother a year ago. She has a history of drug use but is currently abstinent.    Past Medical History:  Diagnosis Date   Abnormal Pap smear of cervix    Anxiety    Arthritis    Bronchitis    Hx - uses albuterol inhaler prn   Cocaine abuse, in remission (HCC)    Depression    GERD (gastroesophageal reflux disease)    zantac prn   H/O cesarean section 11/04/2014   Headache(784.0)    otc prn   Optic neuritis    left eye   Smoker    Suicide attempt [facility])     Termination of pregnancy (fetus)    x 2   Uterine scar from previous cesarean delivery, antepartum complication     Past Surgical History:  Procedure Laterality Date   CESAREAN SECTION  07/31/2008   [facility]   CESAREAN SECTION N/A 11/04/2014   Procedure: CESAREAN SECTION;  Surgeon: Tracey Harries, MD;  Location: [facility];  Service: Obstetrics;  Laterality: N/A;   COLPOSCOPY     WISDOM TOOTH EXTRACTION      The following portions of the patient's history were reviewed and updated as appropriate: allergies, current medications, past family history, past medical history, past social history, past surgical history and problem list.   Health Maintenance:   Last pap     Component Value Date/Time   DIAGPAP - Low grade squamous intraepithelial lesion (LSIL) (A) 07/13/2023 1055   HPVHIGH Positive (A) 07/13/2023 1055   ADEQPAP  07/13/2023 1055    Satisfactory for evaluation; transformation zone component PRESENT.   COLPO (10/2023) negative for dysplasia, ECC w/o epithelium  Last mammogram: n/a   Review of Systems:  Pertinent items are noted in HPI. Comprehensive review of systems was otherwise negative.   Objective:  Physical Exam BP 115/79   Pulse 78   Ht 5\' 6"  (1.676 m)   Wt 164 lb (74.4 kg)   LMP 10/18/2023   BMI 26.47 kg/m    Physical Exam Vitals and nursing note reviewed.  Constitutional:  Appearance: Normal appearance.  HENT:     Head: Normocephalic and atraumatic.  Pulmonary:     Effort: Pulmonary effort is normal.  Skin:    General: Skin is warm and dry.  Neurological:     General: No focal deficit present.     Mental Status: She is alert.  Psychiatric:        Mood and Affect: Mood normal.        Behavior: Behavior normal.        Thought Content: Thought content normal.        Judgment: Judgment normal.     NEXPLANON REMOVAL The risks (including infection, bleeding, pain, and uterine perforation) and benefits of the procedure were explained to the patient and  Written informed consent was obtained.   Device was palpated in left upper arm. After time out, the skin was cleaned with alcohol and infiltrated with 1cc of 1% lidocaine with epinephrine was used to infiltrate the skin and subcutaneous tissue deep to the device. The area was cleaned with betadine x3.  Using an 11 blade, the skin was incised and the implant was removed intact.  The implant was shown to the patient.  The skin was cleaned, incision covered with Steri-Strips, and an adhesive bandage.  Arm was wrapped and post procedure instructions provided to the patient.  Abstinence chose as new contraceptive method.      Assessment & Plan:   Assessment & Plan Prolonged bleeding due to Nexplanon   Experiencing prolonged bleeding since March 14th or 15th, a known side effect of Nexplanon, with heavy bleeding that continues. This can occur within the first three to six months of Nexplanon use. Prefers to have the Nexplanon removed due to dissatisfaction with current symptoms and emotional side effects.   - Now s/p uncomplicated Nexplanon removal  Emotional distress   Reports emotional distress, including frequent crying and feeling overwhelmed. Has drug use and previous diagnoses of bipolar disorder and schizoaffective disorder, questioning her accuracy as they were made during drug use. Not currently seeing a counselor or therapist but acknowledges potential benefit. Declines medication for mental health concerns, preferring to start with counseling.   - Refer to behavioral health specialist for counseling   Routine preventative health maintenance measures emphasized.  Lorriane Shire, MD [facility] for [facility], [facility]

## 2023-11-02 ENCOUNTER — Other Ambulatory Visit: Payer: Self-pay

## 2023-11-02 ENCOUNTER — Emergency Department (HOSPITAL_BASED_OUTPATIENT_CLINIC_OR_DEPARTMENT_OTHER)
Admission: EM | Admit: 2023-11-02 | Discharge: 2023-11-02 | Disposition: A | Discharge status: Home / Self Care | Attending: Emergency Medicine | Admitting: Emergency Medicine

## 2023-11-02 DIAGNOSIS — N939 Abnormal uterine and vaginal bleeding, unspecified: Secondary | ICD-10-CM | POA: Insufficient documentation

## 2023-11-02 DIAGNOSIS — Z91040 Latex allergy status: Secondary | ICD-10-CM | POA: Insufficient documentation

## 2023-11-02 DIAGNOSIS — D649 Anemia, unspecified: Secondary | ICD-10-CM | POA: Insufficient documentation

## 2023-11-02 LAB — BASIC METABOLIC PANEL WITH GFR
Anion gap: 8 (ref 5–15)
BUN: 14 mg/dL (ref 6–20)
CO2: 23 mmol/L (ref 22–32)
Calcium: 8.4 mg/dL — ABNORMAL LOW (ref 8.9–10.3)
Chloride: 107 mmol/L (ref 98–111)
Creatinine, Ser: 0.72 mg/dL (ref 0.44–1.00)
GFR, Estimated: >60 mL/min (ref >60)
Glucose, Bld: 102 mg/dL — ABNORMAL HIGH (ref 70–99)
Potassium: 3.8 mmol/L (ref 3.5–5.1)
Sodium: 138 mmol/L (ref 135–145)

## 2023-11-02 LAB — CBC
HCT: 32.9 % — ABNORMAL LOW (ref 36.0–46.0)
Hemoglobin: 11.3 g/dL — ABNORMAL LOW (ref 12.0–15.0)
MCH: 27.3 pg (ref 26.0–34.0)
MCHC: 34.3 g/dL (ref 30.0–36.0)
MCV: 79.5 fL — ABNORMAL LOW (ref 80.0–100.0)
Platelets: 223 10*3/uL (ref 150–400)
RBC: 4.14 MIL/uL (ref 3.87–5.11)
RDW: 16.3 % — ABNORMAL HIGH (ref 11.5–15.5)
WBC: 5.7 10*3/uL (ref 4.0–10.5)
nRBC: 0 % (ref 0.0–0.2)

## 2023-11-02 MED ORDER — FERROUS SULFATE 325 (65 FE) MG PO TABS: 325.0000 mg | ORAL_TABLET | Freq: Every day | ORAL | 0 refills | Status: AC

## 2023-11-02 NOTE — Discharge Instructions (Addendum)
 Your hemoglobin was decreased at 11.3.  Fortunately this can be treated with iron supplements.  Follow-up with your OB/GYN doctor to be rechecked.  Return as needed for worsening symptoms

## 2023-11-02 NOTE — ED Triage Notes (Addendum)
 Pt states she went to the doctor on Friday and had her Nexplanon removed. Prior to having Nexplanon removed, pt had been bleeding for 2 weeks. Bleeding is now heavier and pt feels dizzy. Pt has been through 3 tampons since 0800 this am.   Pt also states that she is having cramps in her right hamstring that also started 2 weeks ago. Pt current smoker, unclear about prior hx of blood clots.  Robina Ade, RN

## 2023-11-02 NOTE — ED Provider Notes (Signed)
 [place] [facility] AT [facility] Provider Note   CSN: [id number] Arrival date & time: 11/02/23  1013     History  Chief Complaint  Patient presents with   Vaginal Bleeding    Cynthia Odonnell is a 38 y.o. female.   Vaginal Bleeding    The patient has been having issues with irregular menstrual bleeding.  Patient states she had her Nexplanon removed on feb 28th.  Patient states over the last couple weeks she has been having trouble with irregular menstrual bleeding.  Patient did have a colposcopy procedure on March 10.  Patient states she started to feel somewhat lightheaded today.  She someone told her she looked pale.  She was concerned that her blood count was low.  Patient states that she is feeling lightheaded when she stands up she is feels a pulling sensation as if she is being drawn into the ground.  She denies any focal numbness or weakness.  No fevers or chills.  No abdominal pain. Home Medications Prior to Admission medications   Medication Sig Start Date End Date Taking? Authorizing Provider  ferrous sulfate 325 (65 FE) MG tablet Take 1 tablet (325 mg total) by mouth daily. 11/02/23  Yes Linwood Dibbles, MD  benzonatate (TESSALON) 100 MG capsule Take 1 capsule (100 mg total) by mouth every 8 (eight) hours. Patient not taking: Reported on 09/11/2023 07/09/23   Dolphus Jenny, PA-C  clindamycin (CLEOCIN) 150 MG capsule Take 150 mg by mouth 4 (four) times daily. Patient not taking: Reported on 10/30/2023 09/07/23   [provider]  diclofenac Sodium (VOLTAREN) 1 % GEL Apply 4 g topically 4 (four) times daily. Patient not taking: Reported on 09/11/2023 07/15/23   Salvatore Decent, FNP  doxycycline (VIBRA-TABS) 100 MG tablet  07/17/23   [provider]  methocarbamol (ROBAXIN) 500 MG tablet Take 1 tablet (500 mg total) by mouth every 8 (eight) hours as needed for muscle spasms. Patient not taking: Reported on 09/11/2023 07/13/23   Salvatore Decent, FNP   triamcinolone cream (KENALOG) 0.1 % Apply 1 Application topically 2 (two) times daily. Patient not taking: Reported on 09/11/2023 07/13/23   Salvatore Decent, FNP  valACYclovir (VALTREX) 1000 MG tablet Take 2 tablets (2,000 mg total) by mouth 2 (two) times daily. For one day 10/12/23   Lorriane Shire, MD      Allergies    Abilify [aripiprazole], Amoxicillin, Asenapine, Codeine, Latex, Penicillins, Prednisone, Augmentin [amoxicillin-pot clavulanate], Lamictal [lamotrigine], and Tramadol    Review of Systems   Review of Systems  Genitourinary:  Positive for vaginal bleeding.    Physical Exam Updated Vital Signs BP 111/78   Pulse (!) 56   Temp 97.8 F (36.6 C) (Oral)   Resp 18   Ht 1.676 m (5\' 6" )   Wt 74.4 kg   LMP 10/18/2023   SpO2 98%   BMI 26.47 kg/m  Physical Exam Vitals and nursing note reviewed.  Constitutional:      General: She is not in acute distress.    Appearance: She is well-developed.  HENT:     Head: Normocephalic and atraumatic.     Right Ear: External ear normal.     Left Ear: External ear normal.  Eyes:     General: No scleral icterus.       Right eye: No discharge.        Left eye: No discharge.     Conjunctiva/sclera: Conjunctivae normal.  Neck:     Trachea: No tracheal  deviation.  Cardiovascular:     Rate and Rhythm: Normal rate and regular rhythm.  Pulmonary:     Effort: Pulmonary effort is normal. No respiratory distress.     Breath sounds: Normal breath sounds. No stridor. No wheezing or rales.  Abdominal:     General: Bowel sounds are normal. There is no distension.     Palpations: Abdomen is soft.     Tenderness: There is no abdominal tenderness. There is no guarding or rebound.  Musculoskeletal:        General: No tenderness or deformity.     Cervical back: Neck supple.  Skin:    General: Skin is warm and dry.     Findings: No rash.  Neurological:     General: No focal deficit present.     Mental Status: She is alert.     Cranial  Nerves: No cranial nerve deficit, dysarthria or facial asymmetry.     Sensory: No sensory deficit.     Motor: No abnormal muscle tone or seizure activity.     Coordination: Coordination normal.  Psychiatric:        Mood and Affect: Mood normal.     ED Results / Procedures / Treatments   Labs (all labs ordered are listed, but only abnormal results are displayed) Labs Reviewed  CBC - Abnormal; Notable for the following components:      Result Value   Hemoglobin 11.3 (*)    HCT 32.9 (*)    MCV 79.5 (*)    RDW 16.3 (*)    All other components within normal limits  BASIC METABOLIC PANEL WITH GFR - Abnormal; Notable for the following components:   Glucose, Bld 102 (*)    Calcium 8.4 (*)    All other components within normal limits    EKG None  Radiology No results found.  Procedures Procedures    Medications Ordered in ED Medications - No data to display  ED Course/ Medical Decision Making/ A&P Clinical Course as of 11/02/23 1153  Mon Nov 02, 2023  1132 CBC(!) Hemoglobin slightly decreased at 11.3.  Metabolic panel normal. [JK]  1145 Metabolic panel unremarkable.  Hemoglobin slightly decreased at 11.3 [JK]    Clinical Course User Index [JK] Linwood Dibbles, MD                                 Medical Decision Making Problems Addressed: Anemia, unspecified type: acute illness or injury that poses a threat to life or bodily functions Vaginal bleeding: acute illness or injury  Amount and/or Complexity of Data Reviewed Labs: ordered. Decision-making details documented in ED Course.  Risk OTC drugs.   Patient presents to the ED for evaluation of feeling lightheaded.  Patient has been having bleeding since the 14th or 15th.  This occurred after insertion of her Nexplanon.  Patient saw her OB/GYN doctor and had it removed on the 28th.  Patient was concerned her blood count was low as she has been feeling lightheaded.  Patient's hemoglobin is slightly decreased today.   However no signs of severe anemia requiring blood transfusion or emergent treatment.  Patient's not having any heavy bleeding at this time.  Recommend outpatient follow-up with OB/GYN.  Will have her start taking iron supplements.       Final Clinical Impression(s) / ED Diagnoses Final diagnoses:  Vaginal bleeding  Anemia, unspecified type    Rx / DC Orders ED Discharge Orders  Ordered    ferrous sulfate 325 (65 FE) MG tablet  Daily        11/02/23 1150              Linwood Dibbles, MD 11/02/23 1155

## 2023-12-04 NOTE — BH Specialist Note (Unsigned)
 Pt did not arrive to video visit and did not answer the phone; Left HIPPA-compliant message to call back Carolyn Cisco from [employer] for [employer] at [facility] at  [phone] [facility] office).  ; left MyChart message for patient.

## 2023-12-16 ENCOUNTER — Ambulatory Visit: Payer: MEDICAID | Admitting: Clinical

## 2024-01-18 ENCOUNTER — Ambulatory Visit: Payer: MEDICAID | Admitting: Internal Medicine

## 2024-02-01 ENCOUNTER — Ambulatory Visit: Payer: MEDICAID | Admitting: Internal Medicine

## 2024-02-24 ENCOUNTER — Other Ambulatory Visit (HOSPITAL_BASED_OUTPATIENT_CLINIC_OR_DEPARTMENT_OTHER): Payer: Self-pay

## 2024-02-24 ENCOUNTER — Other Ambulatory Visit: Payer: Self-pay

## 2024-02-24 ENCOUNTER — Encounter (HOSPITAL_BASED_OUTPATIENT_CLINIC_OR_DEPARTMENT_OTHER): Payer: Self-pay | Admitting: Emergency Medicine

## 2024-02-24 ENCOUNTER — Emergency Department (HOSPITAL_BASED_OUTPATIENT_CLINIC_OR_DEPARTMENT_OTHER)
Admission: EM | Admit: 2024-02-24 | Discharge: 2024-02-24 | Disposition: A | Discharge status: Home / Self Care | Attending: Emergency Medicine | Admitting: Emergency Medicine

## 2024-02-24 DIAGNOSIS — Z91040 Latex allergy status: Secondary | ICD-10-CM | POA: Diagnosis not present

## 2024-02-24 DIAGNOSIS — R059 Cough, unspecified: Secondary | ICD-10-CM | POA: Diagnosis present

## 2024-02-24 DIAGNOSIS — J069 Acute upper respiratory infection, unspecified: Secondary | ICD-10-CM | POA: Insufficient documentation

## 2024-02-24 LAB — RESP PANEL BY RT-PCR (RSV, FLU A&B, COVID)  RVPGX2
Influenza A by PCR: NEGATIVE
Influenza B by PCR: NEGATIVE
Resp Syncytial Virus by PCR: NEGATIVE
SARS Coronavirus 2 by RT PCR: NEGATIVE

## 2024-02-24 MED ORDER — IPRATROPIUM BROMIDE 0.03 % NA SOLN
2.0000 | Freq: Two times a day (BID) | NASAL | 0 refills | Status: DC
  Filled 2024-02-24: qty 30, 75d supply, fill #0

## 2024-02-24 MED ORDER — BENZONATATE 100 MG PO CAPS
100.0000 mg | ORAL_CAPSULE | Freq: Three times a day (TID) | ORAL | 0 refills | Status: DC
  Filled 2024-02-24: qty 21, 7d supply, fill #0

## 2024-02-24 MED ORDER — NAPROXEN 375 MG PO TABS
375.0000 mg | ORAL_TABLET | Freq: Two times a day (BID) | ORAL | 0 refills | Status: DC
  Filled 2024-02-24: qty 10, 5d supply, fill #0

## 2024-02-24 MED ORDER — IPRATROPIUM BROMIDE 0.03 % NA SOLN: 2.0000 | Freq: Two times a day (BID) | NASAL | 0 refills | Status: AC

## 2024-02-24 MED ORDER — NAPROXEN 375 MG PO TABS: 375.0000 mg | ORAL_TABLET | Freq: Two times a day (BID) | ORAL | 0 refills | Status: AC

## 2024-02-24 MED ORDER — BENZONATATE 100 MG PO CAPS: 100.0000 mg | ORAL_CAPSULE | Freq: Three times a day (TID) | ORAL | 0 refills | Status: AC

## 2024-02-24 MED ORDER — NAPROXEN 250 MG PO TABS
500.0000 mg | ORAL_TABLET | Freq: Once | ORAL | Status: AC
  Administered 2024-02-24: 500 mg via ORAL
  Filled 2024-02-24: qty 2

## 2024-02-24 NOTE — ED Provider Notes (Signed)
 [place] [facility] Provider Note   CSN: [id number] Arrival date & time: 02/24/24  9382     Patient presents with: URI   Cynthia Odonnell is a 38 y.o. female.   The history is provided by the patient.  URI Presenting symptoms: congestion, cough and fever   Presenting symptoms: no sore throat   Presenting symptoms comment:  Body aches  Severity:  Moderate Onset quality:  Gradual Duration:  3 days Timing:  Constant Progression:  Unchanged Chronicity:  New Relieved by:  Nothing Worsened by:  Nothing Ineffective treatments: nyquil. Associated symptoms: myalgias   Associated symptoms: no arthralgias, no neck pain and no wheezing   Risk factors: not elderly, no chronic cardiac disease, no chronic kidney disease and no chronic respiratory disease        Prior to Admission medications   Medication Sig Start Date End Date Taking? Authorizing Provider  ipratropium (ATROVENT ) 0.03 % nasal spray Place 2 sprays into both nostrils every 12 (twelve) hours. 02/24/24  Yes Xolani Degracia, MD  naproxen  (NAPROSYN ) 375 MG tablet Take 1 tablet (375 mg total) by mouth 2 (two) times daily with a meal. 02/24/24  Yes Terreon Ekholm, MD  benzonatate  (TESSALON ) 100 MG capsule Take 1 capsule (100 mg total) by mouth every 8 (eight) hours. 02/24/24   Kary Colaizzi, MD  clindamycin  (CLEOCIN ) 150 MG capsule Take 150 mg by mouth 4 (four) times daily. Patient not taking: Reported on 10/30/2023 09/07/23   [provider]  diclofenac  Sodium (VOLTAREN ) 1 % GEL Apply 4 g topically 4 (four) times daily. Patient not taking: Reported on 09/11/2023 07/15/23   Billy Knee, FNP  doxycycline  (VIBRA -TABS) 100 MG tablet  07/17/23   [provider]  ferrous sulfate  325 (65 FE) MG tablet Take 1 tablet (325 mg total) by mouth daily. 11/02/23   Randol Simmonds, MD  methocarbamol  (ROBAXIN ) 500 MG tablet Take 1 tablet (500 mg total) by mouth every 8 (eight) hours as needed for  muscle spasms. Patient not taking: Reported on 09/11/2023 07/13/23   Billy Knee, FNP  triamcinolone  cream (KENALOG ) 0.1 % Apply 1 Application topically 2 (two) times daily. Patient not taking: Reported on 09/11/2023 07/13/23   Billy Knee, FNP  valACYclovir  (VALTREX ) 1000 MG tablet Take 2 tablets (2,000 mg total) by mouth 2 (two) times daily. For one day 10/12/23   Ajewole, Christana, MD    Allergies: Abilify  [aripiprazole ], Amoxicillin, Asenapine, Codeine, Latex, Penicillins, Prednisone , Augmentin [amoxicillin-pot clavulanate], Lamictal [lamotrigine], and Tramadol    Review of Systems  Constitutional:  Positive for fever.  HENT:  Positive for congestion. Negative for facial swelling and sore throat.   Respiratory:  Positive for cough. Negative for wheezing and stridor.   Cardiovascular:  Negative for chest pain.  Musculoskeletal:  Positive for myalgias. Negative for arthralgias, neck pain and neck stiffness.  All other systems reviewed and are negative.   Updated Vital Signs BP (!) 126/105 (BP Location: Right Arm)   Pulse 67   Temp 98.1 F (36.7 C) (Oral)   Resp 20   Ht 5' 6 (1.676 m)   Wt 72.4 kg   LMP 02/17/2024 (Approximate)   SpO2 100%   BMI 25.76 kg/m   Physical Exam Vitals and nursing note reviewed.  Constitutional:      General: She is not in acute distress.    Appearance: Normal appearance. She is well-developed.  HENT:     Head: Normocephalic and atraumatic.     Nose: Congestion  present.  Eyes:     Pupils: Pupils are equal, round, and reactive to light.  Cardiovascular:     Rate and Rhythm: Normal rate and regular rhythm.     Pulses: Normal pulses.     Heart sounds: Normal heart sounds.  Pulmonary:     Effort: Pulmonary effort is normal. No respiratory distress.     Breath sounds: Normal breath sounds.  Abdominal:     General: Bowel sounds are normal. There is no distension.     Palpations: Abdomen is soft.     Tenderness: There is no abdominal tenderness.  There is no guarding or rebound.  Genitourinary:    Vagina: No vaginal discharge.  Musculoskeletal:        General: Normal range of motion.     Cervical back: Normal range of motion and neck supple.  Skin:    General: Skin is warm and dry.     Capillary Refill: Capillary refill takes less than 2 seconds.     Findings: No erythema or rash.  Neurological:     General: No focal deficit present.     Mental Status: She is alert and oriented to person, place, and time.     Deep Tendon Reflexes: Reflexes normal.  Psychiatric:        Mood and Affect: Mood normal.     (all labs ordered are listed, but only abnormal results are displayed) Labs Reviewed  RESP PANEL BY RT-PCR (RSV, FLU A&B, COVID)  [id number]    EKG: None  Radiology: No results found.   Procedures   Medications Ordered in the ED  naproxen  (NAPROSYN ) tablet 500 mg (has no administration in time range)                                    Medical Decision Making Amount and/or Complexity of Data Reviewed External Data Reviewed: notes.    Details: Previous notes reviewed  Labs: ordered.  Risk Prescription drug management. Risk Details: Very well appearing.  Normal vitals.  Patient will check mychart for results.  Stable for discharge with close follow up      Final diagnoses:  Upper respiratory tract infection, unspecified type  No signs of systemic illness or infection. The patient is nontoxic-appearing on exam and vital signs are within normal limits.  I have reviewed the triage vital signs and the nursing notes. Pertinent labs & imaging results that were available during my care of the patient were reviewed by me and considered in my medical decision making (see chart for details). After history, exam, and medical workup I feel the patient has been appropriately medically screened and is safe for discharge home. Pertinent diagnoses were discussed with the patient. Patient was given return precautions.     ED  Discharge Orders          Ordered    naproxen  (NAPROSYN ) 375 MG tablet  2 times daily with meals        02/24/24 0631    ipratropium (ATROVENT ) 0.03 % nasal spray  Every 12 hours        02/24/24 0631    benzonatate  (TESSALON ) 100 MG capsule  Every 8 hours        02/24/24 0631               Wallis Spizzirri, MD 02/24/24 9365

## 2024-02-24 NOTE — ED Triage Notes (Signed)
 Pt states [place] a water park on Sunday and believes got sick. URI Sx with body aches.

## 2024-03-13 ENCOUNTER — Encounter (HOSPITAL_BASED_OUTPATIENT_CLINIC_OR_DEPARTMENT_OTHER): Payer: Self-pay

## 2024-03-13 ENCOUNTER — Emergency Department (HOSPITAL_BASED_OUTPATIENT_CLINIC_OR_DEPARTMENT_OTHER)
Admission: EM | Admit: 2024-03-13 | Discharge: 2024-03-13 | Disposition: A | Discharge status: Home / Self Care | Attending: Emergency Medicine | Admitting: Emergency Medicine

## 2024-03-13 DIAGNOSIS — H11433 Conjunctival hyperemia, bilateral: Secondary | ICD-10-CM | POA: Diagnosis not present

## 2024-03-13 DIAGNOSIS — Z91040 Latex allergy status: Secondary | ICD-10-CM | POA: Insufficient documentation

## 2024-03-13 DIAGNOSIS — H1033 Unspecified acute conjunctivitis, bilateral: Secondary | ICD-10-CM

## 2024-03-13 DIAGNOSIS — H5789 Other specified disorders of eye and adnexa: Secondary | ICD-10-CM | POA: Diagnosis present

## 2024-03-13 MED ORDER — OLOPATADINE HCL 0.2 % OP SOLN: 1.0000 [drp] | Freq: Two times a day (BID) | OPHTHALMIC | 0 refills | Status: DC

## 2024-03-13 MED ORDER — FLUORESCEIN SODIUM 1 MG OP STRP
1.0000 | ORAL_STRIP | Freq: Once | OPHTHALMIC | Status: AC
  Administered 2024-03-13: 1 via OPHTHALMIC
  Filled 2024-03-13: qty 1

## 2024-03-13 MED ORDER — POLYMYXIN B-TRIMETHOPRIM 10000-0.1 UNIT/ML-% OP SOLN
1.0000 [drp] | OPHTHALMIC | 0 refills | Status: DC
Start: 2024-03-13 — End: 2024-03-13

## 2024-03-13 MED ORDER — POLYMYXIN B-TRIMETHOPRIM 10000-0.1 UNIT/ML-% OP SOLN: 1.0000 [drp] | OPHTHALMIC | 0 refills | Status: AC

## 2024-03-13 MED ORDER — CEFUROXIME AXETIL 500 MG PO TABS: 500.0000 mg | ORAL_TABLET | Freq: Two times a day (BID) | ORAL | 0 refills | Status: DC

## 2024-03-13 MED ORDER — OLOPATADINE HCL 0.2 % OP SOLN: 1.0000 [drp] | Freq: Two times a day (BID) | OPHTHALMIC | 0 refills | Status: AC

## 2024-03-13 MED ORDER — CEFUROXIME AXETIL 500 MG PO TABS: 500.0000 mg | ORAL_TABLET | Freq: Two times a day (BID) | ORAL | 0 refills | Status: AC

## 2024-03-13 MED ORDER — TETRACAINE HCL 0.5 % OP SOLN
2.0000 [drp] | Freq: Once | OPHTHALMIC | Status: AC
  Administered 2024-03-13: 2 [drp] via OPHTHALMIC
  Filled 2024-03-13: qty 4

## 2024-03-13 NOTE — ED Notes (Signed)
 Walked into the patient's room to provide work note and found patient going through the cabinet with IV/fluid supplies. Patient immediately walked away from cabinets and grabbed her stuff. The eyedrops that were on the counter prior to me leaving to grab the work note were missing. I informed the patient that she would need to give the medicated eye drops back, as they were not for her to take. Pt handed over eye drops, and denied having anything else on their person. Pt discharged home.

## 2024-03-13 NOTE — ED Triage Notes (Signed)
 Pt states that she has pink eye and it is spreading to her left eye. 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 States that her son has Mono and not sure if this is related to that.

## 2024-03-13 NOTE — Discharge Instructions (Signed)
 Follow-up with the ophthalmologist as discussed.  Return to emergency room if you have any worsening symptoms.

## 2024-03-13 NOTE — ED Provider Notes (Addendum)
 [place] [facility] Provider Note   CSN: [id number] Arrival date & time: 03/13/24  9196     Patient presents with: Conjunctivitis   Cynthia Odonnell is a 38 y.o. female.   Patient is a 38 year old female who presents with possible pinkeye.  She has had about a 2 to 3-day history of some burning and itching in her eyes.  She recently was treated for a minor respiratory infection but says overall those symptoms have improved.  She still has a little bit of a cough but she smokes cigarettes and not sure if it is related to that.  She denies any fevers.  She has had some itching and burning of her eyes for the last few days.  She does not wear contacts.  No drainage from her eyes.  She has a little bit of blurry vision related to this.  She denies any crusting of her eyes.  No rashes.       Prior to Admission medications   Medication Sig Start Date End Date Taking? Authorizing Provider  benzonatate  (TESSALON ) 100 MG capsule Take 1 capsule (100 mg total) by mouth every 8 (eight) hours. 02/24/24   Palumbo, April, MD  cefUROXime  (CEFTIN ) 500 MG tablet Take 1 tablet (500 mg total) by mouth 2 (two) times daily with a meal. 03/13/24   Beverley Leita LABOR, PA-C  clindamycin  (CLEOCIN ) 150 MG capsule Take 150 mg by mouth 4 (four) times daily. Patient not taking: Reported on 10/30/2023 09/07/23   [provider]  diclofenac  Sodium (VOLTAREN ) 1 % GEL Apply 4 g topically 4 (four) times daily. Patient not taking: Reported on 09/11/2023 07/15/23   Billy Knee, FNP  doxycycline  (VIBRA -TABS) 100 MG tablet  07/17/23   [provider]  ferrous sulfate  325 (65 FE) MG tablet Take 1 tablet (325 mg total) by mouth daily. 11/02/23   Randol Simmonds, MD  ipratropium (ATROVENT ) 0.03 % nasal spray Place 2 sprays into both nostrils every 12 (twelve) hours. 02/24/24   Palumbo, April, MD  methocarbamol  (ROBAXIN ) 500 MG tablet Take 1 tablet (500 mg total) by mouth every 8 (eight)  hours as needed for muscle spasms. Patient not taking: Reported on 09/11/2023 07/13/23   Billy Knee, FNP  naproxen  (NAPROSYN ) 375 MG tablet Take 1 tablet (375 mg total) by mouth 2 (two) times daily with a meal. 02/24/24   Palumbo, April, MD  Olopatadine  HCl 0.2 % SOLN Apply 1 drop to eye 2 (two) times daily. 03/13/24   Beverley Leita LABOR, PA-C  triamcinolone  cream (KENALOG ) 0.1 % Apply 1 Application topically 2 (two) times daily. Patient not taking: Reported on 09/11/2023 07/13/23   Billy Knee, FNP  trimethoprim -polymyxin b  (POLYTRIM ) ophthalmic solution Place 1 drop into both eyes every 4 (four) hours. 03/13/24   Beverley Leita LABOR, PA-C  valACYclovir  (VALTREX ) 1000 MG tablet Take 2 tablets (2,000 mg total) by mouth 2 (two) times daily. For one day 10/12/23   Ajewole, Christana, MD    Allergies: Abilify  [aripiprazole ], Amoxicillin, Asenapine, Codeine, Latex, Penicillins, Prednisone , Augmentin [amoxicillin-pot clavulanate], Lamictal [lamotrigine], and Tramadol    Review of Systems  Constitutional:  Negative for fatigue and fever.  HENT:  Negative for congestion and rhinorrhea.   Eyes:  Positive for pain, redness, itching and visual disturbance. Negative for photophobia and discharge.  Respiratory:  Negative for shortness of breath.   Gastrointestinal:  Negative for nausea and vomiting.  Skin:  Negative for rash.  Neurological:  Negative for headaches.  Updated Vital Signs BP (!) 148/90   Pulse 78   Temp 97.7 F (36.5 C) (Oral)   Resp 18   Ht 5' 6 (1.676 m)   Wt 72.6 kg   LMP 02/17/2024 (Approximate)   SpO2 100%   BMI 25.82 kg/m   Physical Exam Constitutional:      Appearance: She is well-developed.  HENT:     Head: Normocephalic and atraumatic.  Eyes:     Extraocular Movements: Extraocular movements intact.     Pupils: Pupils are equal, round, and reactive to light.     Comments: Minor injection of the conjunctive a bilaterally.  No periorbital redness.  Mild swelling of the upper  eyelid, no foreign bodies noted, no fluorescein  uptake on Woods lamp exam, visual acuity was 20/30 in both eyes  Cardiovascular:     Rate and Rhythm: Normal rate.  Pulmonary:     Effort: Pulmonary effort is normal.  Musculoskeletal:        General: No tenderness.     Cervical back: Normal range of motion and neck supple.  Skin:    General: Skin is warm and dry.  Neurological:     Mental Status: She is alert and oriented to person, place, and time.     (all labs ordered are listed, but only abnormal results are displayed) Labs Reviewed - No data to display  EKG: None  Radiology: No results found.   Procedures   Medications Ordered in the ED  fluorescein  ophthalmic strip 1 strip (1 strip Both Eyes Given by Other 03/13/24 0907)  tetracaine  (PONTOCAINE) 0.5 % ophthalmic solution 2 drop (2 drops Both Eyes Given by Other 03/13/24 0907)                                    Medical Decision Making Risk Prescription drug management.   Patient is a 37 year old who presents with itching and irritation of both of her eyes.  She is concerned about conjunctivitis.  Her eyes are mildly erythematous.  She has some mild lid swelling.  It almost sounds more allergic with itching and irritation.  She does not have any crusting or purulent drainage.  I do not see any evidence of corneal abrasions.  She does not have any pain or vision loss that would be more concerning for other etiology such as glaucoma.  She does not have any recent trauma to her eyes or chemical exposures.  She does not wear contacts.  Visual acuity shows only mild changes.  She was discharged home in good condition.  Will treat her for possible conjunctivitis with Polytrim  drops as well as oral antibiotics given her mild lid swelling.  Will also start antihistamine drops.  Will give a referral to follow-up with ophthalmology for complete eye exam.  She was discharged home in good condition.  Return precautions were given.      Final diagnoses:  Acute conjunctivitis of both eyes, unspecified acute conjunctivitis type    ED Discharge Orders          Ordered    Olopatadine  HCl 0.2 % SOLN  2 times daily,   Status:  Discontinued        03/13/24 1000    trimethoprim -polymyxin b  (POLYTRIM ) ophthalmic solution  Every 4 hours,   Status:  Discontinued        03/13/24 1000    cefUROXime  (CEFTIN ) 500 MG tablet  2 times daily  with meals,   Status:  Discontinued        03/13/24 1000    Olopatadine  HCl 0.2 % SOLN  2 times daily        03/13/24 1033    trimethoprim -polymyxin b  (POLYTRIM ) ophthalmic solution  Every 4 hours        03/13/24 1033    cefUROXime  (CEFTIN ) 500 MG tablet  2 times daily with meals        03/13/24 1033               Lenor Hollering, MD 03/13/24 1020    Lenor Hollering, MD 03/13/24 1045

## 2024-04-06 ENCOUNTER — Other Ambulatory Visit (HOSPITAL_COMMUNITY)
Admission: RE | Admit: 2024-04-06 | Discharge: 2024-04-06 | Disposition: A | Discharge status: Home / Self Care | Source: Ambulatory Visit | Attending: Obstetrics and Gynecology | Admitting: Obstetrics and Gynecology

## 2024-04-06 ENCOUNTER — Ambulatory Visit (INDEPENDENT_AMBULATORY_CARE_PROVIDER_SITE_OTHER): Admitting: Obstetrics and Gynecology

## 2024-04-06 VITALS — BP 101/66 | HR 59 | Wt 161.0 lb

## 2024-04-06 DIAGNOSIS — R102 Pelvic and perineal pain: Secondary | ICD-10-CM | POA: Insufficient documentation

## 2024-04-06 LAB — POCT URINALYSIS DIPSTICK
Bilirubin, UA: NEGATIVE
Blood, UA: NEGATIVE
Glucose, UA: NEGATIVE
Ketones, UA: NEGATIVE
Nitrite, UA: NEGATIVE
Protein, UA: NEGATIVE
Spec Grav, UA: 1.01 (ref 1.010–1.025)
Urobilinogen, UA: 0.2 U/dL
pH, UA: 8 (ref 5.0–8.0)

## 2024-04-06 LAB — POCT URINE PREGNANCY: Preg Test, Ur: NEGATIVE

## 2024-04-06 NOTE — Progress Notes (Signed)
   ESTABLISHED GYNECOLOGY VISIT Chief Complaint  Patient presents with   Pelvic Pain    Subjective:  Cynthia Odonnell is a 38 y.o. [id number] presenting for pelvic pain.  Reports occasional pains in her pelvis on both sides. Comes/goes. Recently lost a lot of weight and changed her diet, unsure if it is GI in origin.  Also reports some vaginal discharge.    Review of Systems:   Pertinent items are noted in HPI  Pertinent History Reviewed:  Reviewed past medical,surgical, social and family history.  Reviewed problem list, medications and allergies.  Objective:   Vitals:   04/06/24 1423  BP: 101/66  Pulse: (!) 59  Weight: 161 lb (73 kg)   Physical Examination:   General appearance - well appearing, and in no distress  Mental status - alert, oriented to person, place, and time  Psych:  normal mood and affect  Skin - warm and dry, normal color, no suspicious lesions noted  Abdomen - soft, nontender, nondistended, no masses or organomegaly  Pelvic -  VULVA: normal appearing vulva with no masses, tenderness or lesions   VAGINA: normal appearing vagina with normal color and discharge, no lesions   CERVIX: normal appearing cervix without discharge or lesions, no CMT   UTERUS: uterus is felt to be normal size, shape, consistency and nontender    ADNEXA: No adnexal masses or tenderness noted.  Extremities:  No swelling or varicosities noted  Chaperone present for exam  Assessment and Plan:  1. Pelvic pain (Primary) Exam reassuring. UPT neg. Will do UA, swab to r/o infection and ultrasound to evaluate further. If workup negative, consider GI sources - Cervicovaginal ancillary only( [place]) - US  PELVIC COMPLETE WITH TRANSVAGINAL; Future - POCT urine pregnancy - POCT urinalysis dipstick   Rollo ONEIDA Bring, MD, FACOG Obstetrician & Gynecologist, [facility], [facility]

## 2024-04-08 LAB — CERVICOVAGINAL ANCILLARY ONLY
Bacterial Vaginitis (gardnerella): NEGATIVE
Candida Glabrata: NEGATIVE
Candida Vaginitis: POSITIVE — AB
Chlamydia: NEGATIVE
Comment: NEGATIVE
Comment: NEGATIVE
Comment: NEGATIVE
Comment: NEGATIVE
Comment: NEGATIVE
Comment: NORMAL
Neisseria Gonorrhea: NEGATIVE
Trichomonas: NEGATIVE

## 2024-04-11 ENCOUNTER — Ambulatory Visit: Payer: Self-pay | Admitting: Obstetrics and Gynecology

## 2024-04-11 DIAGNOSIS — B3731 Acute candidiasis of vulva and vagina: Secondary | ICD-10-CM

## 2024-04-11 MED ORDER — FLUCONAZOLE 150 MG PO TABS: 150.0000 mg | ORAL_TABLET | Freq: Once | ORAL | 0 refills | Status: AC

## 2024-04-11 NOTE — Telephone Encounter (Signed)
 Patient made aware of results and that Rx was sent in.  Patient will pick up today.  Cynthia Odonnell

## 2024-04-11 NOTE — Telephone Encounter (Signed)
-----   Message from Rollo ONEIDA Bring sent at 04/11/2024  9:10 AM EDT ----- +yeast, rx sent. Please review with patient, no mychart. ----- Message ----- From: Tanda York CROME, CMA Sent: 04/06/2024   2:44 PM EDT To: Rollo ONEIDA Bring, MD

## 2024-04-14 ENCOUNTER — Ambulatory Visit (HOSPITAL_BASED_OUTPATIENT_CLINIC_OR_DEPARTMENT_OTHER): Payer: Self-pay

## 2024-06-07 ENCOUNTER — Telehealth: Payer: Self-pay

## 2024-06-07 NOTE — Telephone Encounter (Signed)
 Patient called stating she thinks she has a lost tampon inside of her. Advised patient to go to Urgent care to have it removed. Understanding was voiced. Adynn Caseres l Avalene Sealy, CMA
